# Patient Record
Sex: Female | Born: 1975 | Race: White | Hispanic: No | Marital: Married | Smoking: Former smoker
Health system: Southern US, Community
[De-identification: ages and names within clinical notes are randomized; demographics above are authoritative.]

## PROBLEM LIST (undated history)

## (undated) DIAGNOSIS — E785 Hyperlipidemia, unspecified: Secondary | ICD-10-CM

## (undated) DIAGNOSIS — M159 Polyosteoarthritis, unspecified: Secondary | ICD-10-CM

## (undated) DIAGNOSIS — E039 Hypothyroidism, unspecified: Secondary | ICD-10-CM

## (undated) DIAGNOSIS — E669 Obesity, unspecified: Secondary | ICD-10-CM

## (undated) DIAGNOSIS — F411 Generalized anxiety disorder: Secondary | ICD-10-CM

## (undated) DIAGNOSIS — R112 Nausea with vomiting, unspecified: Secondary | ICD-10-CM

## (undated) DIAGNOSIS — F329 Major depressive disorder, single episode, unspecified: Secondary | ICD-10-CM

## (undated) DIAGNOSIS — J189 Pneumonia, unspecified organism: Secondary | ICD-10-CM

## (undated) DIAGNOSIS — K219 Gastro-esophageal reflux disease without esophagitis: Secondary | ICD-10-CM

## (undated) DIAGNOSIS — T7840XA Allergy, unspecified, initial encounter: Secondary | ICD-10-CM

## (undated) DIAGNOSIS — Z9889 Other specified postprocedural states: Secondary | ICD-10-CM

## (undated) HISTORY — DX: Gastro-esophageal reflux disease without esophagitis: K21.9

## (undated) HISTORY — DX: Hyperlipidemia, unspecified: E78.5

## (undated) HISTORY — DX: Generalized anxiety disorder: F41.1

## (undated) HISTORY — DX: Hypothyroidism, unspecified: E03.9

## (undated) HISTORY — DX: Obesity, unspecified: E66.9

## (undated) HISTORY — DX: Polyosteoarthritis, unspecified: M15.9

## (undated) HISTORY — PX: JOINT REPLACEMENT: SHX530

## (undated) HISTORY — PX: FRACTURE SURGERY: SHX138

## (undated) HISTORY — PX: HERNIA REPAIR: SHX51

## (undated) HISTORY — DX: Allergy, unspecified, initial encounter: T78.40XA

## (undated) HISTORY — DX: Major depressive disorder, single episode, unspecified: F32.9

---

## 1993-01-28 HISTORY — PX: ORIF ULNAR FRACTURE: SHX5417

## 1997-04-26 ENCOUNTER — Other Ambulatory Visit: Admission: RE | Admit: 1997-04-26 | Discharge: 1997-04-26 | Payer: Self-pay | Admitting: Obstetrics and Gynecology

## 1997-05-24 ENCOUNTER — Other Ambulatory Visit: Admission: RE | Admit: 1997-05-24 | Discharge: 1997-05-24 | Payer: Self-pay | Admitting: Obstetrics and Gynecology

## 1997-06-21 ENCOUNTER — Other Ambulatory Visit: Admission: RE | Admit: 1997-06-21 | Discharge: 1997-06-21 | Payer: Self-pay | Admitting: Obstetrics and Gynecology

## 1999-06-20 ENCOUNTER — Other Ambulatory Visit: Admission: RE | Admit: 1999-06-20 | Discharge: 1999-06-20 | Payer: Self-pay | Admitting: Family Medicine

## 2002-09-07 ENCOUNTER — Other Ambulatory Visit: Admission: RE | Admit: 2002-09-07 | Discharge: 2002-09-07 | Payer: Self-pay | Admitting: Family Medicine

## 2003-02-02 ENCOUNTER — Other Ambulatory Visit: Admission: RE | Admit: 2003-02-02 | Discharge: 2003-02-02 | Payer: Self-pay | Admitting: Obstetrics and Gynecology

## 2003-05-05 ENCOUNTER — Other Ambulatory Visit: Admission: RE | Admit: 2003-05-05 | Discharge: 2003-05-05 | Payer: Self-pay | Admitting: Obstetrics and Gynecology

## 2003-07-28 ENCOUNTER — Other Ambulatory Visit: Admission: RE | Admit: 2003-07-28 | Discharge: 2003-07-28 | Payer: Self-pay | Admitting: Obstetrics and Gynecology

## 2004-03-08 ENCOUNTER — Other Ambulatory Visit: Admission: RE | Admit: 2004-03-08 | Discharge: 2004-03-08 | Payer: Self-pay | Admitting: Obstetrics and Gynecology

## 2004-06-21 ENCOUNTER — Other Ambulatory Visit: Admission: RE | Admit: 2004-06-21 | Discharge: 2004-06-21 | Payer: Self-pay | Admitting: Obstetrics and Gynecology

## 2004-11-15 ENCOUNTER — Ambulatory Visit: Payer: Self-pay | Admitting: Internal Medicine

## 2005-02-07 ENCOUNTER — Ambulatory Visit: Payer: Self-pay | Admitting: Internal Medicine

## 2005-03-07 ENCOUNTER — Other Ambulatory Visit: Admission: RE | Admit: 2005-03-07 | Discharge: 2005-03-07 | Payer: Self-pay | Admitting: Obstetrics and Gynecology

## 2005-05-16 ENCOUNTER — Ambulatory Visit: Payer: Self-pay | Admitting: Internal Medicine

## 2005-12-05 ENCOUNTER — Ambulatory Visit: Payer: Self-pay | Admitting: Internal Medicine

## 2005-12-05 LAB — CONVERTED CEMR LAB
Chol/HDL Ratio, serum: 6.5
Cholesterol: 202 mg/dL (ref 0–200)
HDL: 30.9 mg/dL — ABNORMAL LOW (ref >39.0)
LDL DIRECT: 151.7 mg/dL
TSH: 1.96 microintl units/mL (ref 0.35–5.50)
Triglyceride fasting, serum: 101 mg/dL (ref 0–149)
VLDL: 20 mg/dL (ref 0–40)

## 2006-06-05 ENCOUNTER — Ambulatory Visit: Payer: Self-pay | Admitting: Internal Medicine

## 2006-08-14 ENCOUNTER — Ambulatory Visit: Payer: Self-pay | Admitting: Internal Medicine

## 2006-08-18 DIAGNOSIS — E039 Hypothyroidism, unspecified: Secondary | ICD-10-CM

## 2006-08-18 DIAGNOSIS — F411 Generalized anxiety disorder: Secondary | ICD-10-CM | POA: Insufficient documentation

## 2006-08-18 HISTORY — DX: Generalized anxiety disorder: F41.1

## 2006-08-18 HISTORY — DX: Hypothyroidism, unspecified: E03.9

## 2006-12-11 ENCOUNTER — Ambulatory Visit: Payer: Self-pay | Admitting: Internal Medicine

## 2006-12-11 LAB — CONVERTED CEMR LAB
ALT: 24 units/L (ref 0–35)
AST: 19 units/L (ref 0–37)
Albumin: 3.8 g/dL (ref 3.5–5.2)
Alkaline Phosphatase: 77 units/L (ref 39–117)
BUN: 14 mg/dL (ref 6–23)
Basophils Absolute: 0.1 10*3/uL (ref 0.0–0.1)
Basophils Relative: 1 % (ref 0.0–1.0)
Bilirubin Urine: NEGATIVE
Bilirubin, Direct: 0.1 mg/dL (ref 0.0–0.3)
CO2: 28 meq/L (ref 19–32)
Calcium: 9.4 mg/dL (ref 8.4–10.5)
Chloride: 110 meq/L (ref 96–112)
Cholesterol: 201 mg/dL (ref 0–200)
Creatinine, Ser: 0.8 mg/dL (ref 0.4–1.2)
Direct LDL: 151.9 mg/dL
Eosinophils Absolute: 0.4 10*3/uL (ref 0.0–0.6)
Eosinophils Relative: 6.6 % — ABNORMAL HIGH (ref 0.0–5.0)
GFR calc Af Amer: 106 mL/min
GFR calc non Af Amer: 88 mL/min
Glucose, Bld: 100 mg/dL — ABNORMAL HIGH (ref 70–99)
Glucose, Urine, Semiquant: NEGATIVE
HCT: 38.9 % (ref 36.0–46.0)
HDL: 31.5 mg/dL — ABNORMAL LOW (ref >39.0)
Hemoglobin: 13.5 g/dL (ref 12.0–15.0)
Ketones, urine, test strip: NEGATIVE
Lymphocytes Relative: 26.7 % (ref 12.0–46.0)
MCHC: 34.7 g/dL (ref 30.0–36.0)
MCV: 87.6 fL (ref 78.0–100.0)
Monocytes Absolute: 0.5 10*3/uL (ref 0.2–0.7)
Monocytes Relative: 8.4 % (ref 3.0–11.0)
Neutro Abs: 3.8 10*3/uL (ref 1.4–7.7)
Neutrophils Relative %: 57.3 % (ref 43.0–77.0)
Nitrite: NEGATIVE
Platelets: 297 10*3/uL (ref 150–400)
Potassium: 5.1 meq/L (ref 3.5–5.1)
Protein, U semiquant: NEGATIVE
RBC: 4.44 M/uL (ref 3.87–5.11)
RDW: 12.4 % (ref 11.5–14.6)
Sodium: 144 meq/L (ref 135–145)
Specific Gravity, Urine: 1.02
TSH: 4.53 microintl units/mL (ref 0.35–5.50)
Total Bilirubin: 0.8 mg/dL (ref 0.3–1.2)
Total CHOL/HDL Ratio: 6.4
Total Protein: 6.6 g/dL (ref 6.0–8.3)
Triglycerides: 118 mg/dL (ref 0–149)
Urobilinogen, UA: NEGATIVE
VLDL: 24 mg/dL (ref 0–40)
WBC: 6.5 10*3/uL (ref 4.5–10.5)
pH: 7

## 2006-12-18 ENCOUNTER — Ambulatory Visit: Payer: Self-pay | Admitting: Internal Medicine

## 2006-12-23 ENCOUNTER — Ambulatory Visit: Payer: Self-pay | Admitting: Internal Medicine

## 2006-12-31 ENCOUNTER — Telehealth: Payer: Self-pay | Admitting: Internal Medicine

## 2007-01-14 ENCOUNTER — Encounter: Payer: Self-pay | Admitting: Internal Medicine

## 2007-06-11 ENCOUNTER — Ambulatory Visit: Payer: Self-pay | Admitting: Internal Medicine

## 2007-06-11 DIAGNOSIS — E785 Hyperlipidemia, unspecified: Secondary | ICD-10-CM | POA: Insufficient documentation

## 2007-06-11 HISTORY — DX: Hyperlipidemia, unspecified: E78.5

## 2007-06-11 LAB — CONVERTED CEMR LAB
Alkaline Phosphatase: 57 units/L (ref 39–117)
Bilirubin, Direct: 0.1 mg/dL (ref 0.0–0.3)
HDL: 30.2 mg/dL — ABNORMAL LOW (ref >39.0)
Total Bilirubin: 0.9 mg/dL (ref 0.3–1.2)
Total CHOL/HDL Ratio: 6.9
Total Protein: 6.1 g/dL (ref 6.0–8.3)
VLDL: 30 mg/dL (ref 0–40)

## 2007-06-17 ENCOUNTER — Encounter: Admission: RE | Admit: 2007-06-17 | Discharge: 2007-06-17 | Payer: Self-pay | Admitting: Orthopedic Surgery

## 2007-06-18 ENCOUNTER — Ambulatory Visit: Payer: Self-pay | Admitting: Internal Medicine

## 2007-06-25 ENCOUNTER — Encounter: Payer: Self-pay | Admitting: Internal Medicine

## 2007-06-29 LAB — HM PAP SMEAR

## 2007-06-29 LAB — CONVERTED CEMR LAB: Pap Smear: NORMAL

## 2007-12-17 ENCOUNTER — Ambulatory Visit: Payer: Self-pay | Admitting: Internal Medicine

## 2007-12-17 LAB — CONVERTED CEMR LAB
AST: 20 units/L (ref 0–37)
Basophils Absolute: 0.1 10*3/uL (ref 0.0–0.1)
Basophils Relative: 1 % (ref 0.0–3.0)
Bilirubin Urine: NEGATIVE
Bilirubin, Direct: 0.1 mg/dL (ref 0.0–0.3)
Blood in Urine, dipstick: NEGATIVE
CO2: 26 meq/L (ref 19–32)
Calcium: 9.3 mg/dL (ref 8.4–10.5)
Chloride: 108 meq/L (ref 96–112)
Cholesterol: 197 mg/dL (ref 0–200)
Creatinine, Ser: 0.8 mg/dL (ref 0.4–1.2)
Eosinophils Absolute: 0.7 10*3/uL (ref 0.0–0.7)
GFR calc non Af Amer: 87 mL/min
Glucose, Urine, Semiquant: NEGATIVE
HDL: 34.4 mg/dL — ABNORMAL LOW (ref >39.0)
Ketones, urine, test strip: NEGATIVE
LDL Cholesterol: 138 mg/dL — ABNORMAL HIGH (ref 0–99)
Lymphocytes Relative: 26 % (ref 12.0–46.0)
MCHC: 35.1 g/dL (ref 30.0–36.0)
MCV: 87.4 fL (ref 78.0–100.0)
Neutrophils Relative %: 55.3 % (ref 43.0–77.0)
RBC: 4.4 M/uL (ref 3.87–5.11)
RDW: 12.6 % (ref 11.5–14.6)
Sodium: 143 meq/L (ref 135–145)
TSH: 6.52 microintl units/mL — ABNORMAL HIGH (ref 0.35–5.50)
Total Bilirubin: 0.9 mg/dL (ref 0.3–1.2)
Triglycerides: 125 mg/dL (ref 0–149)
Urobilinogen, UA: 0.2
VLDL: 25 mg/dL (ref 0–40)
pH: 7.5

## 2007-12-22 ENCOUNTER — Ambulatory Visit: Payer: Self-pay | Admitting: Internal Medicine

## 2008-06-09 ENCOUNTER — Ambulatory Visit: Payer: Self-pay | Admitting: Internal Medicine

## 2008-06-16 ENCOUNTER — Ambulatory Visit: Payer: Self-pay | Admitting: Internal Medicine

## 2008-06-16 DIAGNOSIS — E669 Obesity, unspecified: Secondary | ICD-10-CM

## 2008-06-16 HISTORY — DX: Obesity, unspecified: E66.9

## 2008-06-23 ENCOUNTER — Encounter: Admission: RE | Admit: 2008-06-23 | Discharge: 2008-06-23 | Payer: Self-pay | Admitting: Internal Medicine

## 2008-08-17 ENCOUNTER — Telehealth: Payer: Self-pay | Admitting: Internal Medicine

## 2008-10-06 ENCOUNTER — Ambulatory Visit: Payer: Self-pay | Admitting: Internal Medicine

## 2008-10-20 ENCOUNTER — Ambulatory Visit: Payer: Self-pay | Admitting: Licensed Clinical Social Worker

## 2008-10-27 ENCOUNTER — Ambulatory Visit: Payer: Self-pay | Admitting: Licensed Clinical Social Worker

## 2008-11-10 ENCOUNTER — Ambulatory Visit: Payer: Self-pay | Admitting: Licensed Clinical Social Worker

## 2008-11-16 ENCOUNTER — Ambulatory Visit: Payer: Self-pay | Admitting: Licensed Clinical Social Worker

## 2008-12-01 ENCOUNTER — Ambulatory Visit: Payer: Self-pay | Admitting: Licensed Clinical Social Worker

## 2008-12-01 ENCOUNTER — Ambulatory Visit: Payer: Self-pay | Admitting: Internal Medicine

## 2008-12-08 ENCOUNTER — Ambulatory Visit: Payer: Self-pay | Admitting: Licensed Clinical Social Worker

## 2008-12-15 ENCOUNTER — Ambulatory Visit: Payer: Self-pay | Admitting: Licensed Clinical Social Worker

## 2008-12-21 ENCOUNTER — Ambulatory Visit: Payer: Self-pay | Admitting: Licensed Clinical Social Worker

## 2009-01-05 ENCOUNTER — Ambulatory Visit: Payer: Self-pay | Admitting: Licensed Clinical Social Worker

## 2009-01-19 ENCOUNTER — Ambulatory Visit: Payer: Self-pay | Admitting: Licensed Clinical Social Worker

## 2009-01-26 ENCOUNTER — Ambulatory Visit: Payer: Self-pay | Admitting: Licensed Clinical Social Worker

## 2009-01-31 ENCOUNTER — Ambulatory Visit: Payer: Self-pay | Admitting: Internal Medicine

## 2009-02-02 ENCOUNTER — Ambulatory Visit: Payer: Self-pay | Admitting: Licensed Clinical Social Worker

## 2009-02-23 ENCOUNTER — Ambulatory Visit: Payer: Self-pay | Admitting: Licensed Clinical Social Worker

## 2009-03-23 ENCOUNTER — Ambulatory Visit: Payer: Self-pay | Admitting: Licensed Clinical Social Worker

## 2009-04-20 ENCOUNTER — Telehealth: Payer: Self-pay | Admitting: Internal Medicine

## 2009-06-01 ENCOUNTER — Ambulatory Visit: Payer: Self-pay | Admitting: Internal Medicine

## 2009-06-06 LAB — CONVERTED CEMR LAB
Basophils Absolute: 0.1 10*3/uL (ref 0.0–0.1)
Cholesterol: 231 mg/dL — ABNORMAL HIGH (ref 0–200)
Eosinophils Absolute: 0.5 10*3/uL (ref 0.0–0.7)
HCT: 41.1 % (ref 36.0–46.0)
HDL: 49.2 mg/dL (ref >39.00)
Hemoglobin: 13.9 g/dL (ref 12.0–15.0)
Lymphs Abs: 1.7 10*3/uL (ref 0.7–4.0)
MCHC: 33.9 g/dL (ref 30.0–36.0)
Monocytes Absolute: 0.7 10*3/uL (ref 0.1–1.0)
Neutro Abs: 5.2 10*3/uL (ref 1.4–7.7)
Platelets: 293 10*3/uL (ref 150.0–400.0)
RDW: 13.7 % (ref 11.5–14.6)
Triglycerides: 166 mg/dL — ABNORMAL HIGH (ref 0.0–149.0)

## 2009-08-10 ENCOUNTER — Encounter: Payer: Self-pay | Admitting: Internal Medicine

## 2009-10-26 ENCOUNTER — Ambulatory Visit: Payer: Self-pay | Admitting: Internal Medicine

## 2009-10-26 DIAGNOSIS — M1990 Unspecified osteoarthritis, unspecified site: Secondary | ICD-10-CM | POA: Insufficient documentation

## 2009-10-26 DIAGNOSIS — M159 Polyosteoarthritis, unspecified: Secondary | ICD-10-CM

## 2009-10-26 HISTORY — DX: Polyosteoarthritis, unspecified: M15.9

## 2009-12-27 ENCOUNTER — Telehealth: Payer: Self-pay | Admitting: Internal Medicine

## 2010-01-04 ENCOUNTER — Ambulatory Visit: Payer: Self-pay | Admitting: Psychology

## 2010-01-11 ENCOUNTER — Ambulatory Visit: Payer: Self-pay | Admitting: Psychology

## 2010-01-16 ENCOUNTER — Ambulatory Visit: Payer: Self-pay | Admitting: Internal Medicine

## 2010-01-25 ENCOUNTER — Ambulatory Visit: Payer: Self-pay | Admitting: Psychology

## 2010-02-08 ENCOUNTER — Ambulatory Visit
Admission: RE | Admit: 2010-02-08 | Discharge: 2010-02-08 | Payer: Self-pay | Source: Home / Self Care | Attending: Psychology | Admitting: Psychology

## 2010-02-15 ENCOUNTER — Ambulatory Visit
Admission: RE | Admit: 2010-02-15 | Discharge: 2010-02-15 | Payer: Self-pay | Source: Home / Self Care | Attending: Psychology | Admitting: Psychology

## 2010-02-22 ENCOUNTER — Ambulatory Visit
Admission: RE | Admit: 2010-02-22 | Discharge: 2010-02-22 | Payer: Self-pay | Source: Home / Self Care | Attending: Internal Medicine | Admitting: Internal Medicine

## 2010-02-22 ENCOUNTER — Ambulatory Visit
Admission: RE | Admit: 2010-02-22 | Discharge: 2010-02-22 | Payer: Self-pay | Source: Home / Self Care | Attending: Psychology | Admitting: Psychology

## 2010-02-22 ENCOUNTER — Other Ambulatory Visit: Payer: Self-pay | Admitting: Internal Medicine

## 2010-02-22 DIAGNOSIS — F3289 Other specified depressive episodes: Secondary | ICD-10-CM

## 2010-02-22 DIAGNOSIS — F32A Depression, unspecified: Secondary | ICD-10-CM | POA: Insufficient documentation

## 2010-02-22 DIAGNOSIS — F329 Major depressive disorder, single episode, unspecified: Secondary | ICD-10-CM | POA: Insufficient documentation

## 2010-02-22 HISTORY — DX: Other specified depressive episodes: F32.89

## 2010-02-22 HISTORY — DX: Major depressive disorder, single episode, unspecified: F32.9

## 2010-02-22 LAB — BASIC METABOLIC PANEL
CO2: 25 mEq/L (ref 19–32)
Calcium: 8.9 mg/dL (ref 8.4–10.5)
Chloride: 108 mEq/L (ref 96–112)
Creatinine, Ser: 0.8 mg/dL (ref 0.4–1.2)
GFR: 84.6 mL/min (ref >60.00)
Glucose, Bld: 84 mg/dL (ref 70–99)
Sodium: 139 mEq/L (ref 135–145)

## 2010-02-22 LAB — HEPATIC FUNCTION PANEL
ALT: 31 U/L (ref 0–35)
AST: 22 U/L (ref 0–37)
Alkaline Phosphatase: 72 U/L (ref 39–117)

## 2010-02-22 LAB — LIPID PANEL
Cholesterol: 203 mg/dL — ABNORMAL HIGH (ref 0–200)
VLDL: 35 mg/dL (ref 0.0–40.0)

## 2010-02-22 LAB — TSH: TSH: 1.66 u[IU]/mL (ref 0.35–5.50)

## 2010-02-27 NOTE — Assessment & Plan Note (Signed)
Summary: 3 month fup//ccm----PT RSC (BMP) // RS   Vital Signs:  Patient profile:   35 year old female Temp:     98.6 degrees F oral Pulse rate:   86 / minute Pulse rhythm:   regular Resp:     20 per minute BP sitting:   152 / 80  Vitals Entered By: Lynann Beaver CMA (October 26, 2009 9:21 AM)  History of Present Illness:  Follow-Up Visit      This is a 35 year old woman who presents for Follow-up visit.  The patient denies chest pain and palpitations.  Since the last visit the patient notes no new problems or concerns.  The patient reports taking meds as prescribed.  When questioned about possible medication side effects, the patient notes none.    All other systems reviewed and were negative   Current Problems (verified): 1)  Obesity  (ICD-278.00) 2)  Hyperlipidemia  (ICD-272.4) 3)  Physical Examination  (ICD-V70.0) 4)  Hypothyroidism  (ICD-244.9) 5)  Anxiety  (ICD-300.00)  Current Medications (verified): 1)  Citalopram Hydrobromide 40 Mg Tabs (Citalopram Hydrobromide) .... One By Mouth Daily 2)  Levothroid 112 Mcg Tabs (Levothyroxine Sodium) .... 2 By Mouth Once Daily 3)  Multivitamins   Tabs (Multiple Vitamin) .... Once Daily 4)  Alavert 10 Mg  Tabs (Loratadine) .... As Needed 5)  Celebrex 200 Mg Caps (Celecoxib) .... One By Mouth Bid  Allergies (verified): 1)  ! * Aspirin (Aspirin)  Past History:  Past Medical History: Last updated: 08/18/2006 Anxiety, mood disorder Hypothyroidism  Past Surgical History: Last updated: 08/18/2006 arm fx  ORIF 1995  Family History: Last updated: 12/18/2006 CAD Mother-personality disorder ? schizophrenia.   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   Head:  normocephalic and atraumatic.   Eyes:   pupils equal and pupils round.   Ears:  R ear normal and L ear normal.   Neck:  No deformities, masses, or tenderness noted. Chest Wall:  No deformities, masses, or tenderness noted. Heart:  normal rate and regular rhythm.   Abdomen:  soft and non-tender.  obese Skin:  turgor normal and color normal.   Psych:  good eye contact and not anxious appearing.     Impression & Recommendations:  Problem # 1:  HYPOTHYROIDISM (ICD-244.9) adequately controlled. Her updated medication list for this problem includes:    Levothroid 112 Mcg Tabs (Levothyroxine sodium) .Marland Kitchen... 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 One by mouth bid Her updated medication list for this problem includes:    Meloxicam 7.5 Mg Tabs (Meloxicam) ..... One by mouth daily with food  Complete Medication List: 1)  Citalopram Hydrobromide  40 Mg Tabs (Citalopram hydrobromide) .... One by mouth daily 2)  Levothroid 112 Mcg Tabs (Levothyroxine sodium) .... 2 by mouth once daily 3)  Multivitamins Tabs (Multiple vitamin) .... Once daily 4)  Alavert 10 Mg Tabs (Loratadine) .... As needed 5)  Meloxicam 7.5 Mg Tabs (Meloxicam) .... One by mouth daily with food  Other Orders: Psychology Referral  (Psychology)  Patient Instructions: 1)  Please schedule a follow-up appointment in 4 months. Prescriptions: MELOXICAM 7.5 MG  TABS (MELOXICAM) one by mouth daily with food  #30 x 6   Entered and Authorized by:   Birdie Sons MD   Signed by:   Birdie Sons MD on 10/26/2009   Method used:   Electronically to        [facility] [phone]* (retail)       [address]       [place], [place]  [address]       Ph: [phone]       Fax: [phone]   RxID:   [id number]

## 2010-02-27 NOTE — Letter (Signed)
Summary: [facility]   Imported By: Maryln Gottron 08/18/2009 12:09:22  _____________________________________________________________________  External Attachment:    Type:   Image     Comment:   External Document

## 2010-02-27 NOTE — Assessment & Plan Note (Signed)
Summary: 2 month rov/njr---PT RSC (BMP) // RS/PT RSC/CJR---PT [facility] // RS   Vital Signs:  Patient profile:   35 year old female Weight:      350 pounds BMI:     55.85 Temp:     98 degrees F oral Pulse rate:   60 / minute Pulse rhythm:   regular Resp:     16 per minute BP sitting:   118 / 86  (left arm) Cuff size:   large  Vitals Entered By: Gladis Riffle, RN (Jun 01, 2009 8:52 AM) CC: 2 month rov, had to quit weight watchers due to cost Is Patient Diabetic? No   CC:  2 month rov and had to quit weight watchers due to cost.  History of Present Illness:  Follow-Up Visit      This is a 35 year old woman who presents for Follow-up visit.  The patient denies chest pain and palpitations.  Since the last visit the patient notes no new problems or concerns.  The patient reports taking meds as prescribed.  When questioned about possible medication side effects, the patient notes none.  Continues to gain weight.  All other systems reviewed and were negative   Preventive Screening-Counseling & Management  Alcohol-Tobacco     Smoking Status: quit > 6 months     Year Started: 1993     Year Quit: 2004  Current Problems (verified): 1)  Obesity  (ICD-278.00) 2)  Hyperlipidemia  (ICD-272.4) 3)  Physical Examination  (ICD-V70.0) 4)  Hypothyroidism  (ICD-244.9) 5)  Anxiety  (ICD-300.00)  Current Medications (verified): 1)  Citalopram Hydrobromide 40 Mg Tabs (Citalopram Hydrobromide) .... One By Mouth Daily 2)  Levothroid 112 Mcg Tabs (Levothyroxine Sodium) .... 2 By Mouth Once Daily 3)  Multivitamins   Tabs (Multiple Vitamin) .... Once Daily 4)  Alavert 10 Mg  Tabs (Loratadine) .... As Needed 5)  Celebrex 200 Mg Caps (Celecoxib) .... One By Mouth Bid 6)  Phentermine Hcl 30 Mg Caps (Phentermine Hcl) .... Take 1 Tablet By Mouth Once A Day--Make Office Visit For May--No More Refills Until Seen  Allergies: 1)  ! * Aspirin (Aspirin)  Past History:  Past Medical History: Last updated:  08/18/2006 Anxiety, mood disorder Hypothyroidism  Past Surgical History: Last updated: 08/18/2006 arm fx  ORIF 1995  Family History: Last updated: 12/18/2006 CAD Mother-personality disorder ? schizophrenia.   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   Head:  normocephalic and atraumatic.   Eyes:  pupils equal and pupils round.   Ears:  R ear normal and L ear normal.   Neck:  No deformities, masses, or tenderness noted. Chest Wall:  No deformities, masses, or tenderness noted. Lungs:  normal respiratory effort and no intercostal retractions.   Heart:  Normal rate and regular rhythm. S1 and S2 normal without gallop, murmur, click, rub or other extra sounds. Abdomen:  soft and non-tender.  obese Msk:  No deformity or scoliosis noted of thoracic or lumbar spine.   Neurologic:  cranial nerves II-XII intact and gait normal.   Skin:  turgor normal and color normal.   Psych:  normally interactive and good eye contact.     Impression & Recommendations:  Problem # 1:  OBESITY (ICD-278.00)  this is her most significant problem diet, exercise are key she struggles  with late night eating  Orders: Venipuncture (62130)  Problem # 2:  HYPERLIPIDEMIA (ICD-272.4)  this is mostly an hdl problem will need f/u Labs Reviewed: SGOT: 20 (12/17/2007)   SGPT: 29 (12/17/2007)   HDL:34.4 (12/17/2007), 30.2 (06/11/2007)  LDL:138 (12/17/2007), DEL (06/11/2007)  Chol:197 (12/17/2007), 207 (06/11/2007)  Trig:125 (12/17/2007), 148 (06/11/2007)  Orders: Venipuncture (86578) TLB-Lipid Panel (80061-LIPID)  Problem # 3:  HYPOTHYROIDISM (ICD-244.9)  needs f/u Her updated medication list for this problem includes:    Levothroid 112 Mcg Tabs (Levothyroxine sodium) .Marland Kitchen... 2 by  mouth once daily  Labs Reviewed: TSH: 2.51 (06/09/2008)    Chol: 197 (12/17/2007)   HDL: 34.4 (12/17/2007)   LDL: 138 (12/17/2007)   TG: 125 (12/17/2007)  Orders: Venipuncture (46962) TLB-TSH (Thyroid Stimulating Hormone) (84443-TSH) TLB-CBC Platelet - w/Differential (85025-CBCD)  Problem # 4:  ANXIETY (ICD-300.00) refilled meds.  Her updated medication list for this problem includes:    Citalopram Hydrobromide 40 Mg Tabs (Citalopram hydrobromide) ..... One by mouth daily  Discussed medication use and relaxation techniques.   Complete Medication List: 1)  Citalopram Hydrobromide 40 Mg Tabs (Citalopram hydrobromide) .... One by mouth daily 2)  Levothroid 112 Mcg Tabs (Levothyroxine sodium) .... 2 by mouth once daily 3)  Multivitamins Tabs (Multiple vitamin) .... Once daily 4)  Alavert 10 Mg Tabs (Loratadine) .... As needed 5)  Celebrex 200 Mg Caps (Celecoxib) .... One by mouth bid 6)  Phentermine Hcl 30 Mg Caps (Phentermine hcl) .... Take 1 tablet by mouth once a day--make office visit for may--no more refills until seen  Patient Instructions: 1)  Please schedule a follow-up appointment in 3 months. Prescriptions: CELEBREX 200 MG CAPS (CELECOXIB) one by mouth bid  #60.0 Each x 4   Entered and Authorized by:   Birdie Sons MD   Signed by:   Birdie Sons MD on 06/01/2009   Method used:   Electronically to        [facility] [phone]* (retail)       [address]       [place], [place]  [address]       Ph: [phone]       Fax: [phone]   RxID:   [id number] LEVOTHROID 112 MCG TABS (LEVOTHYROXINE SODIUM) 2 by mouth once daily  #180 x 3   Entered and Authorized by:   Birdie Sons MD   Signed by:   Birdie Sons MD on 06/01/2009   Method used:   Electronically to        [facility] [phone]* (retail)       [address]       [place], [place]  [address]       Ph: [phone]       Fax: [phone]   RxID:   [id number] CITALOPRAM HYDROBROMIDE 40 MG  TABS (CITALOPRAM HYDROBROMIDE) one by mouth daily  #90 x 3   Entered and Authorized by:   Birdie Sons MD   Signed by:   Birdie Sons MD on 06/01/2009   Method used:   Electronically to        [facility] [phone]* (retail)       [address]       [place], [place]  [address]       Ph: [phone]       Fax: [phone]   RxID:   [id number]

## 2010-02-27 NOTE — Progress Notes (Signed)
Summary: refills  Phone Note From Pharmacy   Summary of Call: patient is requesting a refill of phentermine and citalopram is this okay to fill? Initial call taken by: Kern Reap CMA Duncan Dull),  April 20, 2009 11:48 AM  Follow-up for Phone Call        ok Follow-up by: Birdie Sons MD,  April 20, 2009 4:02 PM  Additional Follow-up for Phone Call Additional follow up Details #1::        Pharmacist called Additional Follow-up by: Kern Reap CMA Duncan Dull),  April 20, 2009 4:28 PM

## 2010-02-27 NOTE — Assessment & Plan Note (Signed)
Summary: 6 wk rov/njr   Vital Signs:  Patient profile:   35 year old female Weight:      328 pounds Temp:     98.2 degrees F oral BP sitting:   110 / 76  (left arm) Cuff size:   large  Vitals Entered By: Kern Reap CMA Duncan Dull) (January 31, 2009 11:53 AM)  Reason for Visit follow up with weight  History of Present Illness:  Follow-Up Visit      This is a 35 year old woman who presents for Follow-up visit.  The patient denies chest pain, palpitations, dizziness, syncope, edema, SOB, DOE, PND, and orthopnea.  Since the last visit the patient notes no new problems or concerns.  The patient reports taking meds as prescribed.  When questioned about possible medication side effects, the patient notes none.    All other systems reviewed and were negative   Current Problems (verified): 1)  Obesity  (ICD-278.00) 2)  Hyperlipidemia  (ICD-272.4) 3)  Physical Examination  (ICD-V70.0) 4)  Hypothyroidism  (ICD-244.9) 5)  Anxiety  (ICD-300.00)  Current Medications (verified): 1)  Citalopram Hydrobromide 40 Mg Tabs (Citalopram Hydrobromide) .... One By Mouth Daily 2)  Levothroid 112 Mcg Tabs (Levothyroxine Sodium) .... 2 By Mouth Once Daily 3)  Multivitamins   Tabs (Multiple Vitamin) .... Once Daily 4)  Alavert 10 Mg  Tabs (Loratadine) .... As Needed 5)  Fish Oil 1000 Mg  Caps (Omega-3 Fatty Acids) .... 2 Once Daily 6)  Osteo Bi-Flex Adv Joint Shield   Tabs (Misc Natural Products) .... 2 Once Daily 7)  Celebrex 200 Mg Caps (Celecoxib) .... One By Mouth Bid 8)  Phentermine Hcl 30 Mg Caps (Phentermine Hcl) .... Take 1 Tablet By Mouth Once A Day  Allergies (verified): 1)  ! * Aspirin (Aspirin)  Past History:  Past Medical History: Last updated: 08/18/2006 Anxiety, mood disorder Hypothyroidism  Past Surgical History: Last updated: 08/18/2006 arm fx  ORIF 1995  Family History: Last updated: 12/18/2006 CAD Mother-personality disorder ? schizophrenia.   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   Eyes:  pupils equal and pupils round.   Ears:  R ear normal and L ear normal.   Neck:  No deformities, masses, or tenderness noted. Chest Wall:  No deformities, masses, or tenderness noted. Lungs:  Normal respiratory effort, chest expands symmetrically. Lungs are clear to auscultation, no crackles or wheezes. Abdomen:  Bowel sounds positive,abdomen soft and non-tender without masses, organomegaly or hernias noted. morbidly obese Msk:  No deformity or scoliosis noted of thoracic or lumbar spine.   Neurologic:  cranial nerves II-XII intact and gait normal.   Skin:  Intact without suspicious lesions or rashes Psych:  normally interactive and good eye contact.     Impression & Recommendations:  Problem # 1:  OBESITY (ICD-278.00) she has lost some weight continue phentermine  Problem # 2:  HYPOTHYROIDISM (ICD-244.9) controlled continue current medications  Her updated medication list for this problem includes:    Levothroid 112 Mcg Tabs (Levothyroxine sodium) .Marland Kitchen... 2 by mouth once daily  Labs Reviewed: TSH: 2.51 (06/09/2008)    Chol: 197 (12/17/2007)  HDL: 34.4 (12/17/2007)   LDL: 138 (12/17/2007)   TG: 125 (12/17/2007)  Problem # 3:  HYPERLIPIDEMIA (ICD-272.4) note HDL Labs Reviewed: SGOT: 20 (12/17/2007)   SGPT: 29 (12/17/2007)   HDL:34.4 (12/17/2007), 30.2 (06/11/2007)  LDL:138 (12/17/2007), DEL (06/11/2007)  Chol:197 (12/17/2007), 207 (06/11/2007)  Trig:125 (12/17/2007), 148 (06/11/2007)  Complete Medication List: 1)  Citalopram  Hydrobromide 40 Mg Tabs (Citalopram hydrobromide) .... One by mouth daily 2)  Levothroid 112 Mcg Tabs (Levothyroxine sodium) .... 2 by mouth once daily 3)  Multivitamins Tabs (Multiple vitamin) .... Once daily 4)  Alavert 10 Mg Tabs (Loratadine) .... As needed 5)  Fish Oil 1000 Mg Caps (Omega-3 fatty acids) .... 2 once daily 6)  Osteo Bi-flex Adv Joint Shield Tabs (Misc natural products) .... 2 once daily 7)  Celebrex 200 Mg Caps (Celecoxib) .... One by mouth bid 8)  Phentermine Hcl 30 Mg Caps (Phentermine hcl) .... Take 1 tablet by mouth once a day  Patient Instructions: 1)  Please schedule a follow-up appointment in 2 months.

## 2010-02-27 NOTE — Progress Notes (Signed)
Summary: need status  Phone Note Call from Patient Call back at [facility] Phone [phone] Call back at Work Phone [phone]   Caller: Patient---triage vm Summary of Call: checking on status of getting help for her eating disorder. was told to call today. Initial call taken by: Warnell Forester,  December 27, 2009 9:01 AM  Follow-up for Phone Call        [facility] Can call Victorino Dike at [phone] Follow-up by: Alfred Levins, CMA,  December 27, 2009 10:56 AM  Additional Follow-up for Phone Call Additional follow up Details #1::        pt will contact Victorino Dike Additional Follow-up by: Alfred Levins, CMA,  December 28, 2009 9:38 AM

## 2010-03-01 ENCOUNTER — Ambulatory Visit (INDEPENDENT_AMBULATORY_CARE_PROVIDER_SITE_OTHER): Payer: 59 | Admitting: Psychology

## 2010-03-01 DIAGNOSIS — F331 Major depressive disorder, recurrent, moderate: Secondary | ICD-10-CM

## 2010-03-01 NOTE — Assessment & Plan Note (Signed)
Summary: consult re: meds per Dr. Dutch Gray   Vital Signs:  Patient profile:   35 year old female Temp:     98.9 degrees F oral Pulse rate:   92 / minute BP sitting:   130 / 90  (left arm) Cuff size:   large  Vitals Entered By: Alfred Levins, CMA (January 16, 2010 10:34 AM) CC: discuss meds   CC:  discuss meds.  Current Medications (verified): 1)  Citalopram Hydrobromide 40 Mg Tabs (Citalopram Hydrobromide) .... One By Mouth Daily 2)  Levothroid 112 Mcg Tabs (Levothyroxine Sodium) .... 2 By Mouth Once Daily 3)  Multivitamins   Tabs (Multiple Vitamin) .... Once Daily 4)  Alavert 10 Mg  Tabs (Loratadine) .... As Needed 5)  Meloxicam 7.5 Mg  Tabs (Meloxicam) .... One By Mouth Daily With Food  Allergies (verified): 1)  ! * Aspirin (Aspirin)  Physical Exam  General:  morbidly obese female in no acute distress. HEENT exam atraumatic, normocephalic, cardiac exam S1-S2 are regular. Abdominal exam overweight, to bowel sounds affect is flat.   Impression & Recommendations:  Problem # 1:  ANXIETY (ICD-300.00) also has depression meds not working  change to effexor---side effects discussed The following medications were removed from the medication list:    Citalopram Hydrobromide 40 Mg Tabs (Citalopram hydrobromide) ..... One by mouth daily Her updated medication list for this problem includes:    Venlafaxine Hcl 75 Mg Xr24h-cap (Venlafaxine hcl) .Marland Kitchen... Take 1 tablet by mouth once a day  Complete Medication List: 1)  Levothroid 112 Mcg Tabs (Levothyroxine sodium) .... 2 by mouth once daily 2)  Multivitamins Tabs (Multiple vitamin) .... Once daily 3)  Alavert 10 Mg Tabs (Loratadine) .... As needed 4)  Meloxicam 7.5 Mg Tabs (Meloxicam) .... One by mouth daily with food 5)  Venlafaxine Hcl 75 Mg Xr24h-cap (Venlafaxine hcl) .... Take 1 tablet by mouth once a day  Patient Instructions: 1)  Please schedule a follow-up appointment in 1 month. Prescriptions: VENLAFAXINE HCL 75 MG  XR24H-CAP (VENLAFAXINE HCL) Take 1 tablet by mouth once a day  #30 x 3   Entered and Authorized by:   Birdie Sons MD   Signed by:   Birdie Sons MD on 01/16/2010   Method used:   Electronically to        [facility] [phone]* (retail)       [address]       [place], [place]  [address]       Ph: [phone]       Fax: [phone]   RxID:   [id number] VENLAFAXINE HCL 75 MG XR24H-CAP (VENLAFAXINE HCL) Take 1 tablet by mouth once a day  #30 x 3   Entered and Authorized by:   Birdie Sons MD   Signed by:   Birdie Sons MD on 01/16/2010   Method used:   Electronically to        [facility] [address] [phone]* (retail)       [address]       [place], [place]  [address]       Ph: [phone] or [phone]       Fax: [phone]   RxID:   [id number]    Orders Added: 1)  Est. Patient Level III [02542]

## 2010-03-01 NOTE — Assessment & Plan Note (Signed)
Summary: 4 MTH ROV // RS   Vital Signs:  Patient profile:   35 year old female Weight:      374 pounds Temp:     98.4 degrees F oral Pulse rate:   84 / minute Pulse rhythm:   regular BP sitting:   122 / 88  (left arm) Cuff size:   large  Vitals Entered By: Alfred Levins, CMA (February 22, 2010 9:02 AM) CC: f/u on meds   CC:  f/u on meds.  History of Present Illness: mood much better--- seeing dr Dellia Cloud  some weight loss 7 pounds in the past week!  hypothyroid---tolerating replacement  Current Medications (verified): 1)  Levothroid 112 Mcg Tabs (Levothyroxine Sodium) .... 2 By Mouth Once Daily 2)  Multivitamins   Tabs (Multiple Vitamin) .... Once Daily 3)  Alavert 10 Mg  Tabs (Loratadine) .... As Needed 4)  Meloxicam 7.5 Mg  Tabs (Meloxicam) .... One By Mouth Daily With Food 5)  Venlafaxine Hcl 75 Mg Xr24h-Cap (Venlafaxine Hcl) .... Take 1 Tablet By Mouth Once A Day  Allergies (verified): 1)  ! * Aspirin (Aspirin)  Past History:  Past Medical History: Anxiety, mood disorder Hypothyroidism Depression  Physical Exam  General:  alert and well-developed.   Head:  normocephalic and atraumatic.   Eyes:  pupils equal and pupils round.   Neck:  No deformities, masses, or tenderness noted. Lungs:  normal respiratory effort and no intercostal retractions.   Heart:  normal rate and regular rhythm.   Abdomen:  soft and non-tender.  obese Skin:  turgor normal and color normal.     Impression & Recommendations:  Problem # 1:  OBESITY (ICD-278.00)  doing much better  Orders: TLB-Hepatic/Liver Function Pnl (80076-HEPATIC)  Problem # 2:  DEPRESSION (ICD-311) much improved Her updated medication list for this problem includes:    Venlafaxine Hcl 75 Mg Xr24h-cap (Venlafaxine hcl) .Marland Kitchen... Take 1 tablet by mouth once a day  Problem # 3:  ANXIETY (ICD-300.00) improved Her updated medication list for this problem includes:    Venlafaxine Hcl 75 Mg Xr24h-cap (Venlafaxine  hcl) .Marland Kitchen... Take 1 tablet by mouth once a day  Complete Medication List: 1)  Levothroid 112 Mcg Tabs (Levothyroxine sodium) .... 2 by mouth once daily 2)  Multivitamins Tabs (Multiple vitamin) .... Once daily 3)  Alavert 10 Mg Tabs (Loratadine) .... As needed 4)  Meloxicam 7.5 Mg Tabs (Meloxicam) .... One by mouth daily with food 5)  Venlafaxine Hcl 75 Mg Xr24h-cap (Venlafaxine hcl) .... Take 1 tablet by mouth once a day  Other Orders: Venipuncture ([address]) TLB-Lipid Panel (80061-LIPID) TLB-TSH (Thyroid Stimulating Hormone) (84443-TSH) TLB-BMP (Basic Metabolic Panel-BMET) (80048-METABOL)  Patient Instructions: 1)  Please schedule a follow-up appointment in 1 month.   Orders Added: 1)  Est. Patient Level IV [60454] 2)  Venipuncture [36415] 3)  TLB-Lipid Panel [80061-LIPID] 4)  TLB-Hepatic/Liver Function Pnl [80076-HEPATIC] 5)  TLB-TSH (Thyroid Stimulating Hormone) [84443-TSH] 6)  TLB-BMP (Basic Metabolic Panel-BMET) [80048-METABOL]  Appended Document: Orders Update    Clinical Lists Changes  Orders: Added new Service order of Specimen Handling ([address]) - Signed

## 2010-03-08 ENCOUNTER — Ambulatory Visit (INDEPENDENT_AMBULATORY_CARE_PROVIDER_SITE_OTHER): Payer: 59 | Admitting: Psychology

## 2010-03-08 DIAGNOSIS — F331 Major depressive disorder, recurrent, moderate: Secondary | ICD-10-CM

## 2010-03-22 ENCOUNTER — Ambulatory Visit: Payer: 59 | Admitting: Psychology

## 2010-03-23 ENCOUNTER — Encounter: Payer: Self-pay | Admitting: Internal Medicine

## 2010-03-29 ENCOUNTER — Encounter: Payer: Self-pay | Admitting: Internal Medicine

## 2010-03-29 ENCOUNTER — Ambulatory Visit (INDEPENDENT_AMBULATORY_CARE_PROVIDER_SITE_OTHER): Payer: 59 | Admitting: Internal Medicine

## 2010-03-29 DIAGNOSIS — F3289 Other specified depressive episodes: Secondary | ICD-10-CM

## 2010-03-29 DIAGNOSIS — E669 Obesity, unspecified: Secondary | ICD-10-CM

## 2010-03-29 DIAGNOSIS — F329 Major depressive disorder, single episode, unspecified: Secondary | ICD-10-CM

## 2010-03-29 NOTE — Progress Notes (Signed)
  Subjective:    Patient ID: Kara [name], female    DOB: November 07, 1975, 35 y.o.   MRN: [id number]  HPI  Weight: she has started weight watchers  Past Medical History  Diagnosis Date  . ANXIETY 08/18/2006  . 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 DEPRESSION 02/22/2010  . HYPERLIPIDEMIA 06/11/2007  . HYPOTHYROIDISM 08/18/2006  . OBESITY 06/16/2008   Past Surgical History  Procedure Date  . Orif ulnar fracture     reports that she has quit smoking. She does not have any smokeless tobacco history on file. Her alcohol and drug histories not on file. family history includes Schizophrenia in her mother.    No Known Allergies   Review of Systems  patient denies chest pain, shortness of breath, orthopnea. Denies lower extremity edema, abdominal pain, change in appetite, change in bowel movements. Patient denies rashes, musculoskeletal complaints. No other specific complaints in a complete review of systems.      Objective:   Physical Exam  Well-developed well-nourished female in no acute distress. HEENT exam atraumatic, normocephalic, extraocular muscles are intact. Neck is supple. No jugular venous distention no thyromegaly. Chest clear to auscultation without increased work of breathing. Cardiac exam S1 and S2 are regular. Abdominal exam active bowel sounds, soft, nontender, obese. Extremities no edema. Neurologic exam she is alert without any motor sensory deficits. Gait is normal.        Assessment & Plan:

## 2010-03-29 NOTE — Assessment & Plan Note (Signed)
She has started weight watchers Encouraged continued participation

## 2010-03-29 NOTE — Assessment & Plan Note (Signed)
Much better on curent meds See me 6 weeks

## 2010-04-05 ENCOUNTER — Ambulatory Visit: Payer: 59 | Admitting: Psychology

## 2010-04-12 ENCOUNTER — Ambulatory Visit (INDEPENDENT_AMBULATORY_CARE_PROVIDER_SITE_OTHER): Payer: 59 | Admitting: Psychology

## 2010-04-12 DIAGNOSIS — F331 Major depressive disorder, recurrent, moderate: Secondary | ICD-10-CM

## 2010-04-19 ENCOUNTER — Ambulatory Visit (INDEPENDENT_AMBULATORY_CARE_PROVIDER_SITE_OTHER): Payer: 59 | Admitting: Psychology

## 2010-04-19 DIAGNOSIS — F331 Major depressive disorder, recurrent, moderate: Secondary | ICD-10-CM

## 2010-04-26 ENCOUNTER — Ambulatory Visit (INDEPENDENT_AMBULATORY_CARE_PROVIDER_SITE_OTHER): Payer: 59 | Admitting: Psychology

## 2010-04-26 DIAGNOSIS — F331 Major depressive disorder, recurrent, moderate: Secondary | ICD-10-CM

## 2010-05-10 ENCOUNTER — Encounter: Payer: Self-pay | Admitting: Internal Medicine

## 2010-05-10 ENCOUNTER — Ambulatory Visit (INDEPENDENT_AMBULATORY_CARE_PROVIDER_SITE_OTHER): Payer: 59 | Admitting: Psychology

## 2010-05-10 ENCOUNTER — Ambulatory Visit (INDEPENDENT_AMBULATORY_CARE_PROVIDER_SITE_OTHER): Payer: 59 | Admitting: Internal Medicine

## 2010-05-10 DIAGNOSIS — E669 Obesity, unspecified: Secondary | ICD-10-CM

## 2010-05-10 DIAGNOSIS — F331 Major depressive disorder, recurrent, moderate: Secondary | ICD-10-CM

## 2010-05-10 MED ORDER — TRAMADOL HCL 50 MG PO TABS: 50.0000 mg | ORAL_TABLET | Freq: Two times a day (BID) | ORAL | Status: DC | PRN

## 2010-05-10 MED ORDER — MELOXICAM 7.5 MG PO TABS: 7.5000 mg | ORAL_TABLET | Freq: Two times a day (BID) | ORAL | Status: DC

## 2010-05-10 NOTE — Assessment & Plan Note (Signed)
This remains her primary problem. She has lost some weight. I've encouraged her to continue Weight Watchers. I'll see her back in 6 weeks. I've encouraged her to lose 10 pounds within that time. I've advised her to start exercising daily.

## 2010-05-10 NOTE — Progress Notes (Signed)
  Subjective:    Patient ID: Kara [name], female    DOB: 1975/09/06, 35 y.o.   MRN: [id number]  HPI  Weight watchers: has had some success--reviewed flow sheet 14 pound weight loss  Right knee pain---has seen ortho  Mood--tolerating meds  Past Medical History  Diagnosis Date  . ANXIETY 08/18/2006  . 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 DEPRESSION 02/22/2010  . HYPERLIPIDEMIA 06/11/2007  . HYPOTHYROIDISM 08/18/2006  . OBESITY 06/16/2008   Past Surgical History  Procedure Date  . Orif ulnar fracture     reports that she quit smoking about 5 years ago. Her smoking use included Cigarettes. She does not have any smokeless tobacco history on file. Her alcohol and drug histories not on file. family history includes Schizophrenia in her mother. No Known Allergies   Review of Systems  patient denies chest pain, shortness of breath, orthopnea. Denies lower extremity edema, abdominal pain, change in appetite, change in bowel movements. Patient denies rashes, musculoskeletal complaints. No other specific complaints in a complete review of systems.      Objective:   Physical Exam Obese female in no acute distress. Neck is supple without lymphadenopathy chest her auscultation cardiac exam S1-S2 irregular. Extremities no edema.       Assessment & Plan:

## 2010-05-24 ENCOUNTER — Ambulatory Visit (INDEPENDENT_AMBULATORY_CARE_PROVIDER_SITE_OTHER): Payer: 59 | Admitting: Psychology

## 2010-05-24 DIAGNOSIS — F331 Major depressive disorder, recurrent, moderate: Secondary | ICD-10-CM

## 2010-06-07 ENCOUNTER — Ambulatory Visit (INDEPENDENT_AMBULATORY_CARE_PROVIDER_SITE_OTHER): Payer: 59 | Admitting: Psychology

## 2010-06-07 DIAGNOSIS — F331 Major depressive disorder, recurrent, moderate: Secondary | ICD-10-CM

## 2010-06-21 ENCOUNTER — Ambulatory Visit (INDEPENDENT_AMBULATORY_CARE_PROVIDER_SITE_OTHER): Payer: 59 | Admitting: Psychology

## 2010-06-21 DIAGNOSIS — F331 Major depressive disorder, recurrent, moderate: Secondary | ICD-10-CM

## 2010-06-28 ENCOUNTER — Ambulatory Visit (INDEPENDENT_AMBULATORY_CARE_PROVIDER_SITE_OTHER): Payer: 59 | Admitting: Internal Medicine

## 2010-06-28 ENCOUNTER — Encounter: Payer: Self-pay | Admitting: Internal Medicine

## 2010-06-28 VITALS — BP 128/94 | HR 96 | Temp 98.4°F | Wt 345.0 lb

## 2010-06-28 DIAGNOSIS — E669 Obesity, unspecified: Secondary | ICD-10-CM

## 2010-06-28 NOTE — Progress Notes (Signed)
  Subjective:    Patient ID: Kara [name], female    DOB: 23-Jan-1976, 35 y.o.   MRN: [id number]  HPI    Review of Systems     Objective:   Physical Exam        Assessment & Plan:  Obesity: she is making significant progress with weigh tloss Encouraged exercise at least every other day 10 minutes---all counselling

## 2010-07-24 ENCOUNTER — Other Ambulatory Visit: Payer: Self-pay | Admitting: Internal Medicine

## 2010-08-12 ENCOUNTER — Other Ambulatory Visit: Payer: Self-pay | Admitting: Internal Medicine

## 2010-09-26 ENCOUNTER — Other Ambulatory Visit: Payer: Self-pay | Admitting: Internal Medicine

## 2010-09-26 MED ORDER — MELOXICAM 7.5 MG PO TABS: 7.5000 mg | ORAL_TABLET | Freq: Two times a day (BID) | ORAL | Status: DC

## 2010-09-26 NOTE — Telephone Encounter (Signed)
rx sent in electronically 

## 2010-09-26 NOTE — Telephone Encounter (Signed)
Pt called and is needing refills of Meloxicam 7.5 mg and Tramadol 50 mg to [facility] on [address]

## 2010-09-28 ENCOUNTER — Ambulatory Visit: Payer: 59 | Admitting: Internal Medicine

## 2010-11-08 ENCOUNTER — Ambulatory Visit (INDEPENDENT_AMBULATORY_CARE_PROVIDER_SITE_OTHER): Payer: 59 | Admitting: Internal Medicine

## 2010-11-08 ENCOUNTER — Encounter: Payer: Self-pay | Admitting: Internal Medicine

## 2010-11-08 VITALS — BP 124/92 | HR 72 | Temp 98.2°F | Ht 67.0 in | Wt 337.0 lb

## 2010-11-08 DIAGNOSIS — E669 Obesity, unspecified: Secondary | ICD-10-CM

## 2010-11-08 DIAGNOSIS — Z23 Encounter for immunization: Secondary | ICD-10-CM

## 2010-11-08 MED ORDER — TRAMADOL HCL 50 MG PO TABS: 25.0000 mg | ORAL_TABLET | Freq: Two times a day (BID) | ORAL | Status: DC | PRN

## 2010-11-08 MED ORDER — MELOXICAM 7.5 MG PO TABS: 7.5000 mg | ORAL_TABLET | Freq: Two times a day (BID) | ORAL | Status: DC | PRN

## 2010-11-08 NOTE — Progress Notes (Signed)
  Subjective:    Patient ID: Kara [name], female    DOB: 10-17-1975, 35 y.o.   MRN: [id number]  HPI Weight: has been doing well , weigh twatchers.  Has some arthritic pain.---followed by ortho.  Uses tramadol prn    Past Medical History  Diagnosis Date  . ANXIETY 08/18/2006  . 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 DEPRESSION 02/22/2010  . HYPERLIPIDEMIA 06/11/2007  . HYPOTHYROIDISM 08/18/2006  . OBESITY 06/16/2008   Past Surgical History  Procedure Date  . Orif ulnar fracture     reports that she quit smoking about 5 years ago. Her smoking use included Cigarettes. She does not have any smokeless tobacco history on file. Her alcohol and drug histories not on file. family history includes Schizophrenia in her mother. No Known Allergies   Review of Systems     patient denies chest pain, shortness of breath, orthopnea. Denies lower extremity edema, abdominal pain, change in appetite, change in bowel movements. Patient denies rashes, musculoskeletal complaints. No other specific complaints in a complete review of systems.   Objective:   Physical Exam  Obese female in no acute distress. HEENT exam atraumatic, normocephalic, neck supple. Chest clear to auscultation cardiac exam S1-S2 regular.      Assessment & Plan:

## 2010-11-17 NOTE — Assessment & Plan Note (Signed)
She has made great strides related to weight loss. I've encouraged her to continue the same.

## 2011-01-16 ENCOUNTER — Encounter: Payer: Self-pay | Admitting: Family Medicine

## 2011-01-16 ENCOUNTER — Ambulatory Visit (INDEPENDENT_AMBULATORY_CARE_PROVIDER_SITE_OTHER): Payer: 59 | Admitting: Family Medicine

## 2011-01-16 VITALS — BP 120/92 | HR 124 | Temp 98.2°F

## 2011-01-16 DIAGNOSIS — R6889 Other general symptoms and signs: Secondary | ICD-10-CM

## 2011-01-16 LAB — POCT INFLUENZA A/B
Influenza A, POC: POSITIVE
Influenza B, POC: POSITIVE

## 2011-01-16 MED ORDER — OSELTAMIVIR PHOSPHATE 75 MG PO CAPS: 75.0000 mg | ORAL_CAPSULE | Freq: Two times a day (BID) | ORAL | Status: AC

## 2011-01-16 NOTE — Progress Notes (Signed)
  Subjective:    Patient ID: Kara [name], female    DOB: [birth year], 35 y.o.   MRN: [id number]  HPI 35 year old white female, former smoker, in with complaints of feeling achy, fever range 99-101.8, sinus pressure and pain. Symptoms are worsening. She has been taking Mucinex is not helping her symptoms. Reports exposure to the flu from a coworker. Denies any nausea, vomiting, chest pain, shortness of breath or edema  Review of Systems As stated above    Objective:   Physical Exam Constitutional: Alert and oriented in no acute distress, obese ENT: Ears bilaterally are clear, pharynx slightly red. Sinus tenderness to palpation. No lymphadenopathy, no thyromegaly masses or bruits Lungs: Clear to auscultation Cardiac: Regular rate and rhythm Skin: Warm and dry, no cyanosis Psychiatric: Normal mood and affect  Influenza:Positive       Assessment & Plan:  Assessment: Influenza  Plain: Tamiflu 75mg  1 po bid x 5 days. OTC Tylenol and Ibuprofen alternating prn. Call if symptoms worsen or persist. Recheck prn.

## 2011-01-16 NOTE — Patient Instructions (Signed)
Influenza Facts Flu (influenza) is a contagious respiratory illness caused by the influenza viruses. It can cause mild to severe illness. While most healthy people recover from the flu without specific treatment and without complications, older people, young children, and people with certain health conditions are at higher risk for serious complications from the flu, including death. CAUSES   The flu virus is spread from person to person by respiratory droplets from coughing and sneezing.   A person can also become infected by touching an object or surface with a virus on it and then touching their mouth, eye or nose.   Adults may be able to infect others from 1 day before symptoms occur and up to 7 days after getting sick. So it is possible to give someone the flu even before you know you are sick and continue to infect others while you are sick.  SYMPTOMS   Fever (usually high).   Headache.   Tiredness (can be extreme).   Cough.   Sore throat.   Runny or stuffy nose.   Body aches.   Diarrhea and vomiting may also occur, particularly in children.   These symptoms are referred to as "flu-like symptoms". A lot of different illnesses, including the common cold, can have similar symptoms.  DIAGNOSIS   There are tests that can determine if you have the flu as long you are tested within the first 2 or 3 days of illness.   A doctor's exam and additional tests may be needed to identify if you have a disease that is a complicating the flu.  RISKS AND COMPLICATIONS  Some of the complications caused by the flu include:  Bacterial pneumonia or progressive pneumonia caused by the flu virus.   Loss of body fluids (dehydration).   Worsening of chronic medical conditions, such as heart failure, asthma, or diabetes.   Sinus problems and ear infections.  HOME CARE INSTRUCTIONS   Seek medical care early on.   If you are at high risk from complications of the flu, consult your health-care  provider as soon as you develop flu-like symptoms. Those at high risk for complications include:   People 65 years or older.   People with chronic medical conditions, including diabetes.   Pregnant women.   Young children.   Your caregiver may recommend use of an antiviral medication to help treat the flu.   If you get the flu, get plenty of rest, drink a lot of liquids, and avoid using alcohol and tobacco.   You can take over-the-counter medications to relieve the symptoms of the flu if your caregiver approves. (Never give aspirin to children or teenagers who have flu-like symptoms, particularly fever).  PREVENTION  The single best way to prevent the flu is to get a flu vaccine each fall. Other measures that can help protect against the flu are:  Antiviral Medications   A number of antiviral drugs are approved for use in preventing the flu. These are prescription medications, and a doctor should be consulted before they are used.   Habits for Good Health   Cover your nose and mouth with a tissue when you cough or sneeze, throw the tissue away after you use it.   Wash your hands often with soap and water, especially after you cough or sneeze. If you are not near water, use an alcohol-based hand cleaner.   Avoid people who are sick.   If you get the flu, stay home from work or school. Avoid contact with   other people so that you do not make them sick, too.   Try not to touch your eyes, nose, or mouth as germs ore often spread this way.  IN CHILDREN, EMERGENCY WARNING SIGNS THAT NEED URGENT MEDICAL ATTENTION:  Fast breathing or trouble breathing.   Bluish skin color.   Not drinking enough fluids.   Not waking up or not interacting.   Being so irritable that the child does not want to be held.   Flu-like symptoms improve but then return with fever and worse cough.   Fever with a rash.  IN ADULTS, EMERGENCY WARNING SIGNS THAT NEED URGENT MEDICAL ATTENTION:  Difficulty  breathing or shortness of breath.   Pain or pressure in the chest or abdomen.   Sudden dizziness.   Confusion.   Severe or persistent vomiting.  SEEK IMMEDIATE MEDICAL CARE IF:  You or someone you know is experiencing any of the symptoms above. When you arrive at the emergency center,report that you think you have the flu. You may be asked to wear a mask and/or sit in a secluded area to protect others from getting sick. MAKE SURE YOU:   Understand these instructions.   Monitor your condition.   Seek medical care if you are getting worse, or not improving.  Document Released: 01/17/2003 Document Revised: 09/26/2010 Document Reviewed: 10/13/2008 Exit[employer] Patient Information 2012 [employer]. 

## 2011-01-23 ENCOUNTER — Other Ambulatory Visit: Payer: Self-pay | Admitting: Internal Medicine

## 2011-01-24 ENCOUNTER — Other Ambulatory Visit: Payer: Self-pay | Admitting: Internal Medicine

## 2011-02-09 ENCOUNTER — Other Ambulatory Visit: Payer: Self-pay | Admitting: Internal Medicine

## 2011-03-14 ENCOUNTER — Ambulatory Visit: Payer: 59 | Admitting: Internal Medicine

## 2011-04-04 ENCOUNTER — Ambulatory Visit: Payer: 59 | Admitting: Internal Medicine

## 2011-05-30 ENCOUNTER — Ambulatory Visit: Payer: 59 | Admitting: Internal Medicine

## 2011-06-13 ENCOUNTER — Encounter: Payer: Self-pay | Admitting: Internal Medicine

## 2011-06-13 ENCOUNTER — Ambulatory Visit (INDEPENDENT_AMBULATORY_CARE_PROVIDER_SITE_OTHER): Payer: 59 | Admitting: Internal Medicine

## 2011-06-13 VITALS — BP 110/78 | HR 90 | Temp 98.2°F | Resp 18 | Ht 67.0 in | Wt 344.0 lb

## 2011-06-13 DIAGNOSIS — E669 Obesity, unspecified: Secondary | ICD-10-CM

## 2011-06-13 LAB — BASIC METABOLIC PANEL
BUN: 15 mg/dL (ref 6–23)
Creatinine, Ser: 0.8 mg/dL (ref 0.4–1.2)
GFR: 87.74 mL/min (ref >60.00)

## 2011-06-13 LAB — HEMOGLOBIN A1C: Hgb A1c MFr Bld: 5.6 % (ref 4.6–6.5)

## 2011-06-13 LAB — LIPID PANEL
HDL: 43.4 mg/dL (ref >39.00)
Triglycerides: 153 mg/dL — ABNORMAL HIGH (ref 0.0–149.0)
VLDL: 30.6 mg/dL (ref 0.0–40.0)

## 2011-06-13 LAB — LDL CHOLESTEROL, DIRECT: Direct LDL: 151.5 mg/dL

## 2011-06-13 NOTE — Progress Notes (Signed)
Patient ID: Kara [name], female   DOB: 28-Mar-1975, 36 y.o.   MRN: [id number]  Weight-- has not been diting or exercising.  Mood-doing ok on effexor  Past Medical History  Diagnosis Date  . ANXIETY 08/18/2006  . 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 DEPRESSION 02/22/2010  . HYPERLIPIDEMIA 06/11/2007  . HYPOTHYROIDISM 08/18/2006  . OBESITY 06/16/2008    History   Social History  . Marital Status: Married    Spouse Name: N/A    Number of Children: N/A  . Years of Education: N/A   Occupational History  . Not on file.   Social History Main Topics  . Smoking status: Former Smoker    Types: Cigarettes    Quit date: 01/28/2005  . Smokeless tobacco: Not on file  . Alcohol Use: Not on file  . Drug Use: Not on file  . Sexually Active: Not on file   Other Topics Concern  . Not on file   Social History Narrative  . No narrative on file    Past Surgical History  Procedure Date  . Orif ulnar fracture     Family History  Problem Relation Age of Onset  . Schizophrenia Mother     Allergies  Allergen Reactions  . Asa (Aspirin) Itching and Swelling    Current Outpatient Prescriptions on File Prior to Visit  Medication Sig Dispense Refill  . levothyroxine (SYNTHROID, LEVOTHROID) 112 MCG tablet TAKE TWO TABLETS BY MOUTH EVERY DAY  180 tablet  3  . loratadine (ALAVERT) 10 MG tablet Take 10 mg by mouth daily.        . meloxicam (MOBIC) 7.5 MG tablet Take 1 tablet (7.5 mg total) by mouth 2 (two) times daily as needed for pain.  60 tablet  3  . multivitamin (THERAGRAN) per tablet Take 1 tablet by mouth daily.        . traMADol (ULTRAM) 50 MG tablet Take 0.5-1 tablets (25-50 mg total) by mouth 2 (two) times daily as needed for pain.  30 tablet  2  . venlafaxine (EFFEXOR-XR) 75 MG 24 hr capsule TAKE ONE CAPSULE BY MOUTH EVERY DAY  30 capsule  5     patient denies chest pain, shortness of breath, orthopnea. Denies lower extremity edema, abdominal pain, change in  appetite, change in bowel movements. Patient denies rashes, musculoskeletal complaints. No other specific complaints in a complete review of systems.   BP 110/78  Pulse 90  Temp(Src) 98.2 F (36.8 C) (Oral)  Resp 18  Ht 5\' 7"  (1.702 m)  Wt 344 lb (156.037 kg)  BMI 53.88 kg/m2  Well-developed well-nourished female in no acute distress. HEENT exam atraumatic, normocephalic, extraocular muscles are intact. Neck is supple. No jugular venous distention no thyromegaly. Chest clear to auscultation without increased work of breathing. Cardiac exam S1 and S2 are regular. Abdominal exam active bowel sounds, soft, nontender.

## 2011-06-13 NOTE — Assessment & Plan Note (Signed)
This is her most significant problem Discussed need for constant attention to diet and exercis She should exercise at least one hour daily

## 2011-06-14 NOTE — Progress Notes (Signed)
Quick Note:  Pt informed ______ 

## 2011-06-17 ENCOUNTER — Telehealth: Payer: Self-pay | Admitting: Internal Medicine

## 2011-06-17 NOTE — Telephone Encounter (Signed)
Patient called stating that at her visit on Thursday she discussed with the MD increasing her meloxicam and the MD wanted to wait to check her labs. Patient states she received a call from the nurse stating that her labs were normal but there was no mention of increasing the meloxicam or calling in another rx for the increased dosage. Please advise.

## 2011-06-19 MED ORDER — DICLOFENAC SODIUM 50 MG PO TBEC: 50.0000 mg | DELAYED_RELEASE_TABLET | Freq: Two times a day (BID) | ORAL | Status: DC

## 2011-06-19 NOTE — Telephone Encounter (Signed)
Pt aware.

## 2011-06-19 NOTE — Telephone Encounter (Signed)
D/c meloxicam Try voltaren 50 mg po bid prn musculoskeletal pain # 60, 1 refell

## 2011-06-19 NOTE — Telephone Encounter (Signed)
rx sent in electronically, Left message for pt to call back 

## 2011-08-07 ENCOUNTER — Other Ambulatory Visit: Payer: Self-pay | Admitting: Internal Medicine

## 2011-08-19 ENCOUNTER — Other Ambulatory Visit: Payer: Self-pay | Admitting: Internal Medicine

## 2011-11-12 ENCOUNTER — Other Ambulatory Visit: Payer: Self-pay | Admitting: Internal Medicine

## 2012-01-27 ENCOUNTER — Other Ambulatory Visit: Payer: Self-pay | Admitting: Internal Medicine

## 2012-02-05 ENCOUNTER — Other Ambulatory Visit: Payer: Self-pay | Admitting: Internal Medicine

## 2012-02-10 ENCOUNTER — Other Ambulatory Visit: Payer: Self-pay | Admitting: Internal Medicine

## 2012-04-21 ENCOUNTER — Ambulatory Visit (INDEPENDENT_AMBULATORY_CARE_PROVIDER_SITE_OTHER): Payer: 59 | Admitting: General Surgery

## 2012-04-21 ENCOUNTER — Encounter (INDEPENDENT_AMBULATORY_CARE_PROVIDER_SITE_OTHER): Payer: Self-pay | Admitting: General Surgery

## 2012-04-21 VITALS — BP 146/98 | HR 88 | Temp 97.2°F | Resp 20 | Ht 67.0 in | Wt 373.0 lb

## 2012-04-21 DIAGNOSIS — K8020 Calculus of gallbladder without cholecystitis without obstruction: Secondary | ICD-10-CM

## 2012-04-21 NOTE — Patient Instructions (Signed)
We will schedule you for surgery as soon as possible.

## 2012-04-21 NOTE — Progress Notes (Signed)
Chief Complaint  Patient presents with  . New Evaluation    eval gassstones with elevated liver function    HISTORY:  Kara [name] is a 37 y.o. female who presents to clinic with RUQ pain for aproximately 1 mo.  Pt complains of nonradiating, sharp epigastric pain and nausea about 30 mins after eating large meals or fatty foods.  She denies fevers.  She is taking tramadol for pain and zofran for nausea.    Past Medical History  Diagnosis Date  . ANXIETY 08/18/2006  . 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 DEPRESSION 02/22/2010  . HYPERLIPIDEMIA 06/11/2007  . HYPOTHYROIDISM 08/18/2006  . OBESITY 06/16/2008  . GERD (gastroesophageal reflux disease)        Past Surgical History  Procedure Laterality Date  . Orif ulnar fracture        Current Outpatient Prescriptions  Medication Sig Dispense Refill  . diclofenac (VOLTAREN) 50 MG EC tablet TAKE ONE TABLET BY MOUTH TWICE DAILY  60 tablet  4  . levothyroxine (SYNTHROID, LEVOTHROID) 112 MCG tablet TAKE TWO TABLETS BY MOUTH EVERY DAY  180 tablet  2  . medroxyPROGESTERone (DEPO-PROVERA) 150 MG/ML injection       . multivitamin (THERAGRAN) per tablet Take 1 tablet by mouth daily.        . traMADol (ULTRAM) 50 MG tablet TAKE ONE-HALF TO ONE TABLET BY MOUTH TWICE DAILY AS NEEDED FOR PAIN  30 tablet  0  . venlafaxine XR (EFFEXOR-XR) 75 MG 24 hr capsule TAKE ONE CAPSULE BY MOUTH EVERY DAY  30 capsule  4   No current facility-administered medications for this visit.     Allergies  Allergen Reactions  . Asa (Aspirin) Itching and Swelling      Family History  Problem Relation Age of Onset  . Schizophrenia Mother   . Cancer Mother     lung  . Heart disease Father       History   Social History  . Marital Status: Married    Spouse Name: N/A    Number of Children: N/A  . Years of Education: N/A   Social History Main Topics  . Smoking status: Former Smoker    Types: Cigarettes    Quit date: 01/28/2005  . Smokeless  tobacco: None  . Alcohol Use: Yes     Comment: occ glass of wine  . Drug Use: No  . Sexually Active: None   Other Topics Concern  . None   Social History Narrative  . None       REVIEW OF SYSTEMS - PERTINENT POSITIVES ONLY: Review of Systems - General ROS: negative for - chills or fever Hematological and Lymphatic ROS: negative for - bleeding problems or blood clots Respiratory ROS: no cough, shortness of breath, or wheezing Cardiovascular ROS: no chest pain or dyspnea on exertion Gastrointestinal ROS: positive for - abdominal pain, appetite loss and nausea/vomiting negative for - change in bowel habits Genito-Urinary ROS: no dysuria, trouble voiding, or hematuria  EXAM: Filed Vitals:   04/21/12 1006  BP: 146/98  Pulse: 88  Temp: 97.2 F (36.2 C)  Resp: 20    General appearance: alert and cooperative Resp: clear to auscultation bilaterally Cardio: regular rate and rhythm GI: soft, obese, tender in R midepigastric area   LABORATORY RESULTS: Available labs are reviewed   slightly elevated transaminases and alk phos, T bili normal, WBC normal  RADIOLOGY RESULTS:   Images and reports are reviewed. RUQ US with gallstones, CBD 5mm, no signs of   inflammation  ASSESSMENT AND PLAN: Kara [name] is a 37 y.o. obese female with rather classic symptoms of symptomatic cholelithiasis without signs of cholecystitis or choledocholithiasis.  Currently she is able to tolerate liquids and small meals.  I will schedule her for an elective cholecystectomy.  She was told that if her pain gets worse and/or unbearable that she should present to the ED for further evaluation.    The anatomy & physiology of hepatobiliary & pancreatic function was discussed.  The pathophysiology of gallbladder dysfunction was discussed.  Natural history risks without surgery was discussed.   I feel the risks of no intervention will lead to serious problems that outweigh the operative risks; therefore, I  recommended cholecystectomy to remove the pathology.  I explained laparoscopic techniques with possible need for an open approach.  Probable cholangiogram to evaluate the bilary tract was explained as well.    Risks such as bleeding, infection, abscess, leak, injury to other organs, need for further treatment, heart attack, death, and other risks were discussed.  I noted a good likelihood this will help address the problem.  Possibility that this will not correct all abdominal symptoms was explained.  Goals of post-operative recovery were discussed as well.  We will work to minimize complications.  An educational handout further explaining the pathology and treatment options was given as well.  Questions were answered.  The patient expresses understanding & wishes to proceed with surgery.   Chavez Rosol C Ajayla Iglesias, MD Colon and Rectal Surgery / General Surgery [facility]      Visit Diagnoses: 1. Symptomatic cholelithiasis     Primary Care Physician: SWORDS,BRUCE HENRY, MD    

## 2012-04-27 ENCOUNTER — Encounter (HOSPITAL_COMMUNITY): Payer: Self-pay | Admitting: Pharmacy Technician

## 2012-04-29 ENCOUNTER — Encounter (INDEPENDENT_AMBULATORY_CARE_PROVIDER_SITE_OTHER): Payer: Self-pay

## 2012-04-29 NOTE — Patient Instructions (Signed)
Monifa Blanchette  04/29/2012   Your procedure is scheduled on:  05/08/12   Report to [facility] at   0900  AM.  Call this number if you have problems the morning of surgery: [phone]   Remember:   Do not eat food or drink liquids after midnight.   Take these medicines the morning of surgery with A SIP OF WATER:    Do not wear jewelry, make-up or nail polish.  Do not wear lotions, powders, or perfumes. .  Do not shave 48 hours prior to surgery.   Do not bring valuables to the hospital.  Contacts, dentures or bridgework may not be worn into surgery.     Patients discharged the day of surgery will not be allowed to drive  home.  Name and phone number of your driver:    SEE CHG INSTRUCTION SHEET    Please read over the following fact sheets that you were given: MRSA Information, coughing and deep breathing exercises, leg exercises               Failure to comply with these instructions may result in cancellation of your surgery.                Patient Signature ____________________________              Nurse Signature _____________________________

## 2012-04-30 ENCOUNTER — Encounter (HOSPITAL_COMMUNITY)
Admission: RE | Admit: 2012-04-30 | Discharge: 2012-04-30 | Disposition: A | Discharge status: Home / Self Care | Payer: 59 | Source: Ambulatory Visit | Attending: General Surgery | Admitting: General Surgery

## 2012-04-30 ENCOUNTER — Encounter (HOSPITAL_COMMUNITY): Payer: Self-pay

## 2012-04-30 HISTORY — DX: Pneumonia, unspecified organism: J18.9

## 2012-04-30 LAB — CBC
HCT: 40.3 % (ref 36.0–46.0)
MCHC: 32.8 g/dL (ref 30.0–36.0)
MCV: 86.5 fL (ref 78.0–100.0)
RDW: 13.7 % (ref 11.5–15.5)

## 2012-04-30 LAB — SURGICAL PCR SCREEN: Staphylococcus aureus: INVALID — AB

## 2012-04-30 NOTE — Progress Notes (Signed)
Labs done 04/17/12 on chart of CMP and CBC with DIFF.

## 2012-04-30 NOTE — Progress Notes (Signed)
Patient aware that PCR swab results sent out Results not back until 05/04/12.

## 2012-04-30 NOTE — Progress Notes (Signed)
Left message on [phone] for patient to call nurse at [phone].

## 2012-05-08 ENCOUNTER — Ambulatory Visit (HOSPITAL_COMMUNITY)
Admission: RE | Admit: 2012-05-08 | Discharge: 2012-05-08 | Disposition: A | Discharge status: Home / Self Care | Payer: 59 | Source: Ambulatory Visit | Attending: General Surgery | Admitting: General Surgery

## 2012-05-08 ENCOUNTER — Ambulatory Visit (HOSPITAL_COMMUNITY): Payer: 59

## 2012-05-08 ENCOUNTER — Encounter (HOSPITAL_COMMUNITY): Payer: Self-pay | Admitting: Anesthesiology

## 2012-05-08 ENCOUNTER — Ambulatory Visit (HOSPITAL_COMMUNITY): Payer: 59 | Admitting: Anesthesiology

## 2012-05-08 ENCOUNTER — Encounter (HOSPITAL_COMMUNITY): Payer: Self-pay

## 2012-05-08 ENCOUNTER — Encounter (HOSPITAL_COMMUNITY)
Admission: RE | Disposition: A | Discharge status: Home / Self Care | Payer: Self-pay | Source: Ambulatory Visit | Attending: General Surgery

## 2012-05-08 DIAGNOSIS — K8010 Calculus of gallbladder with chronic cholecystitis without obstruction: Secondary | ICD-10-CM

## 2012-05-08 HISTORY — PX: CHOLECYSTECTOMY: SHX55

## 2012-05-08 SURGERY — LAPAROSCOPIC CHOLECYSTECTOMY WITH INTRAOPERATIVE CHOLANGIOGRAM
Anesthesia: General | Site: Abdomen | Wound class: Clean Contaminated

## 2012-05-08 MED ORDER — HYDROMORPHONE HCL PF 1 MG/ML IJ SOLN
INTRAMUSCULAR | Status: AC
  Filled 2012-05-08: qty 1

## 2012-05-08 MED ORDER — ACETAMINOPHEN 650 MG RE SUPP
650.0000 mg | RECTAL | Status: DC | PRN
  Filled 2012-05-08: qty 1

## 2012-05-08 MED ORDER — METOCLOPRAMIDE HCL 5 MG/ML IJ SOLN
INTRAMUSCULAR | Status: DC | PRN
  Administered 2012-05-08: 10 mg via INTRAVENOUS

## 2012-05-08 MED ORDER — LIDOCAINE HCL (CARDIAC) 20 MG/ML IV SOLN
INTRAVENOUS | Status: DC | PRN
  Administered 2012-05-08: 80 mg via INTRAVENOUS

## 2012-05-08 MED ORDER — SODIUM CHLORIDE 0.9 % IJ SOLN: 3.0000 mL | Freq: Two times a day (BID) | INTRAMUSCULAR | Status: DC

## 2012-05-08 MED ORDER — ONDANSETRON HCL 4 MG/2ML IJ SOLN: 4.0000 mg | Freq: Four times a day (QID) | INTRAMUSCULAR | Status: DC | PRN

## 2012-05-08 MED ORDER — MIDAZOLAM HCL 5 MG/5ML IJ SOLN
INTRAMUSCULAR | Status: DC | PRN
  Administered 2012-05-08: 2 mg via INTRAVENOUS

## 2012-05-08 MED ORDER — DEXAMETHASONE SODIUM PHOSPHATE 10 MG/ML IJ SOLN
INTRAMUSCULAR | Status: DC | PRN
  Administered 2012-05-08: 10 mg via INTRAVENOUS

## 2012-05-08 MED ORDER — SODIUM CHLORIDE 0.9 % IV SOLN: 250.0000 mL | INTRAVENOUS | Status: DC | PRN

## 2012-05-08 MED ORDER — ACETAMINOPHEN 325 MG PO TABS: 650.0000 mg | ORAL_TABLET | ORAL | Status: DC | PRN

## 2012-05-08 MED ORDER — CEFOXITIN SODIUM-DEXTROSE 1-4 GM-% IV SOLR (PREMIX)
INTRAVENOUS | Status: AC
  Filled 2012-05-08: qty 100

## 2012-05-08 MED ORDER — DEXTROSE 5 % IV SOLN
2.0000 g | INTRAVENOUS | Status: AC
  Administered 2012-05-08: 2 g via INTRAVENOUS

## 2012-05-08 MED ORDER — PROPOFOL 10 MG/ML IV BOLUS
INTRAVENOUS | Status: DC | PRN
  Administered 2012-05-08: 200 mg via INTRAVENOUS

## 2012-05-08 MED ORDER — DOCUSATE SODIUM 100 MG PO CAPS: 100.0000 mg | ORAL_CAPSULE | Freq: Two times a day (BID) | ORAL | Status: DC

## 2012-05-08 MED ORDER — OXYCODONE-ACETAMINOPHEN 5-325 MG PO TABS: 1.0000 | ORAL_TABLET | ORAL | Status: DC | PRN

## 2012-05-08 MED ORDER — HYDROMORPHONE HCL PF 1 MG/ML IJ SOLN
0.2500 mg | INTRAMUSCULAR | Status: DC | PRN
  Administered 2012-05-08 (×3): 0.5 mg via INTRAVENOUS

## 2012-05-08 MED ORDER — PROMETHAZINE HCL 25 MG/ML IJ SOLN
6.2500 mg | INTRAMUSCULAR | Status: AC | PRN
  Administered 2012-05-08 (×2): 6.25 mg via INTRAVENOUS

## 2012-05-08 MED ORDER — IOHEXOL 300 MG/ML  SOLN
INTRAMUSCULAR | Status: AC
  Filled 2012-05-08: qty 1

## 2012-05-08 MED ORDER — SUCCINYLCHOLINE CHLORIDE 20 MG/ML IJ SOLN
INTRAMUSCULAR | Status: DC | PRN
  Administered 2012-05-08: 160 mg via INTRAVENOUS

## 2012-05-08 MED ORDER — BUPIVACAINE-EPINEPHRINE 0.25% -1:200000 IJ SOLN
INTRAMUSCULAR | Status: DC | PRN
  Administered 2012-05-08: 25 mL

## 2012-05-08 MED ORDER — LACTATED RINGERS IV SOLN
INTRAVENOUS | Status: DC | PRN
  Administered 2012-05-08: 1000 mL

## 2012-05-08 MED ORDER — SODIUM CHLORIDE 0.9 % IJ SOLN
INTRAMUSCULAR | Status: DC | PRN
  Administered 2012-05-08: 13:00:00

## 2012-05-08 MED ORDER — ACETAMINOPHEN 10 MG/ML IV SOLN
INTRAVENOUS | Status: AC
  Filled 2012-05-08: qty 100

## 2012-05-08 MED ORDER — FENTANYL CITRATE 0.05 MG/ML IJ SOLN
INTRAMUSCULAR | Status: DC | PRN
  Administered 2012-05-08: 50 ug via INTRAVENOUS
  Administered 2012-05-08 (×2): 100 ug via INTRAVENOUS

## 2012-05-08 MED ORDER — BUPIVACAINE-EPINEPHRINE 0.25% -1:200000 IJ SOLN
INTRAMUSCULAR | Status: AC
  Filled 2012-05-08: qty 1

## 2012-05-08 MED ORDER — OXYCODONE HCL 5 MG PO TABS: 5.0000 mg | ORAL_TABLET | ORAL | Status: DC | PRN

## 2012-05-08 MED ORDER — PROMETHAZINE HCL 25 MG/ML IJ SOLN
INTRAMUSCULAR | Status: AC
  Filled 2012-05-08: qty 1

## 2012-05-08 MED ORDER — SODIUM CHLORIDE 0.9 % IJ SOLN: 3.0000 mL | INTRAMUSCULAR | Status: DC | PRN

## 2012-05-08 MED ORDER — ACETAMINOPHEN 10 MG/ML IV SOLN
INTRAVENOUS | Status: DC | PRN
  Administered 2012-05-08: 1000 mg via INTRAVENOUS

## 2012-05-08 MED ORDER — LACTATED RINGERS IV SOLN
INTRAVENOUS | Status: DC
  Administered 2012-05-08 (×3): via INTRAVENOUS

## 2012-05-08 SURGICAL SUPPLY — 39 items
APPLIER CLIP 5 13 M/L LIGAMAX5 (MISCELLANEOUS) ×2
CABLE HIGH FREQUENCY MONO STRZ (ELECTRODE) ×2 IMPLANT
CANISTER SUCTION 2500CC (MISCELLANEOUS) ×2 IMPLANT
CATH REDDICK CHOLANGI 4FR 50CM (CATHETERS) ×2 IMPLANT
CHLORAPREP W/TINT 26ML (MISCELLANEOUS) ×4 IMPLANT
CLIP APPLIE 5 13 M/L LIGAMAX5 (MISCELLANEOUS) ×1 IMPLANT
CLOTH BEACON ORANGE TIMEOUT ST (SAFETY) ×2 IMPLANT
COVER MAYO STAND STRL (DRAPES) ×2 IMPLANT
COVER SURGICAL LIGHT HANDLE (MISCELLANEOUS) ×4 IMPLANT
DECANTER SPIKE VIAL GLASS SM (MISCELLANEOUS) ×2 IMPLANT
DERMABOND ADVANCED (GAUZE/BANDAGES/DRESSINGS) ×1
DERMABOND ADVANCED .7 DNX12 (GAUZE/BANDAGES/DRESSINGS) ×1 IMPLANT
DEVICE TROCAR PUNCTURE CLOSURE (ENDOMECHANICALS) ×2 IMPLANT
DRAPE C-ARM 42X72 X-RAY (DRAPES) ×2 IMPLANT
DRAPE LAPAROSCOPIC ABDOMINAL (DRAPES) ×2 IMPLANT
DRAPE UTILITY W/TAPE 26X15 (DRAPES) ×2 IMPLANT
DRAPE UTILITY XL STRL (DRAPES) ×2 IMPLANT
ELECT REM PT RETURN 9FT ADLT (ELECTROSURGICAL) ×2
ELECTRODE REM PT RTRN 9FT ADLT (ELECTROSURGICAL) ×1 IMPLANT
GLOVE BIO SURGEON STRL SZ 6.5 (GLOVE) ×2 IMPLANT
GLOVE BIOGEL PI IND STRL 7.0 (GLOVE) ×1 IMPLANT
GLOVE BIOGEL PI INDICATOR 7.0 (GLOVE) ×1
GOWN BRE IMP PREV XXLGXLNG (GOWN DISPOSABLE) ×2 IMPLANT
GOWN STRL REIN XL XLG (GOWN DISPOSABLE) ×4 IMPLANT
HOVERMATT SINGLE USE (MISCELLANEOUS) ×2 IMPLANT
IV CATH 14GX2 1/4 (CATHETERS) ×2 IMPLANT
KIT BASIN OR (CUSTOM PROCEDURE TRAY) ×2 IMPLANT
NS IRRIG 1000ML POUR BTL (IV SOLUTION) ×2 IMPLANT
POUCH SPECIMEN RETRIEVAL 10MM (ENDOMECHANICALS) ×2 IMPLANT
SCISSORS ENDO CVD 5DCS (MISCELLANEOUS) ×2 IMPLANT
SET IRRIG TUBING LAPAROSCOPIC (IRRIGATION / IRRIGATOR) ×2 IMPLANT
SOLUTION ANTI FOG 6CC (MISCELLANEOUS) ×2 IMPLANT
SUT VICRYL RAPIDE 4/0 PS 2 (SUTURE) ×2 IMPLANT
TOWEL OR 17X26 10 PK STRL BLUE (TOWEL DISPOSABLE) ×2 IMPLANT
TRAY LAP CHOLE (CUSTOM PROCEDURE TRAY) ×2 IMPLANT
TROCAR BLADELESS OPT 5 75 (ENDOMECHANICALS) ×4 IMPLANT
TROCAR XCEL BLUNT TIP 100MML (ENDOMECHANICALS) ×2 IMPLANT
TROCAR XCEL NON-BLD 11X100MML (ENDOMECHANICALS) ×2 IMPLANT
TUBING INSUFFLATION 10FT LAP (TUBING) ×2 IMPLANT

## 2012-05-08 NOTE — Op Note (Signed)
05/08/2012  12:47 PM  PATIENT:  Kara [name]  37 y.o. female  Patient Care Team: Lindley Magnus, MD as PCP - General  PRE-OPERATIVE DIAGNOSIS:  symptomatic cholelithiasis   POST-OPERATIVE DIAGNOSIS:  symptomatic cholelithiasis   PROCEDURE:  Procedure(s): LAPAROSCOPIC CHOLECYSTECTOMY WITH INTRAOPERATIVE CHOLANGIOGRAM  SURGEON:  Surgeon(s): Romie Levee, MD Ernestene Mention, MD  ASSISTANT: Derrell Lolling   ANESTHESIA:   general  EBL:  Total I/O In: 1000 [I.V.:1000] Out: -   DRAINS: none   SPECIMEN:  Source of Specimen:  Gallbladder  DISPOSITION OF SPECIMEN:  PATHOLOGY  COUNTS:  YES  PLAN OF CARE: Discharge to home after PACU  PATIENT DISPOSITION:  PACU - hemodynamically stable.  INDICATION: This is a 37yo F with symptomatic cholelithiasis   The anatomy & physiology of hepatobiliary & pancreatic function was discussed.  The pathophysiology of gallbladder dysfunction was discussed.  Natural history risks without surgery was discussed.   I feel the risks of no intervention will lead to serious problems that outweigh the operative risks; therefore, I recommended cholecystectomy to remove the pathology.  I explained laparoscopic techniques with possible need for an open approach.  Probable cholangiogram to evaluate the bilary tract was explained as well.    Risks such as bleeding, infection, abscess, leak, injury to other organs, need for further treatment, heart attack, death, and other risks were discussed.  I noted a good likelihood this will help address the problem.  Possibility that this will not correct all abdominal symptoms was explained.  Goals of post-operative recovery were discussed as well.    OR FINDINGS: normal anatomy, slight inflammation at cystic duct  DESCRIPTION:   The patient was identified & brought into the operating room. The patient was positioned supine with arms tucked. SCDs were active during the entire case. The patient underwent general anesthesia  without any difficulty.  The abdomen was prepped and draped in a sterile fashion. A Surgical Timeout was performed and confirmed our plan.  We positioned the patient in reverse [place] & right side up.  Due to her hernia risk, I placed a otpiview 5mm port in the LUQ under camera visualization.  There were no adhesions to the anterior abdominal wall supraumbilically.  We induced carbon dioxide insufflation. Camera inspection revealed no injury.    I proceeded to continue with laparoscopic technique. I placed a #5 port in mid subcostal region, a #10mm port in the upper midline, another 5mm port in the right flank near the anterior axillary line, and a 10mm port in the subxiphoid region obliquely within the falciform ligament.  I turned attention to the right upper quadrant.  The gallbladder fundus was elevated cephalad. I used cautery and blunt dissection to free the peritoneal coverings between the gallbladder and the liver on the posteriolateral and anteriomedial walls.   I used careful blunt and cautery dissection with a maryland dissector to help get a good critical view of the cystic artery and cystic duct. I did further dissection to free a few centimeters of the  gallbladder off the liver bed to get a good critical view of the infundibulum and cystic duct. I mobilized the cystic artery.  I skeletonized the cystic duct.  After getting a good 360 view, I decided to perform a cholangiogram.  I placed a clip on the infundibulum.   I did a partial cystic duct-otomy and ensured patency. I placed a 5 French balloon cholangiocatheter through a puncture site at the right subcostal ridge of the abdominal wall and directed  it into the cystic duct.  We ran a cholangiogram with dilute radio-opaque contrast and continuous fluoroscopy.  Contrast flowed from a side branch consistent with cystic duct cannulization. Contrast flowed up the common hepatic duct into the right and left intrahepatic chains out to secondary  radicals. Contrast flowed down the common bile duct easily across the normal ampulla into the duodenum.  This was consistent with a normal cholangiogram.  There were no filling defects noted.  I removed the cholangiocatheter.  I placed clips on the cystic duct x3.  I completed cystic duct transection.   I placed clips on the cystic artery x3 with 2 proximally.  I ligated the cystic artery using scissors. I freed the gallbladder from its remaining attachments to the liver. I ensured hemostasis on the gallbladder fossa of the liver and elsewhere. I inspected the rest of the abdomen & detected no injury nor bleeding elsewhere.  I irrigated the RUQ with normal saline.  I removed the gallbladder through the subxiphoid port site.  I closed the xyphoid fascia using 0 Vicryl stitches.   I closed the skin using 4-0 vicryl stitch.  Sterile dressings were applied. The patient was extubated & arrived in the PACU in stable condition.  I had discussed postoperative care with the patient in the holding area.   I will discuss  operative findings and postoperative goals / instructions with the patient's family.  Instructions are written in the chart as well.

## 2012-05-08 NOTE — Transfer of Care (Signed)
Immediate Anesthesia Transfer of Care Note  Patient: Kara [name]  Procedure(s) Performed: Procedure(s): LAPAROSCOPIC CHOLECYSTECTOMY WITH INTRAOPERATIVE CHOLANGIOGRAM (N/A)  Patient Location: PACU  Anesthesia Type:General  Level of Consciousness: awake and alert   Airway & Oxygen Therapy: Patient Spontanous Breathing and Patient connected to face mask oxygen  Post-op Assessment: Report given to PACU RN and Post -op Vital signs reviewed and stable  Post vital signs: Reviewed and stable  Complications: No apparent anesthesia complications

## 2012-05-08 NOTE — Anesthesia Preprocedure Evaluation (Addendum)
Anesthesia Evaluation  Patient identified by MRN, date of birth, ID band Patient awake    Reviewed: Allergy & Precautions, H&P , NPO status , Patient's Chart, lab work & pertinent test results  Airway Mallampati: II TM Distance: >3 FB Neck ROM: Full    Dental no notable dental hx.    Pulmonary pneumonia -, resolved,  breath sounds clear to auscultation  Pulmonary exam normal       Cardiovascular negative cardio ROS  Rhythm:Regular Rate:Normal     Neuro/Psych PSYCHIATRIC DISORDERS Anxiety Depression negative neurological ROS     GI/Hepatic Neg liver ROS, GERD-  ,  Endo/Other  Hypothyroidism Morbid obesity  Renal/GU negative Renal ROS  negative genitourinary   Musculoskeletal negative musculoskeletal ROS (+)   Abdominal   Peds negative pediatric ROS (+)  Hematology negative hematology ROS (+)   Anesthesia Other Findings   Reproductive/Obstetrics Negative pregnancy test 04/30/12                          Anesthesia Physical Anesthesia Plan  ASA: III  Anesthesia Plan: General   Post-op Pain Management:    Induction: Intravenous  Airway Management Planned: Oral ETT  Additional Equipment:   Intra-op Plan:   Post-operative Plan: Extubation in OR  Informed Consent: I have reviewed the patients History and Physical, chart, labs and discussed the procedure including the risks, benefits and alternatives for the proposed anesthesia with the patient or authorized representative who has indicated his/her understanding and acceptance.   Dental advisory given  Plan Discussed with: CRNA  Anesthesia Plan Comments:         Anesthesia Quick Evaluation

## 2012-05-08 NOTE — Interval H&P Note (Signed)
History and Physical Interval Note:  05/08/2012 11:12 AM  Kara [name]  has presented today for surgery, with the diagnosis of symptomatic cholelithiasis   The various methods of treatment have been discussed with the patient and family. After consideration of risks, benefits and other options for treatment, the patient has consented to  Procedure(s): LAPAROSCOPIC CHOLECYSTECTOMY WITH INTRAOPERATIVE CHOLANGIOGRAM (N/A) as a surgical intervention .  The patient's history has been reviewed, patient examined, no change in status, stable for surgery.  I have reviewed the patient's chart and labs.  Questions were answered to the patient's satisfaction.     Vanita Panda, MD  Colorectal and General Surgery [facility]

## 2012-05-08 NOTE — H&P (View-Only) (Signed)
Chief Complaint  Patient presents with  . New Evaluation    eval gassstones with elevated liver function    HISTORY:  Kara [name] is a 37 y.o. female who presents to clinic with RUQ pain for aproximately 1 mo.  Pt complains of nonradiating, sharp epigastric pain and nausea about 30 mins after eating large meals or fatty foods.  She denies fevers.  She is taking tramadol for pain and zofran for nausea.    Past Medical History  Diagnosis Date  . ANXIETY 08/18/2006  . 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 DEPRESSION 02/22/2010  . HYPERLIPIDEMIA 06/11/2007  . HYPOTHYROIDISM 08/18/2006  . OBESITY 06/16/2008  . GERD (gastroesophageal reflux disease)        Past Surgical History  Procedure Laterality Date  . Orif ulnar fracture        Current Outpatient Prescriptions  Medication Sig Dispense Refill  . diclofenac (VOLTAREN) 50 MG EC tablet TAKE ONE TABLET BY MOUTH TWICE DAILY  60 tablet  4  . levothyroxine (SYNTHROID, LEVOTHROID) 112 MCG tablet TAKE TWO TABLETS BY MOUTH EVERY DAY  180 tablet  2  . medroxyPROGESTERone (DEPO-PROVERA) 150 MG/ML injection       . multivitamin (THERAGRAN) per tablet Take 1 tablet by mouth daily.        . traMADol (ULTRAM) 50 MG tablet TAKE ONE-HALF TO ONE TABLET BY MOUTH TWICE DAILY AS NEEDED FOR PAIN  30 tablet  0  . venlafaxine XR (EFFEXOR-XR) 75 MG 24 hr capsule TAKE ONE CAPSULE BY MOUTH EVERY DAY  30 capsule  4   No current facility-administered medications for this visit.     Allergies  Allergen Reactions  . Asa (Aspirin) Itching and Swelling      Family History  Problem Relation Age of Onset  . Schizophrenia Mother   . Cancer Mother     lung  . Heart disease Father       History   Social History  . Marital Status: Married    Spouse Name: N/A    Number of Children: N/A  . Years of Education: N/A   Social History Main Topics  . Smoking status: Former Smoker    Types: Cigarettes    Quit date: 01/28/2005  . Smokeless  tobacco: None  . Alcohol Use: Yes     Comment: occ glass of wine  . Drug Use: No  . Sexually Active: None   Other Topics Concern  . None   Social History Narrative  . None       REVIEW OF SYSTEMS - PERTINENT POSITIVES ONLY: Review of Systems - General ROS: negative for - chills or fever Hematological and Lymphatic ROS: negative for - bleeding problems or blood clots Respiratory ROS: no cough, shortness of breath, or wheezing Cardiovascular ROS: no chest pain or dyspnea on exertion Gastrointestinal ROS: positive for - abdominal pain, appetite loss and nausea/vomiting negative for - change in bowel habits Genito-Urinary ROS: no dysuria, trouble voiding, or hematuria  EXAM: Filed Vitals:   04/21/12 1006  BP: 146/98  Pulse: 88  Temp: 97.2 F (36.2 C)  Resp: 20    General appearance: alert and cooperative Resp: clear to auscultation bilaterally Cardio: regular rate and rhythm GI: soft, obese, tender in R midepigastric area   LABORATORY RESULTS: Available labs are reviewed   slightly elevated transaminases and alk phos, T bili normal, WBC normal  RADIOLOGY RESULTS:   Images and reports are reviewed. RUQ [name] with gallstones, CBD 5mm, no signs of  inflammation  ASSESSMENT AND PLAN: Edwardine Deschepper is a 37 y.o. obese female with rather classic symptoms of symptomatic cholelithiasis without signs of cholecystitis or choledocholithiasis.  Currently she is able to tolerate liquids and small meals.  I will schedule her for an elective cholecystectomy.  She was told that if her pain gets worse and/or unbearable that she should present to the ED for further evaluation.    The anatomy & physiology of hepatobiliary & pancreatic function was discussed.  The pathophysiology of gallbladder dysfunction was discussed.  Natural history risks without surgery was discussed.   I feel the risks of no intervention will lead to serious problems that outweigh the operative risks; therefore, I  recommended cholecystectomy to remove the pathology.  I explained laparoscopic techniques with possible need for an open approach.  Probable cholangiogram to evaluate the bilary tract was explained as well.    Risks such as bleeding, infection, abscess, leak, injury to other organs, need for further treatment, heart attack, death, and other risks were discussed.  I noted a good likelihood this will help address the problem.  Possibility that this will not correct all abdominal symptoms was explained.  Goals of post-operative recovery were discussed as well.  We will work to minimize complications.  An educational handout further explaining the pathology and treatment options was given as well.  Questions were answered.  The patient expresses understanding & wishes to proceed with surgery.   Vanita Panda, MD Colon and Rectal Surgery / General Surgery [facility]      Visit Diagnoses: 1. Symptomatic cholelithiasis     Primary Care Physician: Judie Petit, MD

## 2012-05-08 NOTE — Anesthesia Postprocedure Evaluation (Signed)
  Anesthesia Post-op Note  Patient: Kara [name]  Procedure(s) Performed: Procedure(s) (LRB): LAPAROSCOPIC CHOLECYSTECTOMY WITH INTRAOPERATIVE CHOLANGIOGRAM (N/A)  Patient Location: PACU  Anesthesia Type: General  Level of Consciousness: awake and alert   Airway and Oxygen Therapy: Patient Spontanous Breathing  Post-op Pain: mild  Post-op Assessment: Post-op Vital signs reviewed, Patient's Cardiovascular Status Stable, Respiratory Function Stable, Patent Airway and No signs of Nausea or vomiting  Last Vitals:  Filed Vitals:   05/08/12 1415  BP: 125/79  Pulse: 65  Temp: 36.7 C  Resp: 14    Post-op Vital Signs: stable   Complications: No apparent anesthesia complications

## 2012-05-11 ENCOUNTER — Encounter (HOSPITAL_COMMUNITY): Payer: Self-pay | Admitting: General Surgery

## 2012-05-20 ENCOUNTER — Encounter (INDEPENDENT_AMBULATORY_CARE_PROVIDER_SITE_OTHER): Payer: Self-pay

## 2012-05-20 ENCOUNTER — Ambulatory Visit (INDEPENDENT_AMBULATORY_CARE_PROVIDER_SITE_OTHER): Payer: 59 | Admitting: General Surgery

## 2012-05-20 ENCOUNTER — Encounter (INDEPENDENT_AMBULATORY_CARE_PROVIDER_SITE_OTHER): Payer: Self-pay | Admitting: General Surgery

## 2012-05-20 VITALS — BP 126/80 | HR 80 | Temp 97.2°F | Resp 20 | Ht 67.0 in | Wt 381.4 lb

## 2012-05-20 DIAGNOSIS — Z9889 Other specified postprocedural states: Secondary | ICD-10-CM

## 2012-05-20 NOTE — Progress Notes (Signed)
Marlaysia Lenig is a 37 y.o. female who is status post a lap chole on 4/11.  She is doing well.  Her pain is better.  She is tolerating a diet and off her narcotics.  She denies constipation or diarrhea.  Objective: Filed Vitals:   05/20/12 1532  BP: 126/80  Pulse: 80  Temp: 97.2 F (36.2 C)  Resp: 20    General appearance: alert and cooperative GI: soft, non-tender; bowel sounds normal; no masses,  no organomegaly  Incision: healing well   Assessment: s/p  Patient Active Problem List  Diagnosis  . HYPOTHYROIDISM  . HYPERLIPIDEMIA  . OBESITY  . ANXIETY  . DEGENERATIVE JOINT DISEASE, GENERALIZED  . DEPRESSION    Plan: No heavy lifting for a total of 8 weeks after surgery. RTO PRN. Ok to return to work.    Vanita Panda, MD [facility], [place] [phone]   05/20/2012 4:23 PM

## 2012-05-20 NOTE — Patient Instructions (Signed)
No heavy lifting for a total of 8 weeks after surgery.  No diet restrictions.  Ok to return to work.

## 2012-06-11 ENCOUNTER — Other Ambulatory Visit: Payer: Self-pay | Admitting: Internal Medicine

## 2012-06-30 ENCOUNTER — Other Ambulatory Visit: Payer: Self-pay | Admitting: Family Medicine

## 2012-06-30 DIAGNOSIS — M25561 Pain in right knee: Secondary | ICD-10-CM

## 2012-07-05 ENCOUNTER — Other Ambulatory Visit: Payer: Self-pay | Admitting: Internal Medicine

## 2012-07-06 ENCOUNTER — Ambulatory Visit
Admission: RE | Admit: 2012-07-06 | Discharge: 2012-07-06 | Disposition: A | Discharge status: Home / Self Care | Payer: 59 | Source: Ambulatory Visit | Attending: Family Medicine | Admitting: Family Medicine

## 2012-07-06 DIAGNOSIS — M25561 Pain in right knee: Secondary | ICD-10-CM

## 2012-07-13 ENCOUNTER — Other Ambulatory Visit: Payer: Self-pay | Admitting: Internal Medicine

## 2012-07-17 ENCOUNTER — Telehealth: Payer: Self-pay

## 2012-07-17 NOTE — Telephone Encounter (Signed)
Kara [name] was in checking on Kim's prescription that was sent to [facility] - he said the sent it back for instructions. He is checking to see if we received it and has been sent back to them corrected. Will you check on this and call him back [phone]

## 2012-07-20 MED ORDER — TRAMADOL HCL 50 MG PO TABS: ORAL_TABLET | ORAL | Status: DC

## 2012-07-20 NOTE — Telephone Encounter (Signed)
Pt scheduled appt for 7/29.  Rx for Tramadol sent in electronically till appt

## 2012-07-20 NOTE — Addendum Note (Signed)
Addended by: Alfred Levins D on: 07/20/2012 12:17 PM   Modules accepted: Orders

## 2012-07-28 HISTORY — PX: KNEE ARTHROSCOPY: SUR90

## 2012-08-03 ENCOUNTER — Other Ambulatory Visit: Payer: Self-pay | Admitting: Internal Medicine

## 2012-08-10 ENCOUNTER — Telehealth: Payer: Self-pay | Admitting: Internal Medicine

## 2012-08-10 MED ORDER — LEVOTHYROXINE SODIUM 112 MCG PO TABS: 224.0000 ug | ORAL_TABLET | Freq: Every morning | ORAL | Status: DC

## 2012-08-10 NOTE — Telephone Encounter (Signed)
rx sent in electronically 

## 2012-08-10 NOTE — Telephone Encounter (Signed)
Pt needs refill of diclofenac (VOLTAREN) 50 MG EC tablet Pt had to cancel her 8/26 appt due to personal conflict. Can you refill this RX until next available 10/21? Pharm Art therapist

## 2012-08-20 ENCOUNTER — Other Ambulatory Visit: Payer: Self-pay | Admitting: *Deleted

## 2012-08-20 MED ORDER — DICLOFENAC SODIUM 50 MG PO TBEC: 50.0000 mg | DELAYED_RELEASE_TABLET | Freq: Two times a day (BID) | ORAL | Status: DC

## 2012-08-25 ENCOUNTER — Ambulatory Visit: Payer: 59 | Admitting: Internal Medicine

## 2012-08-26 ENCOUNTER — Encounter: Payer: Self-pay | Admitting: Internal Medicine

## 2012-08-26 ENCOUNTER — Ambulatory Visit (INDEPENDENT_AMBULATORY_CARE_PROVIDER_SITE_OTHER): Payer: 59 | Admitting: Internal Medicine

## 2012-08-26 VITALS — BP 118/84 | HR 100 | Temp 98.0°F | Wt 359.0 lb

## 2012-08-26 DIAGNOSIS — E669 Obesity, unspecified: Secondary | ICD-10-CM

## 2012-08-26 DIAGNOSIS — E039 Hypothyroidism, unspecified: Secondary | ICD-10-CM

## 2012-08-26 DIAGNOSIS — E785 Hyperlipidemia, unspecified: Secondary | ICD-10-CM

## 2012-08-26 LAB — LIPID PANEL
Cholesterol: 236 mg/dL — ABNORMAL HIGH (ref 0–200)
HDL: 35 mg/dL — ABNORMAL LOW (ref >39.00)
Total CHOL/HDL Ratio: 7
Triglycerides: 266 mg/dL — ABNORMAL HIGH (ref 0.0–149.0)
VLDL: 53.2 mg/dL — ABNORMAL HIGH (ref 0.0–40.0)

## 2012-08-26 LAB — BASIC METABOLIC PANEL
BUN: 16 mg/dL (ref 6–23)
Chloride: 108 mEq/L (ref 96–112)
GFR: 83.48 mL/min (ref >60.00)
Potassium: 4.9 mEq/L (ref 3.5–5.1)
Sodium: 141 mEq/L (ref 135–145)

## 2012-08-26 NOTE — Assessment & Plan Note (Signed)
This is her main issue- needs to lose weight-- daily exercise I'm supportive of weight loss intervention including surgery. This will likely resolve her orthopedic issues, hyperlipidemia  Her bmi puts her in the morbidly obese category.

## 2012-08-26 NOTE — Assessment & Plan Note (Signed)
Check tsh today 

## 2012-08-26 NOTE — Progress Notes (Signed)
Here for f/u  obesith-- has not lost weight but is interested in surgical weigh tloss and is signed up for one of their seminars  Lipids- not treated Lipid Panel     Component Value Date/Time   CHOL 210* 06/13/2011 0932   TRIG 153.0* 06/13/2011 0932   HDL 43.40 06/13/2011 0932   CHOLHDL 5 06/13/2011 0932   VLDL 30.6 06/13/2011 0932   LDLCALC 138* 12/17/2007 0812    Hypothyroid- needs f/u  Mood- on effexor  Past Medical History  Diagnosis Date  . ANXIETY 08/18/2006  . 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 DEPRESSION 02/22/2010  . HYPERLIPIDEMIA 06/11/2007  . HYPOTHYROIDISM 08/18/2006  . OBESITY 06/16/2008  . GERD (gastroesophageal reflux disease)   . Pneumonia     hx of     History   Social History  . Marital Status: Married    Spouse Name: N/A    Number of Children: N/A  . Years of Education: N/A   Occupational History  . Not on file.   Social History Main Topics  . Smoking status: Former Smoker    Types: Cigarettes    Quit date: 01/28/2005  . Smokeless tobacco: Never Used  . Alcohol Use: Yes     Comment: occ glass of wine or mixed drink   . Drug Use: No  . Sexually Active: Not on file   Other Topics Concern  . Not on file   Social History Narrative  . No narrative on file    Past Surgical History  Procedure Laterality Date  . Orif ulnar fracture    . Cholecystectomy N/A 05/08/2012    Procedure: LAPAROSCOPIC CHOLECYSTECTOMY WITH INTRAOPERATIVE CHOLANGIOGRAM;  Surgeon: Romie Levee, MD;  Location: [facility];  Service: General;  Laterality: N/A;  . Knee arthroscopy Left 07/28/12    dr. [name]    Family History  Problem Relation Age of Onset  . Schizophrenia Mother   . Cancer Mother     lung  . Heart disease Father     Allergies  Allergen Reactions  . Asa (Aspirin) Itching and Swelling    Current Outpatient Prescriptions on File Prior to Visit  Medication Sig Dispense Refill  . cetirizine (ZYRTEC) 10 MG tablet Take 10 mg by mouth daily.       . diclofenac (VOLTAREN) 50 MG EC tablet Take 1 tablet (50 mg total) by mouth 2 (two) times daily.  60 tablet  0  . levothyroxine (SYNTHROID, LEVOTHROID) 112 MCG tablet Take 2 tablets (224 mcg total) by mouth every morning.  90 tablet  1  . medroxyPROGESTERone (DEPO-PROVERA) 150 MG/ML injection Inject 150 mg into the muscle every 3 (three) months. Last dose was 04/23/12      . Multiple Vitamins-Minerals (ADULT ONE DAILY GUMMIES) CHEW Chew 2 tablets by mouth daily.       . traMADol (ULTRAM) 50 MG tablet TAKE ONE-HALF TO ONE TABLET BY MOUTH TWICE DAILY AS NEEDED FOR PAIN  30 tablet  1  . venlafaxine XR (EFFEXOR-XR) 75 MG 24 hr capsule TAKE ONE CAPSULE BY MOUTH ONCE DAILY  30 capsule  0   No current facility-administered medications on file prior to visit.     patient denies chest pain, shortness of breath, orthopnea. Denies lower extremity edema, abdominal pain, change in appetite, change in bowel movements. Patient denies rashes, musculoskeletal complaints. No other specific complaints in a complete review of systems.   BP 118/84  Pulse 100  Temp(Src) 98 F (36.7 C) (Oral)  Wt 359  lb (162.841 kg)  BMI 56.21 kg/m2  Well-developed well-nourished female in no acute distress. HEENT exam atraumatic, normocephalic, extraocular muscles are intact. Neck is supple. No jugular venous distention no thyromegaly. Chest clear to auscultation without increased work of breathing. Cardiac exam S1 and S2 are regular. Abdominal exam: morbidly obese, active bowel sounds, soft, nontender. Extremities no edema. Neurologic exam she is alert without any motor sensory deficits. Gait is normal.

## 2012-08-26 NOTE — Assessment & Plan Note (Signed)
Weight loss and exercise will help Check lipid panel today

## 2012-08-31 ENCOUNTER — Other Ambulatory Visit: Payer: Self-pay | Admitting: Internal Medicine

## 2012-09-14 ENCOUNTER — Other Ambulatory Visit: Payer: Self-pay | Admitting: Internal Medicine

## 2012-09-17 ENCOUNTER — Other Ambulatory Visit: Payer: Self-pay | Admitting: *Deleted

## 2012-09-17 MED ORDER — TRAMADOL HCL 50 MG PO TABS: ORAL_TABLET | ORAL | Status: DC

## 2012-09-17 MED ORDER — ATORVASTATIN CALCIUM 10 MG PO TABS: 10.0000 mg | ORAL_TABLET | Freq: Every day | ORAL | Status: DC

## 2012-09-22 ENCOUNTER — Ambulatory Visit: Payer: 59 | Admitting: Internal Medicine

## 2012-09-30 ENCOUNTER — Other Ambulatory Visit: Payer: Self-pay | Admitting: Internal Medicine

## 2012-10-01 ENCOUNTER — Encounter (INDEPENDENT_AMBULATORY_CARE_PROVIDER_SITE_OTHER): Payer: Self-pay | Admitting: General Surgery

## 2012-10-01 ENCOUNTER — Ambulatory Visit (INDEPENDENT_AMBULATORY_CARE_PROVIDER_SITE_OTHER): Payer: 59 | Admitting: General Surgery

## 2012-10-01 DIAGNOSIS — E039 Hypothyroidism, unspecified: Secondary | ICD-10-CM

## 2012-10-01 DIAGNOSIS — E785 Hyperlipidemia, unspecified: Secondary | ICD-10-CM

## 2012-10-01 LAB — CBC WITH DIFFERENTIAL/PLATELET
Basophils Absolute: 0.1 10*3/uL (ref 0.0–0.1)
Eosinophils Absolute: 0.5 10*3/uL (ref 0.0–0.7)
Eosinophils Relative: 4 % (ref 0–5)
HCT: 41.3 % (ref 36.0–46.0)
Lymphocytes Relative: 23 % (ref 12–46)
MCH: 28.9 pg (ref 26.0–34.0)
MCHC: 33.9 g/dL (ref 30.0–36.0)
MCV: 85.2 fL (ref 78.0–100.0)
Monocytes Absolute: 0.8 10*3/uL (ref 0.1–1.0)
Platelets: 379 10*3/uL (ref 150–400)
RDW: 14.3 % (ref 11.5–15.5)
WBC: 10.9 10*3/uL — ABNORMAL HIGH (ref 4.0–10.5)

## 2012-10-01 LAB — COMPREHENSIVE METABOLIC PANEL
ALT: 22 U/L (ref 0–35)
CO2: 25 mEq/L (ref 19–32)
Calcium: 9.2 mg/dL (ref 8.4–10.5)
Chloride: 105 mEq/L (ref 96–112)
Glucose, Bld: 94 mg/dL (ref 70–99)
Sodium: 138 mEq/L (ref 135–145)
Total Bilirubin: 0.7 mg/dL (ref 0.3–1.2)
Total Protein: 6.9 g/dL (ref 6.0–8.3)

## 2012-10-01 LAB — LIPID PANEL
HDL: 42 mg/dL (ref >39)
LDL Cholesterol: 83 mg/dL (ref 0–99)

## 2012-10-01 LAB — T4: T4, Total: 12.1 ug/dL (ref 5.0–12.5)

## 2012-10-01 LAB — TSH: TSH: 7.296 u[IU]/mL — ABNORMAL HIGH (ref 0.350–4.500)

## 2012-10-01 NOTE — Progress Notes (Signed)
Patient ID: Kara [name], female   DOB: [birth year], 37 y.o.   MRN: [id number]  Chief Complaint  Patient presents with  . Bariatric Pre-op    HPI Kara [name] is a 37 y.o. female.  This patient comes in today for her initial weight loss surgery evaluation. She has a tender information session and is interested in the gastric bypass. She has a BMI of 57 with obesity related comorbidities of hyperlipidemia and arthritis. She has struggled with her weight since she was 10 and was even put on the physician recommended that at age 6. Since then she has tried several diets including physician supervised diet and appetite suppressants. She has done Weight Watchers several times off and on for the last 14 years and has lost weight but always seems to regain the weight. She denies any heartburn symptoms ever said she had her gallbladder surgery and she says that she is current on all her wellness exams such as mammogram and well woman exam. She does have a hard time with exercise given the severe knee and hip and foot pain. HPI  Past Medical History  Diagnosis Date  . ANXIETY 08/18/2006  . 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 DEPRESSION 02/22/2010  . HYPERLIPIDEMIA 06/11/2007  . HYPOTHYROIDISM 08/18/2006  . OBESITY 06/16/2008  . GERD (gastroesophageal reflux disease)   . Pneumonia     hx of     Past Surgical History  Procedure Laterality Date  . Orif ulnar fracture    . Cholecystectomy N/A 05/08/2012    Procedure: LAPAROSCOPIC CHOLECYSTECTOMY WITH INTRAOPERATIVE CHOLANGIOGRAM;  Surgeon: Romie Levee, MD;  Location: [facility];  Service: General;  Laterality: N/A;  . Knee arthroscopy Left 07/28/12    dr. [name]    Family History  Problem Relation Age of Onset  . Schizophrenia Mother   . Cancer Mother     lung  . Heart disease Father     Social History History  Substance Use Topics  . Smoking status: Former Smoker    Types: Cigarettes    Quit date: 01/28/2005  . Smokeless  tobacco: Never Used  . Alcohol Use: Yes     Comment: occ glass of wine or mixed drink     Allergies  Allergen Reactions  . Asa [Aspirin] Itching and Swelling    Current Outpatient Prescriptions  Medication Sig Dispense Refill  . atorvastatin (LIPITOR) 10 MG tablet Take 1 tablet (10 mg total) by mouth daily.  90 tablet  3  . cetirizine (ZYRTEC) 10 MG tablet Take 10 mg by mouth daily.      . diclofenac (VOLTAREN) 50 MG EC tablet TAKE ONE TABLET BY MOUTH TWICE DAILY.  60 tablet  0  . levothyroxine (SYNTHROID, LEVOTHROID) 112 MCG tablet Take 2 tablets (224 mcg total) by mouth every morning.  90 tablet  1  . medroxyPROGESTERone (DEPO-PROVERA) 150 MG/ML injection Inject 150 mg into the muscle every 3 (three) months. Last dose was 04/23/12      . Multiple Vitamins-Minerals (ADULT ONE DAILY GUMMIES) CHEW Chew 2 tablets by mouth daily.       . traMADol (ULTRAM) 50 MG tablet TAKE ONE-HALF TO ONE TABLET BY MOUTH TWICE DAILY AS NEEDED FOR PAIN  60 tablet  3  . venlafaxine XR (EFFEXOR-XR) 75 MG 24 hr capsule TAKE ONE CAPSULE BY MOUTH ONCE DAILY  30 capsule  0   No current facility-administered medications for this visit.    Review of Systems Review of Systems All other review of systems  negative or noncontributory except as stated in the HPI  Blood pressure 123/81, pulse 80, temperature 97.7 F (36.5 C), temperature source Temporal, resp. rate 18, height 5\' 7"  (1.702 m), weight 364 lb 9.6 oz (165.381 kg).  Physical Exam Physical Exam Physical Exam  Nursing note and vitals reviewed. Constitutional: She is oriented to person, place, and time. She appears well-developed and well-nourished. No distress.  HENT:  Head: Normocephalic and atraumatic.  Mouth/Throat: No oropharyngeal exudate.  Eyes: Conjunctivae and EOM are normal. Pupils are equal, round, and reactive to light. Right eye exhibits no discharge. Left eye exhibits no discharge. No scleral icterus.  Neck: Normal range of motion. Neck  supple. No tracheal deviation present.  Cardiovascular: Normal rate, regular rhythm, normal heart sounds and intact distal pulses.   Pulmonary/Chest: Effort normal and breath sounds normal. No stridor. No respiratory distress. She has no wheezes.  Abdominal: Soft. Bowel sounds are normal. She exhibits no distension and no mass. There is no tenderness. There is no rebound and no guarding.  Musculoskeletal: Normal range of motion. She exhibits no edema and no tenderness.  Neurological: She is alert and oriented to person, place, and time.  Skin: Skin is warm and dry. No rash noted. She is not diaphoretic. No erythema. No pallor.  Psychiatric: She has a normal mood and affect. Her behavior is normal. Judgment and thought content normal.    Data Reviewed   Assessment    Morbid obesity with a BMI of 57 and obesity related comorbidities of hyperlipidemia and arthritis and hypothyroidism She has attended our information session and we had a long discussion today as well discussing all of the options for medical or surgical weight loss including a lap band, sleeve gastrectomy, and Roux-en-Y gastric bypass. She remains interested and weight loss surgery and would like to continue with plans for a gastric bypass. I think that this would be a good option for her given her weight and her significant arthritis which potentially may limit her ability to exercise postoperatively. I explained that her weight loss will be limited if she cannot augment this with physical activity. We discussed the risks of the procedure as well. The risks of infection, bleeding, pain, scarring, weight regain, too little or too much weight loss, vitamin deficiencies and need for lifelong vitamin supplementation, hair loss, need for protein supplementation, leaks, stricture, reflux, food intolerance, need for reoperation , need for open surgery, injury to spleen or surrounding structures, DVT's, PE, and death again discussed with the  patient and the patient expressed understanding and desires to proceed with RYGB.       Plan    We will set her up with preop labs, nutrition, and psychology evaluations and UGI and we will see her back when these are complete.       Lodema Pilot DAVID 10/01/2012, 11:55 AM

## 2012-10-06 ENCOUNTER — Telehealth (INDEPENDENT_AMBULATORY_CARE_PROVIDER_SITE_OTHER): Payer: Self-pay

## 2012-10-06 ENCOUNTER — Telehealth: Payer: Self-pay | Admitting: Internal Medicine

## 2012-10-06 NOTE — Telephone Encounter (Signed)
Called and left message for patient to call our office back RE:  Thyroid results from recent labs are abnormal and per Dr. Biagio Quint patient need's to see PCP for management of Synthroid/Thyroid.

## 2012-10-06 NOTE — Telephone Encounter (Signed)
Pt states that her TSH came back abnormal from 10/01/12. She is inquiring to see if Dr. Cato Mulligan would like to adjust her medication. She states that she does not have a copy of this records, as the nurse just called her with these results today. Please assist.

## 2012-10-06 NOTE — Telephone Encounter (Signed)
Message copied by Maryan Puls on Tue Oct 06, 2012  9:13 AM ------      Message from: Lodema Pilot      Created: Fri Oct 02, 2012 11:25 AM       She needs evaluation by PCP for thyroid.       ----- Message -----         From: [facility]         Sent: 10/01/2012   9:09 PM           To: Lodema Pilot, DO                   ------

## 2012-10-06 NOTE — Telephone Encounter (Signed)
Patient aware of abnormal TSH labs ,she will call PCP of appointment

## 2012-10-07 ENCOUNTER — Encounter: Payer: Self-pay | Admitting: *Deleted

## 2012-10-08 NOTE — Telephone Encounter (Signed)
Same medication- check in 3 months Make sure she is taking levothyroxine on an empty stomach

## 2012-10-09 NOTE — Telephone Encounter (Signed)
Fleet Contras pt return your call disregard cindy has already taken care of issue.

## 2012-10-09 NOTE — Telephone Encounter (Signed)
Left message on machine for patient to return our call 

## 2012-10-20 ENCOUNTER — Ambulatory Visit (INDEPENDENT_AMBULATORY_CARE_PROVIDER_SITE_OTHER): Payer: 59 | Admitting: Internal Medicine

## 2012-10-20 ENCOUNTER — Encounter: Payer: Self-pay | Admitting: Internal Medicine

## 2012-10-20 VITALS — BP 124/84 | Temp 98.5°F | Ht 67.0 in | Wt 364.0 lb

## 2012-10-20 DIAGNOSIS — Z23 Encounter for immunization: Secondary | ICD-10-CM

## 2012-10-20 DIAGNOSIS — E039 Hypothyroidism, unspecified: Secondary | ICD-10-CM

## 2012-10-20 MED ORDER — LEVOTHYROXINE SODIUM 125 MCG PO TABS: 250.0000 ug | ORAL_TABLET | Freq: Every day | ORAL | Status: DC

## 2012-10-20 MED ORDER — DICLOFENAC SODIUM 50 MG PO TBEC: 50.0000 mg | DELAYED_RELEASE_TABLET | Freq: Two times a day (BID) | ORAL | Status: DC

## 2012-10-21 ENCOUNTER — Encounter: Payer: 59 | Attending: General Surgery | Admitting: *Deleted

## 2012-10-21 ENCOUNTER — Encounter: Payer: Self-pay | Admitting: *Deleted

## 2012-10-21 VITALS — Ht 67.0 in | Wt 363.4 lb

## 2012-10-21 DIAGNOSIS — E669 Obesity, unspecified: Secondary | ICD-10-CM | POA: Insufficient documentation

## 2012-10-21 DIAGNOSIS — Z713 Dietary counseling and surveillance: Secondary | ICD-10-CM | POA: Insufficient documentation

## 2012-10-21 NOTE — Patient Instructions (Addendum)
   Follow Pre-Op Nutrition Goals to prepare for Gastric Bypass Surgery.   Call the [facility] at [phone] once you have been given your surgery date to enrolled in the Pre-Op Nutrition Class. You will need to attend this nutrition class 3-4 weeks prior to your surgery. 

## 2012-10-21 NOTE — Progress Notes (Signed)
  Pre-Op Assessment Visit:  Pre-Operative RYGB Surgery  Medical Nutrition Therapy:  Appt start time: 1145   End time:  1250.  Patient was seen on 10/21/2012 for Pre-Operative RYGB Nutrition Assessment. Assessment and letter of approval faxed to [facility] Bariatric Surgery Program coordinator on 10/21/2012.   Handouts given during visit include:  Pre-Op Goals Bariatric Surgery Protein Shakes  Patient to call the [facility] to enroll in Pre-Op and Post-Op Nutrition Education when surgery date is scheduled.

## 2012-10-22 ENCOUNTER — Ambulatory Visit: Payer: 59 | Admitting: *Deleted

## 2012-10-23 NOTE — Assessment & Plan Note (Signed)
Increase levothyroxine to 250 mcg po qd Recheck labs in 6 weeks

## 2012-10-23 NOTE — Progress Notes (Signed)
Brings in blood work regarding thyroid Patient is morbidly obese, takes levothyroxine po qd on an empty stomach.  Reviewed pmh, psh, sochx Reviewed bariatric notes and outside lab work  reiewed itals Unable to palpate thyroid Skin is supple Chest-CTA cv- reg rate

## 2012-10-29 ENCOUNTER — Other Ambulatory Visit: Payer: Self-pay

## 2012-10-29 ENCOUNTER — Ambulatory Visit (HOSPITAL_COMMUNITY)
Admission: RE | Admit: 2012-10-29 | Discharge: 2012-10-29 | Disposition: A | Discharge status: Home / Self Care | Payer: 59 | Source: Ambulatory Visit | Attending: General Surgery | Admitting: General Surgery

## 2012-10-29 DIAGNOSIS — E785 Hyperlipidemia, unspecified: Secondary | ICD-10-CM | POA: Insufficient documentation

## 2012-10-29 DIAGNOSIS — E039 Hypothyroidism, unspecified: Secondary | ICD-10-CM

## 2012-10-29 DIAGNOSIS — M1990 Unspecified osteoarthritis, unspecified site: Secondary | ICD-10-CM | POA: Insufficient documentation

## 2012-10-29 DIAGNOSIS — Z9089 Acquired absence of other organs: Secondary | ICD-10-CM | POA: Insufficient documentation

## 2012-10-29 DIAGNOSIS — K219 Gastro-esophageal reflux disease without esophagitis: Secondary | ICD-10-CM | POA: Insufficient documentation

## 2012-10-29 DIAGNOSIS — Z6841 Body Mass Index (BMI) 40.0 and over, adult: Secondary | ICD-10-CM | POA: Insufficient documentation

## 2012-10-29 DIAGNOSIS — K449 Diaphragmatic hernia without obstruction or gangrene: Secondary | ICD-10-CM | POA: Insufficient documentation

## 2012-11-02 ENCOUNTER — Other Ambulatory Visit: Payer: Self-pay | Admitting: Internal Medicine

## 2012-11-12 ENCOUNTER — Ambulatory Visit: Payer: 59 | Admitting: *Deleted

## 2012-11-17 ENCOUNTER — Other Ambulatory Visit (INDEPENDENT_AMBULATORY_CARE_PROVIDER_SITE_OTHER): Payer: 59

## 2012-11-17 ENCOUNTER — Ambulatory Visit: Payer: 59 | Admitting: Internal Medicine

## 2012-11-17 DIAGNOSIS — E039 Hypothyroidism, unspecified: Secondary | ICD-10-CM

## 2012-11-17 LAB — TSH: TSH: 1.63 u[IU]/mL (ref 0.35–5.50)

## 2012-11-26 ENCOUNTER — Ambulatory Visit: Payer: 59 | Admitting: *Deleted

## 2012-12-09 ENCOUNTER — Ambulatory Visit: Payer: 59

## 2012-12-09 ENCOUNTER — Other Ambulatory Visit (INDEPENDENT_AMBULATORY_CARE_PROVIDER_SITE_OTHER): Payer: Self-pay

## 2012-12-09 DIAGNOSIS — Z01818 Encounter for other preprocedural examination: Secondary | ICD-10-CM

## 2012-12-09 NOTE — Progress Notes (Signed)
Dr Biagio Quint-  NEED PRE OP ORDERS PLEASE      HAS PST APPT 12/17/12  [facility]

## 2012-12-10 ENCOUNTER — Telehealth (INDEPENDENT_AMBULATORY_CARE_PROVIDER_SITE_OTHER): Payer: Self-pay

## 2012-12-10 ENCOUNTER — Encounter: Payer: 59 | Attending: General Surgery

## 2012-12-10 DIAGNOSIS — Z713 Dietary counseling and surveillance: Secondary | ICD-10-CM | POA: Insufficient documentation

## 2012-12-10 DIAGNOSIS — E669 Obesity, unspecified: Secondary | ICD-10-CM | POA: Insufficient documentation

## 2012-12-10 NOTE — Telephone Encounter (Signed)
Called and left message for patient to call our office.  Patient need's H-Pylori Test before she can proceed with surgery scheduled on 12/29/2012.  Order's are in EPIC, patient can go to [employer].

## 2012-12-10 NOTE — Telephone Encounter (Signed)
Spoke to patient regarding bloodwork (H-Pylori test) patient will try to go this evening if not then she will go tomorrow.  Patient aware that labs must be drawn.

## 2012-12-11 ENCOUNTER — Other Ambulatory Visit (INDEPENDENT_AMBULATORY_CARE_PROVIDER_SITE_OTHER): Payer: Self-pay | Admitting: General Surgery

## 2012-12-11 ENCOUNTER — Encounter (INDEPENDENT_AMBULATORY_CARE_PROVIDER_SITE_OTHER): Payer: Self-pay | Admitting: General Surgery

## 2012-12-11 ENCOUNTER — Ambulatory Visit (INDEPENDENT_AMBULATORY_CARE_PROVIDER_SITE_OTHER): Payer: 59 | Admitting: General Surgery

## 2012-12-11 NOTE — Progress Notes (Signed)
Patient ID: Kara [name], female   DOB: [birth year], 37 y.o.   MRN: [id number]  Chief Complaint  Patient presents with  . Bariatric Pre-op    HPI Kara [name] is a 37 y.o. female.   HPIthis patient presents for her preoperative surgery evaluation and preparation for Roux-en-Y gastric bypass. She has a BMI of 57 with obesity with a commodities of hyperlipidemia, arthritis, and hypothyroidism. She has completed her psychological nutrition evaluation. Her thyroid labs were abnormal initially these have since been corrected. She has not started her preoperative diet but is scheduled to start this next week.  Past Medical History  Diagnosis Date  . ANXIETY 08/18/2006  . 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 DEPRESSION 02/22/2010  . HYPERLIPIDEMIA 06/11/2007  . HYPOTHYROIDISM 08/18/2006  . OBESITY 06/16/2008  . GERD (gastroesophageal reflux disease)   . Pneumonia     hx of     Past Surgical History  Procedure Laterality Date  . Orif ulnar fracture    . Cholecystectomy N/A 05/08/2012    Procedure: LAPAROSCOPIC CHOLECYSTECTOMY WITH INTRAOPERATIVE CHOLANGIOGRAM;  Surgeon: Romie Levee, MD;  Location: [facility];  Service: General;  Laterality: N/A;  . Knee arthroscopy Left 07/28/12    dr. [name]    Family History  Problem Relation Age of Onset  . Schizophrenia Mother   . Cancer Mother     lung  . Heart disease Father     Social History History  Substance Use Topics  . Smoking status: Former Smoker    Types: Cigarettes    Quit date: 01/28/2005  . Smokeless tobacco: Never Used  . Alcohol Use: Yes     Comment: occ glass of wine or mixed drink     Allergies  Allergen Reactions  . Asa [Aspirin] Itching and Swelling    Current Outpatient Prescriptions  Medication Sig Dispense Refill  . atorvastatin (LIPITOR) 10 MG tablet Take 1 tablet (10 mg total) by mouth daily.  90 tablet  3  . cetirizine (ZYRTEC) 10 MG tablet Take 10 mg by mouth daily.      . diclofenac (VOLTAREN)  50 MG EC tablet Take 1 tablet (50 mg total) by mouth 2 (two) times daily.  60 tablet  3  . levothyroxine (SYNTHROID, LEVOTHROID) 125 MCG tablet Take 2 tablets (250 mcg total) by mouth daily.  180 tablet  3  . medroxyPROGESTERone (DEPO-PROVERA) 150 MG/ML injection Inject 150 mg into the muscle every 3 (three) months. Last dose was 04/23/12      . Multiple Vitamins-Minerals (ADULT ONE DAILY GUMMIES) CHEW Chew 2 tablets by mouth daily.       . traMADol (ULTRAM) 50 MG tablet TAKE ONE-HALF TO ONE TABLET BY MOUTH TWICE DAILY AS NEEDED FOR PAIN  60 tablet  3  . venlafaxine XR (EFFEXOR-XR) 75 MG 24 hr capsule TAKE ONE CAPSULE BY MOUTH ONCE DAILY.  30 capsule  5   No current facility-administered medications for this visit.    Review of Systems Review of Systems All other review of systems negative or noncontributory except as stated in the HPI  Blood pressure 140/100, pulse 92, temperature 97.9 F (36.6 C), temperature source Temporal, resp. rate 16, height 5\' 7"  (1.702 m), weight 365 lb (165.563 kg).  Physical Exam Physical Exam Physical Exam  Nursing note and vitals reviewed. Constitutional: She is oriented to person, place, and time. She appears well-developed and well-nourished. No distress.  HENT:  Head: Normocephalic and atraumatic.  Mouth/Throat: No oropharyngeal exudate.  Eyes: Conjunctivae and EOM  are normal. Pupils are equal, round, and reactive to light. Right eye exhibits no discharge. Left eye exhibits no discharge. No scleral icterus.  Neck: Normal range of motion. Neck supple. No tracheal deviation present.  Cardiovascular: Normal rate, regular rhythm, normal heart sounds and intact distal pulses.   Pulmonary/Chest: Effort normal and breath sounds normal. No stridor. No respiratory distress. She has no wheezes.  Abdominal: Soft. Bowel sounds are normal. She exhibits no distension and no mass. There is no tenderness. There is no rebound and no guarding.  Musculoskeletal: Normal  range of motion. She exhibits no edema and no tenderness.  Neurological: She is alert and oriented to person, place, and time.  Skin: Skin is warm and dry. No rash noted. She is not diaphoretic. No erythema. No pallor.  Psychiatric: She has a normal mood and affect. Her behavior is normal. Judgment and thought content normal.    Data Reviewed   Assessment    Obesity with BMI of 57 and obesity related comorbidities of hyperlipidemia and arthritis and hypothyroidism We can discuss the options for medical and surgical weight loss including the LAP-BAND, sleeve gastrectomy, and Roux-en-Y gastric bypass she remained intubated and Roux-en-Y gastric bypass. She has completed her preoperative workup and remains motivated interested in Roux-en-Y gastric bypass. We discussed the pros and cons of the procedure as well as the risks and benefits. The risks of infection, bleeding, pain, scarring, weight regain, too little or too much weight loss, vitamin deficiencies and need for lifelong vitamin supplementation, hair loss, need for protein supplementation, leaks, stricture, reflux, food intolerance, need for reoperation , need for open surgery, injury to spleen or surrounding structures, DVT's, PE, and death again discussed with the patient and the patient expressed understanding and desires to proceed with laparoscopic RYGB, possible open, intraoperative endoscopy.  We also discussed the perioperative course and expectations.      Plan    We will plan for Roux-en-Y gastric bypass. I did explain that I will be leaving the week after surgery and she will need to followup from my partners if there is any complication and for her routine followup        [name] 12/11/2012, 11:19 AM

## 2012-12-11 NOTE — Progress Notes (Signed)
Pre-Operative Nursing and Nutrition Class:  Appt start time: 1730   End time:  1930. Patient was seen on 12/10/12 for Pre-Operative Bariatric Surgery Education at [facility].  Surgery date: 12/29/12 Surgery type: Gastric Bypass The following the learning objectives were met by the patient during this course: Identify Pre-Op Dietary Goals and will begin 2 weeks pre-operatively Identify appropriate sources of fluids and proteins   State protein recommendations and appropriate sources pre and post-operatively Identify Post-Operative Dietary Goals and will follow for 2 weeks post-operatively Identify appropriate multivitamin and calcium sources Describe the need for physical activity post-operatively and will follow MD recommendations State when to call healthcare provider regarding medication questions or post-operative complications Handouts given during class include: Pre-Op Bariatric Surgery Diet Handout Protein Shake Handout Post-Op Bariatric Surgery Nutrition Handout BELT Program Information Flyer Support Group Information Flyer [facility] Bariatric Supplements Price List Follow-Up Plan: Patient will follow-up at [facility] 2 weeks post operatively for diet advancement per MD. Patient Instructions Follow:  Pre-Op Diet per MD 2 weeks prior to surgery   Phase 2- Liquids (clear/full) 2 weeks after surgery   Vitamin/Mineral/Calcium guidelines for purchasing bariatric supplements   Exercise guidelines pre and post-op per MD Follow-up at [facility] in 2 weeks post-op for diet advancement. Contact [facility] at [phone] or Skip Estimable [phone] [email or web address] with any questions or concerns Follow-up and Disposition    Return in about 5 weeks (01/12/13) for 2 Week Post-Op Class.

## 2012-12-14 ENCOUNTER — Telehealth (INDEPENDENT_AMBULATORY_CARE_PROVIDER_SITE_OTHER): Payer: Self-pay

## 2012-12-14 NOTE — Telephone Encounter (Signed)
Called and left patient a message (H-Pylori results are negative)

## 2012-12-16 ENCOUNTER — Encounter (HOSPITAL_COMMUNITY): Payer: Self-pay | Admitting: Pharmacy Technician

## 2012-12-17 ENCOUNTER — Encounter (HOSPITAL_COMMUNITY): Payer: Self-pay

## 2012-12-17 ENCOUNTER — Encounter (HOSPITAL_COMMUNITY)
Admission: RE | Admit: 2012-12-17 | Discharge: 2012-12-17 | Disposition: A | Discharge status: Home / Self Care | Payer: 59 | Source: Ambulatory Visit | Attending: General Surgery | Admitting: General Surgery

## 2012-12-17 DIAGNOSIS — Z01812 Encounter for preprocedural laboratory examination: Secondary | ICD-10-CM | POA: Insufficient documentation

## 2012-12-17 HISTORY — DX: Other specified postprocedural states: R11.2

## 2012-12-17 HISTORY — DX: Other specified postprocedural states: Z98.890

## 2012-12-17 LAB — CBC WITH DIFFERENTIAL/PLATELET
Basophils Absolute: 0.1 10*3/uL (ref 0.0–0.1)
Basophils Relative: 1 % (ref 0–1)
Eosinophils Absolute: 0.4 10*3/uL (ref 0.0–0.7)
Eosinophils Relative: 6 % — ABNORMAL HIGH (ref 0–5)
HCT: 42.7 % (ref 36.0–46.0)
MCH: 29.3 pg (ref 26.0–34.0)
MCHC: 33.7 g/dL (ref 30.0–36.0)
MCV: 86.8 fL (ref 78.0–100.0)
Monocytes Absolute: 0.7 10*3/uL (ref 0.1–1.0)
Monocytes Relative: 11 % (ref 3–12)
Platelets: 333 10*3/uL (ref 150–400)
RDW: 13.4 % (ref 11.5–15.5)

## 2012-12-17 LAB — COMPREHENSIVE METABOLIC PANEL
ALT: 24 U/L (ref 0–35)
AST: 20 U/L (ref 0–37)
Albumin: 4 g/dL (ref 3.5–5.2)
BUN: 19 mg/dL (ref 6–23)
Calcium: 9.6 mg/dL (ref 8.4–10.5)
Creatinine, Ser: 0.73 mg/dL (ref 0.50–1.10)
GFR calc Af Amer: >90 mL/min (ref >90)
Potassium: 4.2 mEq/L (ref 3.5–5.1)
Total Bilirubin: 0.7 mg/dL (ref 0.3–1.2)

## 2012-12-17 LAB — HCG, SERUM, QUALITATIVE: Preg, Serum: NEGATIVE

## 2012-12-17 NOTE — Progress Notes (Signed)
Consulted Dr.Fortune in Anesthesia and will see patient am of surgery!Marland Kitchen

## 2012-12-17 NOTE — Patient Instructions (Addendum)
20      Your procedure is scheduled on:  Tuesday 12/29/2012  Report to [facility] at  0525 AM.  Call this number if you have problems the night before or morning of surgery:   [phone]   Remember:             IF YOU USE CPAP,BRING MASK AND TUBING AM OF SURGERY!   Do not eat food or drink liquids AFTER MIDNIGHT!  Take these medicines the morning of surgery with A SIP OF WATER: Levothyroxine   Do not bring valuables to the hospital. [place] IS NOT RESPONSIBLE  FOR ANY BELONGINGS OR VALUABLES BROUGHT TO HOSPITAL.  Marland Kitchen  Leave suitcase in the car. After surgery it may be brought to your room.  For patients admitted to the hospital, checkout time is 11:00 AM the day of              Discharge.    DO NOT WEAR JEWELRY,MAKE-UP,LOTIONS,POWDERS,PERFUMES,CONTACTS , DENTURES OR BRIDGEWORK ,AND DO NOT WEAR FALSE EYELASHES                                    Patients discharged the day of surgery will not be allowed to drive home. If going home the same day of surgery, must have someone stay with you first 24 hrs.at home and arrange for someone to drive you home from the Hospital.                         YOUR DRIVER IS: N/A   Special Instructions: IF YOU DO NOT HAVE YOUR TYPE AND SCREEN DRAWN THE DAY OF YOU PRE-OP VISIT, YOU WILL HAVE IT DRAWN THE AM OF SURGERY!             Please read over the following fact sheets that you were given:             1. [facility] PREPARING FOR SURGERY SHEET              2.[facility] [employer]                                        [place].Malayzia Laforte,RN,BSN     [phone]                FAILURE TO FOLLOW THESE INSTRUCTIONS MAY RESULT IN  CANCELLATION OF YOUR SURGERY!               Patient Signature:___________________________

## 2012-12-29 ENCOUNTER — Inpatient Hospital Stay (HOSPITAL_COMMUNITY): Payer: 59 | Admitting: Anesthesiology

## 2012-12-29 ENCOUNTER — Inpatient Hospital Stay (HOSPITAL_COMMUNITY)
Admission: RE | Admit: 2012-12-29 | Discharge: 2012-12-31 | DRG: 621 | Disposition: A | Discharge status: Home / Self Care | Payer: 59 | Source: Ambulatory Visit | Attending: General Surgery | Admitting: General Surgery

## 2012-12-29 ENCOUNTER — Encounter (HOSPITAL_COMMUNITY): Payer: 59 | Admitting: Anesthesiology

## 2012-12-29 ENCOUNTER — Encounter (HOSPITAL_COMMUNITY): Payer: Self-pay | Admitting: *Deleted

## 2012-12-29 ENCOUNTER — Encounter (HOSPITAL_COMMUNITY)
Admission: RE | Disposition: A | Discharge status: Home / Self Care | Payer: Self-pay | Source: Ambulatory Visit | Attending: General Surgery

## 2012-12-29 DIAGNOSIS — Z818 Family history of other mental and behavioral disorders: Secondary | ICD-10-CM

## 2012-12-29 DIAGNOSIS — E039 Hypothyroidism, unspecified: Secondary | ICD-10-CM | POA: Diagnosis present

## 2012-12-29 DIAGNOSIS — E785 Hyperlipidemia, unspecified: Secondary | ICD-10-CM | POA: Diagnosis present

## 2012-12-29 DIAGNOSIS — F329 Major depressive disorder, single episode, unspecified: Secondary | ICD-10-CM | POA: Diagnosis present

## 2012-12-29 DIAGNOSIS — F411 Generalized anxiety disorder: Secondary | ICD-10-CM | POA: Diagnosis present

## 2012-12-29 DIAGNOSIS — Z79899 Other long term (current) drug therapy: Secondary | ICD-10-CM

## 2012-12-29 DIAGNOSIS — Z6841 Body Mass Index (BMI) 40.0 and over, adult: Secondary | ICD-10-CM

## 2012-12-29 DIAGNOSIS — F3289 Other specified depressive episodes: Secondary | ICD-10-CM | POA: Diagnosis present

## 2012-12-29 DIAGNOSIS — K219 Gastro-esophageal reflux disease without esophagitis: Secondary | ICD-10-CM | POA: Diagnosis present

## 2012-12-29 DIAGNOSIS — Z801 Family history of malignant neoplasm of trachea, bronchus and lung: Secondary | ICD-10-CM

## 2012-12-29 DIAGNOSIS — Z87891 Personal history of nicotine dependence: Secondary | ICD-10-CM

## 2012-12-29 DIAGNOSIS — Z8249 Family history of ischemic heart disease and other diseases of the circulatory system: Secondary | ICD-10-CM

## 2012-12-29 HISTORY — PX: UPPER GI ENDOSCOPY: SHX6162

## 2012-12-29 HISTORY — PX: GASTRIC ROUX-EN-Y: SHX5262

## 2012-12-29 SURGERY — LAPAROSCOPIC ROUX-EN-Y GASTRIC BYPASS WITH UPPER ENDOSCOPY
Anesthesia: General | Site: Esophagus

## 2012-12-29 MED ORDER — PROPOFOL 10 MG/ML IV BOLUS
INTRAVENOUS | Status: DC | PRN
  Administered 2012-12-29: 200 mg via INTRAVENOUS

## 2012-12-29 MED ORDER — BUPIVACAINE HCL (PF) 0.25 % IJ SOLN
INTRAMUSCULAR | Status: AC
  Filled 2012-12-29: qty 30

## 2012-12-29 MED ORDER — SCOPOLAMINE 1 MG/3DAYS TD PT72
MEDICATED_PATCH | TRANSDERMAL | Status: AC
  Filled 2012-12-29: qty 1

## 2012-12-29 MED ORDER — MIDAZOLAM HCL 5 MG/5ML IJ SOLN
INTRAMUSCULAR | Status: DC | PRN
  Administered 2012-12-29: 2 mg via INTRAVENOUS

## 2012-12-29 MED ORDER — PANTOPRAZOLE SODIUM 40 MG IV SOLR
40.0000 mg | INTRAVENOUS | Status: DC
  Administered 2012-12-29 – 2012-12-30 (×2): 40 mg via INTRAVENOUS
  Filled 2012-12-29 (×3): qty 40

## 2012-12-29 MED ORDER — BUPIVACAINE HCL 0.25 % IJ SOLN
INTRAMUSCULAR | Status: DC | PRN
  Administered 2012-12-29: 27.5 mL

## 2012-12-29 MED ORDER — GLYCOPYRROLATE 0.2 MG/ML IJ SOLN
INTRAMUSCULAR | Status: DC | PRN
  Administered 2012-12-29: 0.6 mg via INTRAVENOUS

## 2012-12-29 MED ORDER — FENTANYL CITRATE 0.05 MG/ML IJ SOLN
INTRAMUSCULAR | Status: AC
  Filled 2012-12-29: qty 5

## 2012-12-29 MED ORDER — ROCURONIUM BROMIDE 100 MG/10ML IV SOLN
INTRAVENOUS | Status: AC
  Filled 2012-12-29: qty 1

## 2012-12-29 MED ORDER — KCL IN DEXTROSE-NACL 20-5-0.45 MEQ/L-%-% IV SOLN
INTRAVENOUS | Status: DC
  Administered 2012-12-29 – 2012-12-31 (×4): via INTRAVENOUS
  Filled 2012-12-29 (×8): qty 1000

## 2012-12-29 MED ORDER — ONDANSETRON HCL 4 MG/2ML IJ SOLN
4.0000 mg | INTRAMUSCULAR | Status: DC | PRN
  Administered 2012-12-29 – 2012-12-30 (×4): 4 mg via INTRAVENOUS
  Filled 2012-12-29 (×5): qty 2

## 2012-12-29 MED ORDER — MIDAZOLAM HCL 2 MG/2ML IJ SOLN
INTRAMUSCULAR | Status: AC
  Filled 2012-12-29: qty 2

## 2012-12-29 MED ORDER — LIDOCAINE HCL (CARDIAC) 20 MG/ML IV SOLN
INTRAVENOUS | Status: DC | PRN
  Administered 2012-12-29: 100 mg via INTRAVENOUS

## 2012-12-29 MED ORDER — HEPARIN SODIUM (PORCINE) 5000 UNIT/ML IJ SOLN
5000.0000 [IU] | Freq: Once | INTRAMUSCULAR | Status: AC
  Administered 2012-12-29: 5000 [IU] via SUBCUTANEOUS
  Filled 2012-12-29: qty 1

## 2012-12-29 MED ORDER — LACTATED RINGERS IV SOLN
INTRAVENOUS | Status: DC | PRN
  Administered 2012-12-29 (×3): via INTRAVENOUS

## 2012-12-29 MED ORDER — UNJURY CHICKEN SOUP POWDER: 2.0000 [oz_av] | Freq: Four times a day (QID) | ORAL | Status: DC

## 2012-12-29 MED ORDER — ACETAMINOPHEN 160 MG/5ML PO SOLN: 650.0000 mg | ORAL | Status: DC | PRN

## 2012-12-29 MED ORDER — LEVOTHYROXINE SODIUM 125 MCG PO TABS
250.0000 ug | ORAL_TABLET | Freq: Every day | ORAL | Status: DC
  Administered 2012-12-30 – 2012-12-31 (×2): 250 ug via ORAL
  Filled 2012-12-29 (×3): qty 2

## 2012-12-29 MED ORDER — LACTATED RINGERS IV SOLN: INTRAVENOUS | Status: DC

## 2012-12-29 MED ORDER — HYDROMORPHONE HCL PF 2 MG/ML IJ SOLN
INTRAMUSCULAR | Status: AC
  Filled 2012-12-29: qty 1

## 2012-12-29 MED ORDER — UNJURY CHOCOLATE CLASSIC POWDER
2.0000 [oz_av] | Freq: Four times a day (QID) | ORAL | Status: DC
  Administered 2012-12-31: 2 [oz_av] via ORAL

## 2012-12-29 MED ORDER — OXYCODONE HCL 5 MG PO TABS: 5.0000 mg | ORAL_TABLET | Freq: Once | ORAL | Status: DC | PRN

## 2012-12-29 MED ORDER — PROPOFOL 10 MG/ML IV BOLUS
INTRAVENOUS | Status: AC
  Filled 2012-12-29: qty 20

## 2012-12-29 MED ORDER — SODIUM CHLORIDE 0.9 % IV SOLN
1.0000 g | INTRAVENOUS | Status: AC
  Administered 2012-12-29: 1 g via INTRAVENOUS

## 2012-12-29 MED ORDER — NEOSTIGMINE METHYLSULFATE 1 MG/ML IJ SOLN
INTRAMUSCULAR | Status: AC
  Filled 2012-12-29: qty 10

## 2012-12-29 MED ORDER — SUCCINYLCHOLINE CHLORIDE 20 MG/ML IJ SOLN
INTRAMUSCULAR | Status: DC | PRN
  Administered 2012-12-29: 160 mg via INTRAVENOUS

## 2012-12-29 MED ORDER — FENTANYL CITRATE 0.05 MG/ML IJ SOLN
INTRAMUSCULAR | Status: AC
  Filled 2012-12-29: qty 2

## 2012-12-29 MED ORDER — MORPHINE SULFATE 2 MG/ML IJ SOLN
2.0000 mg | INTRAMUSCULAR | Status: DC | PRN
  Administered 2012-12-29: 2 mg via INTRAVENOUS
  Administered 2012-12-29 – 2012-12-30 (×3): 4 mg via INTRAVENOUS
  Filled 2012-12-29 (×4): qty 2

## 2012-12-29 MED ORDER — OXYCODONE HCL 5 MG/5ML PO SOLN
5.0000 mg | ORAL | Status: DC | PRN
  Administered 2012-12-30 – 2012-12-31 (×3): 10 mg via ORAL
  Filled 2012-12-29: qty 10
  Filled 2012-12-29: qty 5
  Filled 2012-12-29: qty 10
  Filled 2012-12-29: qty 5

## 2012-12-29 MED ORDER — ROCURONIUM BROMIDE 100 MG/10ML IV SOLN
INTRAVENOUS | Status: DC | PRN
  Administered 2012-12-29 (×2): 10 mg via INTRAVENOUS
  Administered 2012-12-29: 40 mg via INTRAVENOUS

## 2012-12-29 MED ORDER — SODIUM CHLORIDE 0.9 % IV SOLN
INTRAVENOUS | Status: AC
  Filled 2012-12-29: qty 1

## 2012-12-29 MED ORDER — SCOPOLAMINE 1 MG/3DAYS TD PT72
MEDICATED_PATCH | TRANSDERMAL | Status: DC | PRN
  Administered 2012-12-29: 1.5 mg via TRANSDERMAL

## 2012-12-29 MED ORDER — LIDOCAINE-EPINEPHRINE (PF) 1 %-1:200000 IJ SOLN
INTRAMUSCULAR | Status: AC
  Filled 2012-12-29: qty 10

## 2012-12-29 MED ORDER — LIDOCAINE-EPINEPHRINE 1 %-1:100000 IJ SOLN
INTRAMUSCULAR | Status: DC | PRN
  Administered 2012-12-29: 27.5 mL

## 2012-12-29 MED ORDER — MEPERIDINE HCL 50 MG/ML IJ SOLN: 6.2500 mg | INTRAMUSCULAR | Status: DC | PRN

## 2012-12-29 MED ORDER — LACTATED RINGERS IR SOLN
Status: DC | PRN
  Administered 2012-12-29: 3000 mL

## 2012-12-29 MED ORDER — NEOSTIGMINE METHYLSULFATE 1 MG/ML IJ SOLN
INTRAMUSCULAR | Status: DC | PRN
  Administered 2012-12-29: 4 mg via INTRAVENOUS

## 2012-12-29 MED ORDER — TISSEEL VH 10 ML EX KIT
PACK | CUTANEOUS | Status: AC
  Filled 2012-12-29: qty 2

## 2012-12-29 MED ORDER — LIDOCAINE HCL (CARDIAC) 20 MG/ML IV SOLN
INTRAVENOUS | Status: AC
  Filled 2012-12-29: qty 5

## 2012-12-29 MED ORDER — FENTANYL CITRATE 0.05 MG/ML IJ SOLN
INTRAMUSCULAR | Status: DC | PRN
  Administered 2012-12-29: 100 ug via INTRAVENOUS
  Administered 2012-12-29 (×5): 50 ug via INTRAVENOUS

## 2012-12-29 MED ORDER — DEXAMETHASONE SODIUM PHOSPHATE 10 MG/ML IJ SOLN
INTRAMUSCULAR | Status: AC
  Filled 2012-12-29: qty 1

## 2012-12-29 MED ORDER — GLYCOPYRROLATE 0.2 MG/ML IJ SOLN
INTRAMUSCULAR | Status: AC
  Filled 2012-12-29: qty 3

## 2012-12-29 MED ORDER — ONDANSETRON HCL 4 MG/2ML IJ SOLN
INTRAMUSCULAR | Status: DC | PRN
  Administered 2012-12-29: 4 mg via INTRAVENOUS

## 2012-12-29 MED ORDER — PROMETHAZINE HCL 25 MG/ML IJ SOLN: 6.2500 mg | INTRAMUSCULAR | Status: DC | PRN

## 2012-12-29 MED ORDER — HYDROMORPHONE HCL PF 1 MG/ML IJ SOLN
INTRAMUSCULAR | Status: DC | PRN
  Administered 2012-12-29 (×2): 1 mg via INTRAVENOUS

## 2012-12-29 MED ORDER — HYDROMORPHONE HCL PF 1 MG/ML IJ SOLN: 0.2500 mg | INTRAMUSCULAR | Status: DC | PRN

## 2012-12-29 MED ORDER — ENOXAPARIN SODIUM 40 MG/0.4ML ~~LOC~~ SOLN
40.0000 mg | Freq: Two times a day (BID) | SUBCUTANEOUS | Status: DC
  Administered 2012-12-30 – 2012-12-31 (×3): 40 mg via SUBCUTANEOUS
  Filled 2012-12-29 (×5): qty 0.4

## 2012-12-29 MED ORDER — DEXAMETHASONE SODIUM PHOSPHATE 10 MG/ML IJ SOLN
INTRAMUSCULAR | Status: DC | PRN
  Administered 2012-12-29: 10 mg via INTRAVENOUS

## 2012-12-29 MED ORDER — UNJURY VANILLA POWDER: 2.0000 [oz_av] | Freq: Four times a day (QID) | ORAL | Status: DC

## 2012-12-29 MED ORDER — SUCCINYLCHOLINE CHLORIDE 20 MG/ML IJ SOLN
INTRAMUSCULAR | Status: AC
  Filled 2012-12-29: qty 2

## 2012-12-29 MED ORDER — OXYCODONE HCL 5 MG/5ML PO SOLN
5.0000 mg | Freq: Once | ORAL | Status: DC | PRN
  Filled 2012-12-29: qty 5

## 2012-12-29 MED ORDER — ONDANSETRON HCL 4 MG/2ML IJ SOLN
INTRAMUSCULAR | Status: AC
  Filled 2012-12-29: qty 2

## 2012-12-29 SURGICAL SUPPLY — 64 items
APPLIER CLIP ROT 13.4 12 LRG (CLIP)
APPLIER CLIP UNV 5X34 EPIX (ENDOMECHANICALS) IMPLANT
BIOPATCH WHT 1IN DISK W/4.0 H (GAUZE/BANDAGES/DRESSINGS) ×3 IMPLANT
BLADE SURG 15 STRL LF DISP TIS (BLADE) ×2 IMPLANT
BLADE SURG 15 STRL SS (BLADE) ×1
CABLE HIGH FREQUENCY MONO STRZ (ELECTRODE) ×3 IMPLANT
CANISTER SUCTION 2500CC (MISCELLANEOUS) ×6 IMPLANT
CHLORAPREP W/TINT 26ML (MISCELLANEOUS) ×9 IMPLANT
CLIP APPLIE ROT 13.4 12 LRG (CLIP) IMPLANT
COVER PROBE U/S 5X48 (MISCELLANEOUS) ×3 IMPLANT
DEVICE SUTURE ENDOST 10MM (ENDOMECHANICALS) ×3 IMPLANT
DISSECTOR BLUNT TIP ENDO 5MM (MISCELLANEOUS) IMPLANT
DRAIN CHANNEL 19F RND (DRAIN) ×3 IMPLANT
DRAPE CAMERA CLOSED 9X96 (DRAPES) ×3 IMPLANT
DRSG TEGADERM 4X4.75 (GAUZE/BANDAGES/DRESSINGS) ×3 IMPLANT
EVACUATOR SILICONE 100CC (DRAIN) ×3 IMPLANT
GAUZE SPONGE 4X4 16PLY XRAY LF (GAUZE/BANDAGES/DRESSINGS) IMPLANT
GLOVE SURG SS PI 7.5 STRL IVOR (GLOVE) ×6 IMPLANT
GOWN STRL REIN XL XLG (GOWN DISPOSABLE) ×12 IMPLANT
HOVERMATT SINGLE USE (MISCELLANEOUS) ×3 IMPLANT
KIT BASIN OR (CUSTOM PROCEDURE TRAY) ×3 IMPLANT
KIT GASTRIC LAVAGE 34FR ADT (SET/KITS/TRAYS/PACK) IMPLANT
MARKER SKIN DUAL TIP RULER LAB (MISCELLANEOUS) ×3 IMPLANT
NEEDLE SPNL 22GX3.5 QUINCKE BK (NEEDLE) ×3 IMPLANT
NS IRRIG 1000ML POUR BTL (IV SOLUTION) ×3 IMPLANT
PACK CARDIOVASCULAR III (CUSTOM PROCEDURE TRAY) ×3 IMPLANT
PENCIL BUTTON HOLSTER BLD 10FT (ELECTRODE) ×3 IMPLANT
POUCH SPECIMEN RETRIEVAL 10MM (ENDOMECHANICALS) ×3 IMPLANT
RELOAD BLUE (STAPLE) ×3 IMPLANT
RELOAD ENDO STITCH (ENDOMECHANICALS) ×18 IMPLANT
RELOAD ENDO STITCH 2.0 (ENDOMECHANICALS)
RELOAD GOLD (STAPLE) ×12 IMPLANT
RELOAD WHITE ECR60W (STAPLE) ×12 IMPLANT
SCISSORS LAP 5X45 EPIX DISP (ENDOMECHANICALS) ×3 IMPLANT
SEALANT SURGICAL APPL DUAL CAN (MISCELLANEOUS) IMPLANT
SET IRRIG TUBING LAPAROSCOPIC (IRRIGATION / IRRIGATOR) ×3 IMPLANT
SHEARS CURVED HARMONIC AC 45CM (MISCELLANEOUS) ×3 IMPLANT
SOLUTION ANTI FOG 6CC (MISCELLANEOUS) ×3 IMPLANT
SPONGE GAUZE 4X4 12PLY (GAUZE/BANDAGES/DRESSINGS) IMPLANT
SPONGE LAP 18X18 X RAY DECT (DISPOSABLE) ×3 IMPLANT
STAPLE ECHEON FLEX 60 POW ENDO (STAPLE) ×3 IMPLANT
STAPLER CIRC 21 3.5 SULU (STAPLE) ×3 IMPLANT
STAPLER TRANS-ORAL 21MM DST (STAPLE) ×3 IMPLANT
STAPLER VISISTAT 35W (STAPLE) ×3 IMPLANT
STRIP PERI DRY ECHOLON STPL (ENDOMECHANICALS) ×12 IMPLANT
STRIP PERI DRY VERITAS 60 (STAPLE) ×3 IMPLANT
SUT ETHILON 3 0 PS 1 (SUTURE) ×6 IMPLANT
SUT MNCRL AB 4-0 PS2 18 (SUTURE) ×6 IMPLANT
SUT RELOAD ENDO STITCH 2 48X1 (ENDOMECHANICALS)
SUT RELOAD ENDO STITCH 2.0 (ENDOMECHANICALS)
SUT VICRYL 0 UR6 27IN ABS (SUTURE) ×6 IMPLANT
SUTURE RELOAD END STTCH 2 48X1 (ENDOMECHANICALS) IMPLANT
SUTURE RELOAD ENDO STITCH 2.0 (ENDOMECHANICALS) IMPLANT
SYR 20CC LL (SYRINGE) ×6 IMPLANT
SYR 50ML LL SCALE MARK (SYRINGE) ×3 IMPLANT
TOWEL OR 17X26 10 PK STRL BLUE (TOWEL DISPOSABLE) ×3 IMPLANT
TRAY FOLEY CATH 14FRSI W/METER (CATHETERS) ×3 IMPLANT
TROCAR BLADELESS 15MM (ENDOMECHANICALS) ×3 IMPLANT
TROCAR BLADELESS OPT 5 100 (ENDOMECHANICALS) ×6 IMPLANT
TROCAR XCEL NON-BLD 5MMX100MML (ENDOMECHANICALS) ×3 IMPLANT
TUBING CONNECTING 10 (TUBING) ×3 IMPLANT
TUBING ENDO SMARTCAP (MISCELLANEOUS) IMPLANT
TUBING ENDO SMARTCAP PENTAX (MISCELLANEOUS) ×3 IMPLANT
TUBING FILTER THERMOFLATOR (ELECTROSURGICAL) ×3 IMPLANT

## 2012-12-29 NOTE — Brief Op Note (Signed)
12/29/2012  10:56 AM  PATIENT:  Kara [name]  37 y.o. female  PRE-OPERATIVE DIAGNOSIS:  morbid obesity   POST-OPERATIVE DIAGNOSIS:  morbid obesity   PROCEDURE:  Procedure(s): LAPAROSCOPIC ROUX-EN-Y GASTRIC BYPASS WITH UPPER ENDOSCOPY UPPER GI ENDOSCOPY  SURGEON:  Surgeon(s) and Role:    * Lodema Pilot, DO - Primary    * Kandis Cocking, MD - Assisting  PHYSICIAN ASSISTANT:   ASSISTANTS: Newman   ANESTHESIA:   general  EBL:  Total I/O In: 2200 [I.V.:2200] Out: 150 [Urine:100; Blood:50]  BLOOD ADMINISTERED:none  DRAINS: (42F) Jackson-Pratt drain(s) with closed bulb suction in the GJ anastamosis   LOCAL MEDICATIONS USED:  MARCAINE    and LIDOCAINE   SPECIMEN:  No Specimen  DISPOSITION OF SPECIMEN:  N/A  COUNTS:  YES  TOURNIQUET:  * No tourniquets in log *  DICTATION: .Other Dictation: Dictation Number [id number]  PLAN OF CARE: Admit to inpatient   PATIENT DISPOSITION:  PACU - hemodynamically stable.   Delay start of Pharmacological VTE agent (>24hrs) due to surgical blood loss or risk of bleeding: no

## 2012-12-29 NOTE — Progress Notes (Signed)
Name:  Hayzel Ruberg MRN: [id number] Date of Surgery: 12/29/2012  Preop Diagnosis:  Morbid Obesity, S/P RYGB  Postop Diagnosis:  Morbid Obesity, S/P RYGB  Procedure:  Upper endoscopy  (Intraoperative)  Surgeon:  Ovidio Kin, M.D.  Anesthesia:  GET  Indications for procedure: Kara [name] is a 37 y.o. female whose primary care physician is Judie Petit, MD and has completed a Roux-en-Y gastric bypass today by Dr. Biagio Quint.  I am doing an intraoperative upper endoscopy to evaluate the gastric pouch and the gastro-jejunal anastomosis.  Operative Note: The patient is under general anesthesia.  Dr. Biagio Quint is laparoscoping the patient while I do an upper endoscopy to evaluate the stomach pouch and gastrojejunal anastomosis.  With the patient intubated, I passed the Pentax endoscope without difficulty down the esophagus.  The esophago-gastric junction was at 38 cm.  The gastro-jejunal anastomosis was at 44 cm.  The mucosa of the stomach looked viable and the staple line was intact without bleeding.  The gastro-jejunal anastomosis looked okay.  While I insufflated the stomach pouch with air, Dr. Biagio Quint clamped off the efferent limb of the jejunum.  He then flooded the upper abdomen with saline to put the gastric pouch and gastro-jejunal anastomosis under saline.  There was no bubbling or evidence of a leak.  Photos were taken of the anastomosis.  The scope was then withdrawn.  The esophagus was unremarkable and the patient tolerated the endoscopy without difficulty.  Ovidio Kin, MD, [facility] Pager: [phone] Office phone:  [phone]

## 2012-12-29 NOTE — Anesthesia Preprocedure Evaluation (Signed)
Anesthesia Evaluation  Patient identified by MRN, date of birth, ID band Patient awake    Reviewed: Allergy & Precautions, H&P , NPO status , Patient's Chart, lab work & pertinent test results  History of Anesthesia Complications (+) PONV and history of anesthetic complications  Airway Mallampati: II TM Distance: >3 FB Neck ROM: Full    Dental no notable dental hx. (+) Dental Advisory Given   Pulmonary pneumonia -, resolved, former smoker,  breath sounds clear to auscultation  Pulmonary exam normal       Cardiovascular negative cardio ROS  Rhythm:Regular Rate:Normal     Neuro/Psych PSYCHIATRIC DISORDERS Anxiety Depression negative neurological ROS     GI/Hepatic Neg liver ROS, GERD-  ,  Endo/Other  Hypothyroidism Morbid obesity  Renal/GU negative Renal ROS     Musculoskeletal negative musculoskeletal ROS (+)   Abdominal   Peds  Hematology negative hematology ROS (+)   Anesthesia Other Findings   Reproductive/Obstetrics Negative pregnancy test 04/30/12                           Anesthesia Physical  Anesthesia Plan  ASA: III  Anesthesia Plan: General   Post-op Pain Management:    Induction: Intravenous  Airway Management Planned: Oral ETT  Additional Equipment:   Intra-op Plan:   Post-operative Plan: Extubation in OR  Informed Consent: I have reviewed the patients History and Physical, chart, labs and discussed the procedure including the risks, benefits and alternatives for the proposed anesthesia with the patient or authorized representative who has indicated his/her understanding and acceptance.   Dental advisory given  Plan Discussed with: CRNA  Anesthesia Plan Comments:         Anesthesia Quick Evaluation

## 2012-12-29 NOTE — H&P (View-Only) (Signed)
Patient ID: Kara [name], female   DOB: 03/11/1975, 37 y.o.   MRN: [id number]  Chief Complaint  Patient presents with  . Bariatric Pre-op    HPI Kara [name] is a 37 y.o. female.   HPIthis patient presents for her preoperative surgery evaluation and preparation for Roux-en-Y gastric bypass. She has a BMI of 57 with obesity with a commodities of hyperlipidemia, arthritis, and hypothyroidism. She has completed her psychological nutrition evaluation. Her thyroid labs were abnormal initially these have since been corrected. She has not started her preoperative diet but is scheduled to start this next week.  Past Medical History  Diagnosis Date  . ANXIETY 08/18/2006  . 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 DEPRESSION 02/22/2010  . HYPERLIPIDEMIA 06/11/2007  . HYPOTHYROIDISM 08/18/2006  . OBESITY 06/16/2008  . GERD (gastroesophageal reflux disease)   . Pneumonia     hx of     Past Surgical History  Procedure Laterality Date  . Orif ulnar fracture    . Cholecystectomy N/A 05/08/2012    Procedure: LAPAROSCOPIC CHOLECYSTECTOMY WITH INTRAOPERATIVE CHOLANGIOGRAM;  Surgeon: Alicia Thomas, MD;  Location: [facility];  Service: General;  Laterality: N/A;  . Knee arthroscopy Left 07/28/12    dr. duda    Family History  Problem Relation Age of Onset  . Schizophrenia Mother   . Cancer Mother     lung  . Heart disease Father     Social History History  Substance Use Topics  . Smoking status: Former Smoker    Types: Cigarettes    Quit date: 01/28/2005  . Smokeless tobacco: Never Used  . Alcohol Use: Yes     Comment: occ glass of wine or mixed drink     Allergies  Allergen Reactions  . Asa [Aspirin] Itching and Swelling    Current Outpatient Prescriptions  Medication Sig Dispense Refill  . atorvastatin (LIPITOR) 10 MG tablet Take 1 tablet (10 mg total) by mouth daily.  90 tablet  3  . cetirizine (ZYRTEC) 10 MG tablet Take 10 mg by mouth daily.      . diclofenac (VOLTAREN)  50 MG EC tablet Take 1 tablet (50 mg total) by mouth 2 (two) times daily.  60 tablet  3  . levothyroxine (SYNTHROID, LEVOTHROID) 125 MCG tablet Take 2 tablets (250 mcg total) by mouth daily.  180 tablet  3  . medroxyPROGESTERone (DEPO-PROVERA) 150 MG/ML injection Inject 150 mg into the muscle every 3 (three) months. Last dose was 04/23/12      . Multiple Vitamins-Minerals (ADULT ONE DAILY GUMMIES) CHEW Chew 2 tablets by mouth daily.       . traMADol (ULTRAM) 50 MG tablet TAKE ONE-HALF TO ONE TABLET BY MOUTH TWICE DAILY AS NEEDED FOR PAIN  60 tablet  3  . venlafaxine XR (EFFEXOR-XR) 75 MG 24 hr capsule TAKE ONE CAPSULE BY MOUTH ONCE DAILY.  30 capsule  5   No current facility-administered medications for this visit.    Review of Systems Review of Systems All other review of systems negative or noncontributory except as stated in the HPI  Blood pressure 140/100, pulse 92, temperature 97.9 F (36.6 C), temperature source Temporal, resp. rate 16, height 5' 7" (1.702 m), weight 365 lb (165.563 kg).  Physical Exam Physical Exam Physical Exam  Nursing note and vitals reviewed. Constitutional: She is oriented to person, place, and time. She appears well-developed and well-nourished. No distress.  HENT:  Head: Normocephalic and atraumatic.  Mouth/Throat: No oropharyngeal exudate.  Eyes: Conjunctivae and EOM   are normal. Pupils are equal, round, and reactive to light. Right eye exhibits no discharge. Left eye exhibits no discharge. No scleral icterus.  Neck: Normal range of motion. Neck supple. No tracheal deviation present.  Cardiovascular: Normal rate, regular rhythm, normal heart sounds and intact distal pulses.   Pulmonary/Chest: Effort normal and breath sounds normal. No stridor. No respiratory distress. She has no wheezes.  Abdominal: Soft. Bowel sounds are normal. She exhibits no distension and no mass. There is no tenderness. There is no rebound and no guarding.  Musculoskeletal: Normal  range of motion. She exhibits no edema and no tenderness.  Neurological: She is alert and oriented to person, place, and time.  Skin: Skin is warm and dry. No rash noted. She is not diaphoretic. No erythema. No pallor.  Psychiatric: She has a normal mood and affect. Her behavior is normal. Judgment and thought content normal.    Data Reviewed   Assessment    Obesity with BMI of 57 and obesity related comorbidities of hyperlipidemia and arthritis and hypothyroidism We can discuss the options for medical and surgical weight loss including the LAP-BAND, sleeve gastrectomy, and Roux-en-Y gastric bypass she remained intubated and Roux-en-Y gastric bypass. She has completed her preoperative workup and remains motivated interested in Roux-en-Y gastric bypass. We discussed the pros and cons of the procedure as well as the risks and benefits. The risks of infection, bleeding, pain, scarring, weight regain, too little or too much weight loss, vitamin deficiencies and need for lifelong vitamin supplementation, hair loss, need for protein supplementation, leaks, stricture, reflux, food intolerance, need for reoperation , need for open surgery, injury to spleen or surrounding structures, DVT's, PE, and death again discussed with the patient and the patient expressed understanding and desires to proceed with laparoscopic RYGB, possible open, intraoperative endoscopy.  We also discussed the perioperative course and expectations.      Plan    We will plan for Roux-en-Y gastric bypass. I did explain that I will be leaving the week after surgery and she will need to followup from my partners if there is any complication and for her routine followup        Terence Googe DAVID 12/11/2012, 11:19 AM    

## 2012-12-29 NOTE — Interval H&P Note (Signed)
History and Physical Interval Note:  12/29/2012 7:11 AM  Kara [name]  has presented today for surgery, with the diagnosis of morbid obesity   The various methods of treatment have been discussed with the patient and family. After consideration of risks, benefits and other options for treatment, the patient has consented to  Procedure(s): LAPAROSCOPIC ROUX-EN-Y GASTRIC BYPASS WITH UPPER ENDOSCOPY (N/A) as a surgical intervention .  The patient's history has been reviewed, patient examined, no change in status, stable for surgery.  I have reviewed the patient's chart and labs.  Questions were answered to the patient's satisfaction.  I have seen and evaluated the patient in the preop are.  Risks of the procedure again discussed with the patient.  The risks of infection, bleeding, pain, scarring, weight regain, too little or too much weight loss, vitamin deficiencies and need for lifelong vitamin supplementation, hair loss, need for protein supplementation, leaks, stricture, dumping syndrome, internal hernias, food intolerance, need for reoperation, need for open surgery, injury to spleen or surrounding structures, DVT's, PE, and death again discussed with the patient and the patient expressed understanding and desires to proceed with laparoscopic roux en Y gastric bypass, possible open, intraoperative endoscopy.      Lodema Pilot DAVID

## 2012-12-29 NOTE — Anesthesia Postprocedure Evaluation (Signed)
Anesthesia Post Note  Patient: Kara [name]  Procedure(s) Performed: Procedure(s): LAPAROSCOPIC ROUX-EN-Y GASTRIC BYPASS WITH UPPER ENDOSCOPY UPPER GI ENDOSCOPY  Anesthesia type: General  Patient location: PACU  Post pain: Pain level controlled  Post assessment: Post-op Vital signs reviewed  Last Vitals: BP 149/92  Pulse 86  Temp(Src) 36.7 C (Oral)  Resp 24  Ht 5\' 7"  (1.702 m)  Wt 346 lb 4 oz (157.058 kg)  BMI 54.22 kg/m2  SpO2 96%  Post vital signs: Reviewed  Level of consciousness: sedated  Complications: No apparent anesthesia complications

## 2012-12-29 NOTE — Transfer of Care (Signed)
Immediate Anesthesia Transfer of Care Note  Patient: Kara [name]  Procedure(s) Performed: Procedure(s): LAPAROSCOPIC ROUX-EN-Y GASTRIC BYPASS WITH UPPER ENDOSCOPY UPPER GI ENDOSCOPY  Patient Location: PACU  Anesthesia Type:General  Level of Consciousness: awake, alert  and oriented  Airway & Oxygen Therapy: Patient Spontanous Breathing and Patient connected to face mask oxygen  Post-op Assessment: Report given to PACU RN and Post -op Vital signs reviewed and stable  Post vital signs: Reviewed and stable  Complications: No apparent anesthesia complications

## 2012-12-30 ENCOUNTER — Encounter (HOSPITAL_COMMUNITY): Payer: Self-pay | Admitting: General Surgery

## 2012-12-30 LAB — COMPREHENSIVE METABOLIC PANEL
ALT: 81 U/L — ABNORMAL HIGH (ref 0–35)
Alkaline Phosphatase: 85 U/L (ref 39–117)
BUN: 8 mg/dL (ref 6–23)
CO2: 20 mEq/L (ref 19–32)
Chloride: 100 mEq/L (ref 96–112)
Creatinine, Ser: 0.57 mg/dL (ref 0.50–1.10)
GFR calc non Af Amer: >90 mL/min (ref >90)
Glucose, Bld: 136 mg/dL — ABNORMAL HIGH (ref 70–99)
Potassium: 3.8 mEq/L (ref 3.5–5.1)
Total Bilirubin: 0.6 mg/dL (ref 0.3–1.2)
Total Protein: 7.5 g/dL (ref 6.0–8.3)

## 2012-12-30 LAB — CBC WITH DIFFERENTIAL/PLATELET
HCT: 41.6 % (ref 36.0–46.0)
Hemoglobin: 14.5 g/dL (ref 12.0–15.0)
Lymphocytes Relative: 6 % — ABNORMAL LOW (ref 12–46)
Lymphs Abs: 1.2 10*3/uL (ref 0.7–4.0)
MCHC: 34.9 g/dL (ref 30.0–36.0)
MCV: 84.6 fL (ref 78.0–100.0)
Monocytes Absolute: 1.7 10*3/uL — ABNORMAL HIGH (ref 0.1–1.0)
Monocytes Relative: 9 % (ref 3–12)
Neutro Abs: 16.2 10*3/uL — ABNORMAL HIGH (ref 1.7–7.7)
RBC: 4.92 MIL/uL (ref 3.87–5.11)
WBC: 19.1 10*3/uL — ABNORMAL HIGH (ref 4.0–10.5)

## 2012-12-30 NOTE — Care Management Note (Signed)
    Page 1 of 1   12/30/2012     11:17:53 AM   CARE MANAGEMENT NOTE 12/30/2012  Patient:  Kara [name], Kara [name]   Account Number:  [id number]  Date Initiated:  12/30/2012  Documentation initiated by:  Lorenda Ishihara  Subjective/Objective Assessment:   37 yo female admitted s/p gastric bypass. PTA lived at home with spouse.     Action/Plan:   Home when stable   Anticipated DC Date:  01/02/2013   Anticipated DC Plan:  HOME/SELF CARE      DC Planning Services  CM consult      Choice offered to / List presented to:             Status of service:  Completed, signed off Medicare Important Message given?   (If response is "NO", the following Medicare IM given date fields will be blank) Date Medicare IM given:   Date Additional Medicare IM given:    Discharge Disposition:  HOME/SELF CARE  Per UR Regulation:  Reviewed for med. necessity/level of care/duration of stay  If discussed at Long Length of Stay Meetings, dates discussed:    Comments:

## 2012-12-30 NOTE — Progress Notes (Signed)
1 Day Post-Op  Subjective: Has required zofran last night.  Feels okay this am.  Pain controlled  Objective: Vital signs in last 24 hours: Temp:  [97.4 F (36.3 C)-98.9 F (37.2 C)] 98 F (36.7 C) (12/03 0555) Pulse Rate:  [86-103] 103 (12/03 0555) Resp:  [17-24] 20 (12/03 0555) BP: (130-155)/(84-92) 131/84 mmHg (12/03 0555) SpO2:  [92 %-100 %] 99 % (12/03 0555) Last BM Date: 12/28/12  Intake/Output from previous day: 12/02 0701 - 12/03 0700 In: 4320.8 [I.V.:4320.8] Out: 3045 [Urine:2550; Drains:445; Blood:50] Intake/Output this shift:    General appearance: alert, cooperative and no distress Resp: clear to auscultation bilaterally Cardio: regular, HR 77 when sleeping, 105 after awakening the patient GI: soft, appropriate incisional tenderness, ND, wounds okay, JP ss, no peritoneal signs Extremities: SCD's bilat  Lab Results:   Recent Labs  12/30/12 0550  WBC 19.1*  HGB 14.5  HCT 41.6  PLT 311   BMET  Recent Labs  12/30/12 0550  NA 134*  K 3.8  CL 100  CO2 20  GLUCOSE 136*  BUN 8  CREATININE 0.57  CALCIUM 9.4   PT/INR No results found for this basename: LABPROT, INR,  in the last 72 hours ABG No results found for this basename: PHART, PCO2, PO2, HCO3,  in the last 72 hours  Studies/Results: No results found.  Anti-infectives: Anti-infectives   Start     Dose/Rate Route Frequency Ordered Stop   12/29/12 0607  ertapenem [facility]) 1 g in sodium chloride 0.9 % 50 mL IVPB     1 g 100 mL/hr over 30 Minutes Intravenous On call to O.R. 12/29/12 4782 12/29/12 0754      Assessment/Plan: s/p Procedure(s): LAPAROSCOPIC ROUX-EN-Y GASTRIC BYPASS WITH UPPER ENDOSCOPY UPPER GI ENDOSCOPY doing well. Bariatric clears as tolerated. Mobilize    LOS: 1 day    Kara [name] 12/30/2012

## 2012-12-30 NOTE — Plan of Care (Signed)
Problem: Phase I Progression Outcomes Goal: Hemodynamically stable Outcome: Progressing VS stable.   Problem: Phase II Progression Outcomes Goal: Pain controlled on oral analgesia Outcome: Progressing Pain currently controlled with oral medications. Goal: Activity at appropriate level-compared to baseline (UP IN CHAIR FOR HEMODIALYSIS)  Outcome: Progressing Patient ambulating in halsl and sitting up in chair for most of the day. Minimal time spent in bed.  Goal: Surgical site without signs of infection Outcome: Progressing Surgical site without redness, warmth, or drainage. Goal: Tolerating diet advance to Outcome: Progressing Patient tolerating bariatric clears.

## 2012-12-30 NOTE — Op Note (Signed)
Kara, [name] NO.:  [id number]  MEDICAL RECORD NO.:  [id number]  LOCATION:  1535                         FACILITY:  [facility]  PHYSICIAN:  Lodema Pilot, MD       DATE OF BIRTH:  1975-08-06  DATE OF PROCEDURE:  12/29/2012 DATE OF DISCHARGE:                              OPERATIVE REPORT   PROCEDURE:  Laparoscopic Roux-en-Y gastric bypass with intraoperative upper endoscopy.  PREOPERATIVE DIAGNOSIS:  Morbid obesity.  POSTOPERATIVE DIAGNOSIS:  Morbid obesity.  SURGEON:  Lodema Pilot, MD  ASSISTANT:  Dr. Ezzard Standing.  ANESTHESIA:  General endotracheal anesthesia with 55 mL of 1% lidocaine with epinephrine and 0.25% Marcaine in a 50:50 mixture.  FLUIDS:  2200 mL crystalloid.  ESTIMATED BLOOD LOSS:  Minimal.  DRAINS:  A 19-French Blake drain placed posterior to the gastrojejunal anastomosis.  COMPLICATIONS:  None apparent.  FINDINGS:  A 60 mm side-to-side stapled jejunojejunal anastomosis with closure of the mesenteric defect, antecolic, antegastric Roux-en-Y gastric bypass with 21 mm stapled EEA stapler.  Intraoperative leak test was negative.  INDICATION FOR PROCEDURE:  Kara [name] is a 37 year old female with a BMI of 40 who has failed medical weight loss attempts and who desires durable weight loss solution.  OPERATIVE DETAILS:  Kara [name] was seen and evaluated in the preoperative area and risks and benefits of the procedure were again discussed in lay terms.  Informed consent was obtained.  She was given subcu heparin and prophylactic antibiotics and taken to the operating room, placed on the table in supine position.  General endotracheal tube anesthesia was obtained.  Foley catheter was placed and her abdomen was prepped and draped in a standard surgical fashion.  A 5-mm Optiview trocar was placed in the left upper quadrant on the first attempt and pneumoperitoneum was obtained.  Laparoscope was introduced, and there was no evidence of bleeding or  bowel injury upon entry.  A 5 mm left rectus port was placed under direct visualization.  A 15 mm right rectus port and a 5 mm right upper quadrant port were all placed under direct visualization.  She had normal-appearing anatomy, although she did have a very thick and heavy omentum.  I elevated this over the liver and elevated the transverse colon to identify the ligament of Treitz.  The ligament of Treitz was identified and measured out 50 cm from the ligament of Treitz and divided the small bowel transversely after certifying that this would reach up to the diaphragm.  I divided the mesentery down to the base taking care to avoid undermining each limb of the small bowel.  I marked the Roux limb with Surgidac suture for later identification.  I then measured out 100 cm on the Roux limb and 2 small bowel enterotomies were made and a side-to-side stapled anastomosis was created with 60 mm white load stapler.  Then, the common enterotomy was closed with a running 2-0 Surgidac Lembert suture.  The entire enterotomy was closed, and an alignment stitch was placed to align the 2 limbs of the small bowel.  A cross stitch was also placed.  Then, the jejunojejunal mesenteric defect was approximated with a running 2-0 Surgidac suture, and the mesentery  was divided up to the transverse colon.  Liver retractor was passed through a separate stab incision in the epigastrium to retract the left lobe of the liver and all of the tubes were removed from the stomach.  The pars flaccida was opened and a retrogastric window was identified and lesser curve vasculature was divided transversely up to the edge of the stomach and about 5 cm from the GE junction.  Although her upper GI appeared to have a small hiatal hernia, there was not any significant hiatal hernia identified intra- abdominally.  A transverse firing of gold 60 mm stapler was taken transversely across the lesser curvature of the stomach, about 5  cm from the GE junction, and then I created a retrogastric tunnel and connected this at the angle of His.  The lateral wall of my gastric pouch was created with sequential firings of gold 60 mm EEA stapler with Peri- Strips.  The stomach was completely transected, and she had some oozing on the staple line, and this was controlled with small amount of Bovie electrocautery.  With the stomach completely divided and the gastric pouch formed, I made a small gastrotomy on the uncovered portion in the central portion of the distal staple line, and 21 mm EEA anvil was passed through the mouth and exited through the small gastrotomy.  The tubing was removed and thrown away and I changed gloves after this.  I then approximated the stomach around the anvil with 2-0 Surgidac sutures and opened up the candy-cane portion of the Roux limb.  I enlarged my 15- mm port site to accommodate placement of a 21 mm EEA stapler with 3.5 mm staples.  This was passed through the open enterotomy in the Roux limb and the spike was exited on the antimesenteric surface of the small bowel, and this was mated with the anvil and anastomosis was created. The stapler was removed and the donuts appeared to be intact and the excess portion of the candy cane of the Roux limb was undermined at the mesentery and then was amputated and flushed with the gastrojejunal anastomosis, thereby closing the enterotomy.  Antitension stitches were placed on each side of the anastomosis with the Surgidac suture, and the Roux limb was clamped and Dr. Ezzard Standing performed upper endoscopy passing the well-lubricated fiberoptic endoscope through the mouth and into the gastric pouch and through the anastomosis.  It was an approximately 6 cm pouch.  We insufflated the pouch with air and the anastomosis then was submerged under water.  There was no evidence of air leakage.  Air was suctioned and the scope was removed.  There was no evidence of  internal bleeding as well.  The jejunojejunal anastomosis was again inspected. There was no evidence of leakage or bleeding.  I then placed a 19-French Blake drain into the abdomen posterior to the gastrojejunal anastomosis and exited this through the left upper quadrant trocar site.  This was sutured in place with a nylon drain stitch.  The retractor was removed and the abdomen was noted to be hemostatic without any evidence of bleeding or bowel injury.  I closed the fascia in the rectus region with interrupted 0 Vicryl sutures in open fashion.  Sutures were secured and the abdomen was re-insufflated with carbon dioxide gas.  The abdominal wall closure was noted to be adequate without any bleeding or bowel injury.  Final trocars were removed.  The wound was irrigated with sterile saline solution, and the wounds were injected with total of  55 mL of 1% lidocaine with epinephrine and 0.25% Marcaine in a 50:50 mixture.  Skin edges were approximated with 4-0 Monocryl subcuticular suture.  Skin was washed and dried, and Dermabond was applied.  Foley catheter was removed.  The patient tolerated the procedure well without apparent complications.         ______________________________ Lodema Pilot, MD    BL/MEDQ  D:  12/29/2012  T:  12/30/2012  Job:  [id number]

## 2012-12-31 MED ORDER — OXYCODONE HCL 5 MG/5ML PO SOLN: 5.0000 mg | ORAL | Status: DC | PRN

## 2012-12-31 MED ORDER — ONDANSETRON 4 MG PO TBDP: 4.0000 mg | ORAL_TABLET | Freq: Three times a day (TID) | ORAL | Status: DC | PRN

## 2012-12-31 NOTE — Progress Notes (Signed)
Patient alert and oriented, pain is controlled. Patient is tolerating fluids, plan to advance to protein shake today.  Reviewed Gastric Bypass discharge instructions with patient and patient is able to articulate understanding.  Provided information on BELT, Support Groups and [facility] outpatient pharmacy offerings.  All questions answered, will continue to monitor.

## 2012-12-31 NOTE — Progress Notes (Signed)
2 Days Post-Op  Subjective: Feeling better each day.  No nausea. Tolerating clears  Objective: Vital signs in last 24 hours: Temp:  [97.9 F (36.6 C)-98.6 F (37 C)] 98 F (36.7 C) (12/04 0531) Pulse Rate:  [90-101] 94 (12/04 0531) Resp:  [18] 18 (12/04 0531) BP: (114-156)/(75-92) 135/89 mmHg (12/04 0531) SpO2:  [95 %-100 %] 100 % (12/04 0531) Last BM Date: 12/28/12  Intake/Output from previous day: 12/03 0701 - 12/04 0700 In: 3177 [P.O.:177; I.V.:3000] Out: 2292 [Urine:2200; Drains:92] Intake/Output this shift:    General appearance: alert, cooperative and no distress Resp: clear to auscultation bilaterally Cardio: normal rate, regular GI: soft, appropriate incisional tenderness, ND, wounds with some slight bruising but no sign of erythema, JP ss, no peritoneal signs Extremities: SCD's bilat  Lab Results:   Recent Labs  12/30/12 0550  WBC 19.1*  HGB 14.5  HCT 41.6  PLT 311   BMET  Recent Labs  12/30/12 0550  NA 134*  K 3.8  CL 100  CO2 20  GLUCOSE 136*  BUN 8  CREATININE 0.57  CALCIUM 9.4   PT/INR No results found for this basename: LABPROT, INR,  in the last 72 hours ABG No results found for this basename: PHART, PCO2, PO2, HCO3,  in the last 72 hours  Studies/Results: No results found.  Anti-infectives: Anti-infectives   Start     Dose/Rate Route Frequency Ordered Stop   12/29/12 0607  ertapenem [facility]) 1 g in sodium chloride 0.9 % 50 mL IVPB     1 g 100 mL/hr over 30 Minutes Intravenous On call to O.R. 12/29/12 1610 12/29/12 0754      Assessment/Plan: s/p Procedure(s): LAPAROSCOPIC ROUX-EN-Y GASTRIC BYPASS WITH UPPER ENDOSCOPY UPPER GI ENDOSCOPY she is tolerating liquids.  should be okay for discharge to home later today.  instructions reviewed  LOS: 2 days    Lodema Pilot DAVID 12/31/2012

## 2012-12-31 NOTE — Discharge Summary (Signed)
Physician Discharge Summary  Patient ID: Kara [name] MRN: [id number] DOB/AGE: May 30, 1975 37 y.o.  Admit date: 12/29/2012 Discharge date: 12/31/2012  Admission Diagnoses:obesity  Discharge Diagnoses: same Active Problems:   Obesity   Discharged Condition: stable  Hospital Course: to OR 12/29/12 for lap RYGB.  No apparent complications.  Diet advanced on POD 1 and she tolerated this well.  She was tolerating diet and pain controlled and stable for discharge to home on POD 2  Consults: none  Significant Diagnostic Studies: none  Treatments: surgery: 12/29/12 lap RYGB   Disposition: 01-Home or Self Care  Discharge Orders   Future Appointments Provider Department Dept Phone   01/12/2013 3:30 PM [facility] Post-Op Class [facility] [phone]   01/14/2013 8:00 AM [facility] Lab [facility] [place] [phone]   01/14/2013 12:10 PM Valarie Merino, MD [facility] [place] [phone]   Future Orders Complete By Expires   Call MD for:  difficulty breathing, headache or visual disturbances  As directed    Call MD for:  extreme fatigue  As directed    Call MD for:  persistant dizziness or light-headedness  As directed    Call MD for:  persistant nausea and vomiting  As directed    Call MD for:  redness, tenderness, or signs of infection (pain, swelling, redness, odor or green/yellow discharge around incision site)  As directed    Call MD for:  severe uncontrolled pain  As directed    Call MD for:  temperature >100.4  As directed    Discharge instructions  As directed    Comments:     May shower tomorrow.   Call [phone] for follow up appointment with Biagio Quint in 3 weeks. May start vitamins and protein supplements. May start full liquid diet tomorrow x1 week, then pureed diet x1 week, then soft diet x1 week, then advance to high protein, low fat, low carb diet  Increase activity as tolerated. Drink plenty of fluids. Crush  all meds or take liquid meds x4 weeks.   Increase activity slowly  As directed        Medication List    STOP taking these medications       diclofenac 50 MG EC tablet  Commonly known as:  VOLTAREN     traMADol 50 MG tablet  Commonly known as:  ULTRAM      TAKE these medications       atorvastatin 10 MG tablet  Commonly known as:  LIPITOR  Take 10 mg by mouth daily.     CALCIUM PO  Take 1 tablet by mouth daily.     FLINSTONES GUMMIES OMEGA-3 DHA Chew  Chew 1 each by mouth daily.     levothyroxine 125 MCG tablet  Commonly known as:  SYNTHROID, LEVOTHROID  Take 250 mcg by mouth daily before breakfast.     medroxyPROGESTERone 150 MG/ML injection  Commonly known as:  DEPO-PROVERA  Inject 150 mg into the muscle every 3 (three) months. Last dose was 12/10/12     ondansetron 4 MG disintegrating tablet  Commonly known as:  ZOFRAN ODT  Take 1 tablet (4 mg total) by mouth every 8 (eight) hours as needed for nausea or vomiting.     oxyCODONE 5 MG/5ML solution  Commonly known as:  ROXICODONE  Take 5-10 mLs (5-10 mg total) by mouth every 4 (four) hours as needed for severe pain.     venlafaxine XR 75 MG 24 hr capsule  Commonly known  as:  EFFEXOR-XR  Take 75 mg by mouth daily with breakfast.         Signed: Lodema Pilot DAVID 12/31/2012, 8:57 AM

## 2013-01-12 ENCOUNTER — Encounter: Payer: 59 | Attending: General Surgery

## 2013-01-12 VITALS — Ht 67.0 in | Wt 337.5 lb

## 2013-01-12 DIAGNOSIS — Z713 Dietary counseling and surveillance: Secondary | ICD-10-CM | POA: Insufficient documentation

## 2013-01-12 DIAGNOSIS — E669 Obesity, unspecified: Secondary | ICD-10-CM | POA: Insufficient documentation

## 2013-01-12 NOTE — Patient Instructions (Signed)
Patient to follow Phase 3A-Soft, High Protein Diet and follow-up at [facility] in 6 weeks for 2 months post-op nutrition visit for diet advancement. 

## 2013-01-12 NOTE — Progress Notes (Signed)
Bariatric Class:  Appt start time: 1530 end time:  1630.  2 Week Post-Operative Nutrition Class  Patient was seen on 01/12/13 for Post-[facility]ative [facility].   Surgery date: 12/29/12 Surgery type: RYGB Starting weight at [facility]: 363 lbs on 10/21/12  Weight today: 337.5 lbs Weight change: 25.5 lbs Total weight lost: 25.5 lbs  TANITA  BODY COMP RESULTS  01/12/13   BMI (kg/m^2) 52.9   Fat Mass (lbs) 191.5   Fat Free Mass (lbs) 146.0   Total Body Water (lbs) 107.0   The following the learning objectives were met by the patient during this course:  Identifies Phase 3A (Soft, High Proteins) Dietary Goals and will begin from 2 weeks post-operatively to 2 months post-operatively  Identifies appropriate sources of fluids and proteins   States protein recommendations and appropriate sources post-operatively  Identifies the need for appropriate texture modifications, mastication, and bite sizes when consuming solids  Identifies appropriate multivitamin and calcium sources post-operatively  Describes the need for physical activity post-operatively and will follow MD recommendations  States when to call healthcare provider regarding medication questions or post-operative complications  Handouts given during class include:  Phase 3A: Soft, High Protein Diet Handout  Band Fill Guidelines Handout  Follow-Up Plan: Patient will follow-up at [facility] in 6 weeks for 8 week post-op nutrition visit for diet advancement per MD.

## 2013-01-14 ENCOUNTER — Encounter (INDEPENDENT_AMBULATORY_CARE_PROVIDER_SITE_OTHER): Payer: Self-pay | Admitting: Surgery

## 2013-01-14 ENCOUNTER — Ambulatory Visit (INDEPENDENT_AMBULATORY_CARE_PROVIDER_SITE_OTHER): Payer: 59 | Admitting: Surgery

## 2013-01-14 ENCOUNTER — Telehealth: Payer: Self-pay | Admitting: *Deleted

## 2013-01-14 ENCOUNTER — Other Ambulatory Visit (INDEPENDENT_AMBULATORY_CARE_PROVIDER_SITE_OTHER): Payer: 59

## 2013-01-14 VITALS — BP 130/88 | HR 83 | Temp 98.7°F | Resp 18 | Ht 67.0 in | Wt 333.4 lb

## 2013-01-14 DIAGNOSIS — Z9884 Bariatric surgery status: Secondary | ICD-10-CM | POA: Insufficient documentation

## 2013-01-14 DIAGNOSIS — E785 Hyperlipidemia, unspecified: Secondary | ICD-10-CM

## 2013-01-14 LAB — LIPID PANEL
Cholesterol: 135 mg/dL (ref 0–200)
HDL: 27.6 mg/dL — ABNORMAL LOW (ref >39.00)
LDL Cholesterol: 84 mg/dL (ref 0–99)
Triglycerides: 118 mg/dL (ref 0.0–149.0)
VLDL: 23.6 mg/dL (ref 0.0–40.0)

## 2013-01-14 LAB — HEPATIC FUNCTION PANEL
AST: 18 U/L (ref 0–37)
Albumin: 4 g/dL (ref 3.5–5.2)
Alkaline Phosphatase: 73 U/L (ref 39–117)
Bilirubin, Direct: 0 mg/dL (ref 0.0–0.3)
Total Bilirubin: 0.7 mg/dL (ref 0.3–1.2)
Total Protein: 7.4 g/dL (ref 6.0–8.3)

## 2013-01-14 NOTE — Telephone Encounter (Signed)
Try tylenol.. Probably the best thing for now

## 2013-01-14 NOTE — Progress Notes (Signed)
Kara [name] 37 y.o.  Body mass index is 52.21 kg/(m^2).  Patient Active Problem List   Diagnosis Date Noted  . 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 Obesity 12/29/2012  . DEPRESSION 02/22/2010  . 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 OBESITY 06/16/2008  . HYPERLIPIDEMIA 06/11/2007  . HYPOTHYROIDISM 08/18/2006  . ANXIETY 08/18/2006    Allergies  Allergen Reactions  . Asa [Aspirin] Itching and Swelling    Past Surgical History  Procedure Laterality Date  . Orif ulnar fracture    . Cholecystectomy N/A 05/08/2012    Procedure: LAPAROSCOPIC CHOLECYSTECTOMY WITH INTRAOPERATIVE CHOLANGIOGRAM;  Surgeon: Romie Levee, MD;  Location: [facility];  Service: General;  Laterality: N/A;  . Knee arthroscopy Left 07/28/12    dr. [name]  . Gastric roux-en-y  12/29/2012    Procedure: LAPAROSCOPIC ROUX-EN-Y GASTRIC BYPASS WITH UPPER ENDOSCOPY;  Surgeon: Lodema Pilot, DO;  Location: [facility];  Service: General;;  . Upper gi endoscopy  12/29/2012    Procedure: UPPER GI ENDOSCOPY;  Surgeon: Lodema Pilot, DO;  Location: [facility];  Service: General;;   Judie Petit, MD 1. 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  She has lost 32 pounds. Her right knee is feeling better. She is advancing her diet according to guidance with the dietitian. Her incisions have healed nicely. I encouraged her to start riding the recumbent bicycle. 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 I will get her back to see and he and about 3 months.  Impression doing very well after Roux-en-Y gastric bypass. She can use  Ultram for pain in her joints. Return to see [name] in 3 months [name], MD, [facility] [phone] beeper [phone]  01/14/2013 12:33 PM

## 2013-01-14 NOTE — Telephone Encounter (Signed)
Pt had a gastric bypass on 12/29/12.  She has chronic hip and bilateral knee pain and was told that she could not take the tramadol or diclofenac by surgeon.  The oxycodone makes her feel like a "zombie" so she does not want to take that.  Requesting Dr Cato Mulligan send her a different pain medicine.  [facility].  FYI pt has lost 30lbs since surgery

## 2013-01-14 NOTE — Patient Instructions (Signed)
Gastric Bypass Surgery Care After Refer to this sheet in the next few weeks. These discharge instructions provide you with general information on caring for yourself after you leave the hospital. Your caregiver may also give you specific instructions. Your treatment has been planned according to the most current medical practices available, but unavoidable complications sometimes occur. If you have any problems or questions after discharge, call your caregiver. HOME CARE INSTRUCTIONS  Activity  Take frequent walks throughout the day. This will help to prevent blood clots. Do not sit for longer than 45 minutes to 1 hour while awake for 4 to 6 weeks after surgery.  Continue to do coughing and deep breathing exercises once you get home. This will help to prevent pneumonia.  Do not do strenuous activities, such as heavy lifting, pushing, or pulling, until after your follow-up visit with your caregiver. Do not lift anything heavier than 10 lb (4.5 kg).  Talk with your caregiver about when you may return to work and your exercise routine.  Do not drive while taking prescription pain medicine. Nutrition  It is very important that you drink at least 80 oz (2,400 mL) of fluid a day.  You should stay on a liquid diet until your follow-up visit with your caregiver. Keep sugar-free, liquid items on hand, including:  Tea: hot or cold. Drink only decaffeinated for the first month.  Broths: beef, chicken, vegetable.  Others: water, sugar-free frozen ice pops, flavored water, gelatin (after 1 week).  Do not consume caffeine for 1 month. Large amounts of caffeine can cause dehydration.  A dietician may also give you specific instructions.  Follow your caregiver's recommendations about vitamins and protein requirements after surgery. Hygiene  You may shower and wash your hair 2 days after surgery. Pat incisions dry. Do not rub incisions with a washcloth or towel.  Follow your caregiver's  recommendations about baths and pools following surgery. Pain control  If a prescription medicine was given, follow your caregiver's directions.  You may feel some gas pain caused by the carbon dioxide used to inflate your abdomen during surgery. This pain can be felt in your chest, shoulder, back, or abdominal area. Moving around often is advised. Incision care  You may have 4 or more small incisions. They are closed with skin adhesive strips. Skin adhesive strips can get wet and will fall off on their own. Check your incisions and surrounding area daily for any redness, swelling, discoloration, fluid (drainage), or bleeding. Dark red, dried blood may appear under these coverings. This is normal.  If you have a drain, it will be removed at your follow-up visit or before you leave the hospital.  If your drain is left in, follow your caregiver's instructions on drain care.  If your drain is taken out, keep a clean, dry bandage over the drain site. SEEK MEDICAL CARE IF:   You develop persistent nausea and vomiting.  You have pain and discomfort with swallowing.  You have pain, swelling, or warmth in the lower extremities.  You have an oral temperature above 102 F (38.9 C).  You develop chills.  Your incision sites look red, swollen, or have drainage.  Your stool is black, tarry, or maroon in color.  You are lightheaded when standing.  You notice a bruise getting larger.  You have any questions or concerns. SEEK IMMEDIATE MEDICAL CARE IF:   You have chest pain.  You have severe calf pain or pain not relieved by medicine.  You develop shortness of   breath or difficulty breathing.  There is bright red blood coming from the drain.  You feel confused.  You have slurred speech.  You suddenly feel weak. MAKE SURE YOU:   Understand these instructions.  Will watch your condition.  Will get help right away if you are not doing well or get worse. Document Released:  08/29/2003 Document Revised: 05/11/2012 Document Reviewed: 06/06/2009 ExitCare Patient Information 2014 [employer].  

## 2013-01-15 NOTE — Telephone Encounter (Signed)
Pt aware, surgeon gave pt tramadol back

## 2013-01-18 ENCOUNTER — Encounter (HOSPITAL_COMMUNITY): Payer: Self-pay | Admitting: General Surgery

## 2013-02-03 ENCOUNTER — Ambulatory Visit: Payer: 59 | Admitting: Internal Medicine

## 2013-02-09 ENCOUNTER — Encounter: Payer: Self-pay | Admitting: Internal Medicine

## 2013-02-09 ENCOUNTER — Ambulatory Visit (INDEPENDENT_AMBULATORY_CARE_PROVIDER_SITE_OTHER): Payer: 59 | Admitting: Internal Medicine

## 2013-02-09 VITALS — BP 124/80 | HR 76 | Temp 98.4°F | Ht 67.0 in | Wt 319.0 lb

## 2013-02-09 DIAGNOSIS — E785 Hyperlipidemia, unspecified: Secondary | ICD-10-CM

## 2013-02-09 MED ORDER — VENLAFAXINE HCL 37.5 MG PO TABS: 37.5000 mg | ORAL_TABLET | Freq: Two times a day (BID) | ORAL | Status: DC

## 2013-02-09 MED ORDER — VENLAFAXINE HCL ER 37.5 MG PO CP24: 37.5000 mg | ORAL_CAPSULE | Freq: Every day | ORAL | Status: DC

## 2013-02-09 MED ORDER — TRAMADOL HCL 50 MG PO TABS: 50.0000 mg | ORAL_TABLET | Freq: Three times a day (TID) | ORAL | Status: DC | PRN

## 2013-02-09 NOTE — Progress Notes (Signed)
Gastric bypass surgery She does not get hungry and really has to concentrate on what and when she is eating. She continues to lose weight  Lipids- she continues on atorvastatin  Hypothyroid- on meds  Past Medical History  Diagnosis Date  . ANXIETY 08/18/2006  . 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 DEPRESSION 02/22/2010  . HYPERLIPIDEMIA 06/11/2007  . HYPOTHYROIDISM 08/18/2006  . OBESITY 06/16/2008  . GERD (gastroesophageal reflux disease)   . Pneumonia     hx of   . PONV (postoperative nausea and vomiting)     used Scopolamine patch    History   Social History  . Marital Status: Married    Spouse Name: N/A    Number of Children: N/A  . Years of Education: N/A   Occupational History  . Not on file.   Social History Main Topics  . Smoking status: Former Smoker    Types: Cigarettes    Quit date: 01/28/2005  . Smokeless tobacco: Never Used  . Alcohol Use: Yes     Comment: occ glass of wine or mixed drink   . Drug Use: No  . Sexual Activity: Not on file   Other Topics Concern  . Not on file   Social History Narrative  . No narrative on file    Past Surgical History  Procedure Laterality Date  . Orif ulnar fracture    . Cholecystectomy N/A 05/08/2012    Procedure: LAPAROSCOPIC CHOLECYSTECTOMY WITH INTRAOPERATIVE CHOLANGIOGRAM;  Surgeon: Leighton Ruff, MD;  Location: [facility];  Service: General;  Laterality: N/A;  . Knee arthroscopy Left 07/28/12    dr. Sharol Given  . 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 Upper gi endoscopy  12/29/2012    Procedure: UPPER GI ENDOSCOPY;  Surgeon: Madilyn Hook, DO;  Location: [facility];  Service: General;;    Family History  Problem Relation Age of Onset  . Schizophrenia Mother   . Cancer Mother     lung  . Heart disease Father     Allergies  Allergen Reactions  . Asa [Aspirin] Itching and  Swelling    Current Outpatient Prescriptions on File Prior to Visit  Medication Sig Dispense Refill  . CALCIUM PO Take 1 tablet by mouth 3 (three) times daily.       Marland Kitchen levothyroxine (SYNTHROID, LEVOTHROID) 125 MCG tablet Take 250 mcg by mouth daily before breakfast.      . medroxyPROGESTERone (DEPO-PROVERA) 150 MG/ML injection Inject 150 mg into the muscle every 3 (three) months. Last dose was 12/10/12      . ondansetron (ZOFRAN ODT) 4 MG disintegrating tablet Take 1 tablet (4 mg total) by mouth every 8 (eight) hours as needed for nausea or vomiting.  20 tablet  0  . Pediatric Multiple Vit-C-FA (FLINSTONES GUMMIES OMEGA-3 DHA) CHEW Chew 1 each by mouth 2 (two) times daily.       . vitamin B-12 (CYANOCOBALAMIN) 500 MCG tablet Take 500 mcg by mouth daily.       No current facility-administered medications on file prior to visit.     patient denies chest pain, shortness of breath, orthopnea. Denies lower extremity edema, abdominal pain, change in appetite, change in bowel movements. Patient denies rashes, musculoskeletal complaints. No other specific complaints in a complete review of systems.   BP 124/80  Pulse 76  Temp(Src) 98.4 F (36.9 C) (Oral)  Ht 5'  7" (1.702 m)  Wt 319 lb (144.697 kg)  BMI 49.95 kg/m2  Well-developed well-nourished female in no acute distress. HEENT exam atraumatic, normocephalic, extraocular muscles are intact. Neck is supple. No jugular venous distention no thyromegaly. Chest clear to auscultation without increased work of breathing. Cardiac exam S1 and S2 are regular. Abdominal exam active bowel sounds, soft, nontender. Extremities no edema. Neurologic exam she is alert without any motor sensory deficits. Gait is normal.  Asess: Weight- she continues to lose weight Pain management Lipids-   Plan - stop atorvastatin and recheck in 3 months Increase tramadol to tid Change effexor to tabs

## 2013-02-09 NOTE — Progress Notes (Signed)
Pre visit review using our clinic review tool, if applicable. No additional management support is needed unless otherwise documented below in the visit note. 

## 2013-02-25 ENCOUNTER — Encounter: Payer: 59 | Attending: General Surgery | Admitting: Dietician

## 2013-02-25 VITALS — Ht 67.0 in | Wt 313.0 lb

## 2013-02-25 DIAGNOSIS — E669 Obesity, unspecified: Secondary | ICD-10-CM | POA: Insufficient documentation

## 2013-02-25 DIAGNOSIS — Z713 Dietary counseling and surveillance: Secondary | ICD-10-CM | POA: Insufficient documentation

## 2013-02-25 NOTE — Progress Notes (Signed)
  Follow-up visit:  8 Weeks Post-Operative RYGB Surgery  Medical Nutrition Therapy:  Appt start time: 0900 end time:  0930.  Primary concerns today: Post-operative Bariatric Surgery Nutrition Management. Kara [name] returns today with a 24.5 lbs. Has vomited a few times since surgery. States that she may have "developed an intolerance to eggs". Has since tried eggs again and it sat well. Also vomited after having too much chicken. Has tried popcorn which sat ok too. Recommended working on having protein food first before trying carbs.   States that she has no appetite.  States that her pain is a lot better than before surgery and able to get around much easier than before, though still struggles with mobility issues.   Surgery date: 12/29/12 Surgery type: RYGB Starting weight at [facility]: 363 lbs on 10/21/12  Weight today: 313.0 lbs  Weight change: 24.5 lbs Total weight lost: 50 lbs  TANITA  BODY COMP RESULTS  01/12/13 02/25/13   BMI (kg/m^2) 52.9 49.0   Fat Mass (lbs) 191.5 174.5   Fat Free Mass (lbs) 146.0 138.5   Total Body Water (lbs) 107.0 101.5    Preferred Learning Style:   No preference indicated   Learning Readiness:   Ready  24-hr recall: B (AM): Premier protein (30 g) Snk (AM): 1-2 x week will have SF jello or popiscle  L ( PM): LF string cheese and greek yogurt or 1 slice deli meat or grilled chicken  (7-18g)  Snk (PM): none  D (PM): 3 or 4 bites grilled chicken or LF cheese with mini burgers or refried beans or cottage cheese (up to 10 g) Snk ( PM): SF popsicle if didn't have one early   Fluid intake: at least 60 fl oz - water, crystal light or vitamin water Estimated total protein intake: 45-55 g   Medications: see list Supplementation: taking  Using straws: No Drinking while eating: No Hair loss: No Carbonated beverages: No N/V/D/C: some nausea and vomiting if eating eggs or too much  Dumping syndrome: No  Recent physical activity:  Started the BELT  program on Monday, doing some walking at [employer] or outside at home to the mailbox and doing some peddling on a machine at home  Progress Towards Goal(s):  In progress.  Handouts given during visit include:  Phase 3B High Protein + Non Starchy Vegetables   Nutritional Diagnosis:  -3.3 Overweight/obesity related to past poor dietary habits and physical inactivity as evidenced by patient w/ recent RYGB surgery following dietary guidelines for continued weight loss.    Intervention:  Nutrition education/diet advancement.  Teaching Method Utilized:  Visual Auditory Hands on  Barriers to learning/adherence to lifestyle change: mobility issues  Demonstrated degree of understanding via:  Teach Back   Monitoring/Evaluation:  Dietary intake, exercise, lap band fills, and body weight. Follow up in 1 months for 3 month post-op visit.

## 2013-02-25 NOTE — Patient Instructions (Signed)
Goals:  Follow Phase 3B: High Protein + Non-Starchy Vegetables  Eat 3-6 small meals/snacks, every 3-5 hrs  Increase lean protein foods to meet 60g goal  Increase fluid intake to 64oz +  Avoid drinking 15 minutes before, during and 30 minutes after eating  Aim for >30 min of physical activity daily

## 2013-03-25 ENCOUNTER — Ambulatory Visit: Payer: 59 | Admitting: Dietician

## 2013-03-30 ENCOUNTER — Encounter: Payer: 59 | Attending: General Surgery | Admitting: Dietician

## 2013-03-30 VITALS — Ht 67.0 in | Wt 294.0 lb

## 2013-03-30 DIAGNOSIS — Z713 Dietary counseling and surveillance: Secondary | ICD-10-CM | POA: Insufficient documentation

## 2013-03-30 DIAGNOSIS — E669 Obesity, unspecified: Secondary | ICD-10-CM

## 2013-03-30 NOTE — Patient Instructions (Addendum)
Goals:  Follow Phase 3B: High Protein + Non-Starchy Vegetables  Eat 3-6 small meals/snacks, every 3-5 hrs  Increase lean protein foods to meet 60g goal  Increase fluid intake to 64oz +  Avoid drinking 15 minutes before, during and 30 minutes after eating  Aim for >30 min of physical activity daily  Add almonds or dried soybeans for snacks  Consider trying veggie burgers instead of meat for meals  Consider mixing meat or eggs with vegetables

## 2013-03-30 NOTE — Progress Notes (Signed)
  Follow-up visit:  12 Weeks Post-Operative RYGB Surgery  Medical Nutrition Therapy:  Appt start time: 1230 end time:  1000.  Primary concerns today: Post-operative Bariatric Surgery Nutrition Management. Dedee returns today with a 19 lbs weight loss. States that nothing tastes good and does not have an appetite.  No longer having an "intolerance" to eggs but states that smells and tastes "don't match". Struggling with food not tasting good, especially protein.   Going to the BELT program and mobility is continuing to improve.  Surgery date: 12/29/12 Surgery type: RYGB Starting weight at [facility]: 363 lbs on 10/21/12  Weight today: 294.0 lbs  Weight change: 19 lbs Total weight lost: 69 lbs  TANITA  BODY COMP RESULTS  01/12/13 02/25/13 03/30/13   BMI (kg/m^2) 52.9 49.0 46.0   Fat Mass (lbs) 191.5 174.5 164.0   Fat Free Mass (lbs) 146.0 138.5 130.0   Total Body Water (lbs) 107.0 101.5 95.0    Preferred Learning Style:   No preference indicated   Learning Readiness:   Ready  24-hr recall: B (AM): Premier protein (30 g) Snk (AM): none L ( PM):  greek yogurt   (12g)  Snk (PM): none  D (PM): 3 or 4 bites grilled chicken or LF cheese with mini burgers or refried beans  (up to 10 g) Snk ( PM): GNC protein shake and PB2 (29 g) Snk ( PM): SF popsicle  Fluid intake: at least 64 fl oz - water, crystal light or vitamin water + 2 protein shakes Estimated total protein intake: ~80 g   Medications: see list Supplementation: taking  Using straws: No Drinking while eating: No Hair loss: Not any more than usual after surgeries  Carbonated beverages: No N/V/D/C: had stomach bug a few weekends ago where she couldn't keep anything down  Dumping syndrome: No  Recent physical activity:  BELT program, doing housework, walking to her mailbox on a hill, walks laps at stores     Progress Towards Goal(s):  In progress.    Nutritional Diagnosis:  [place]-3.3 Overweight/obesity related to past  poor dietary habits and physical inactivity as evidenced by patient w/ recent RYGB surgery following dietary guidelines for continued weight loss.    Intervention:  Nutrition education/diet advancement.  Teaching Method Utilized:  Visual Auditory Hands on  Barriers to learning/adherence to lifestyle change: mobility issues, foods don't "taste good"  Demonstrated degree of understanding via:  Teach Back   Monitoring/Evaluation:  Dietary intake, exercise, lap band fills, and body weight. Follow up in 3 months for 6 month post-op visit.

## 2013-04-15 ENCOUNTER — Encounter (INDEPENDENT_AMBULATORY_CARE_PROVIDER_SITE_OTHER): Payer: Self-pay | Admitting: Surgery

## 2013-04-15 ENCOUNTER — Ambulatory Visit (INDEPENDENT_AMBULATORY_CARE_PROVIDER_SITE_OTHER): Payer: 59 | Admitting: Surgery

## 2013-04-15 VITALS — BP 128/82 | HR 71 | Temp 98.4°F | Resp 18 | Ht 67.0 in | Wt 284.2 lb

## 2013-04-15 DIAGNOSIS — Z9884 Bariatric surgery status: Secondary | ICD-10-CM

## 2013-04-15 NOTE — Progress Notes (Signed)
Kara [name] 38 y.o.  Body mass index is 44.5 kg/(m^2).  Patient Active Problem List   Diagnosis Date Noted  . 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 DEPRESSION 02/22/2010  . 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 OBESITY 06/16/2008  . HYPERLIPIDEMIA 06/11/2007  . HYPOTHYROIDISM 08/18/2006  . ANXIETY 08/18/2006    Allergies  Allergen Reactions  . Asa [Aspirin] Itching and Swelling    Past Surgical History  Procedure Laterality Date  . Orif ulnar fracture    . Cholecystectomy N/A 05/08/2012    Procedure: LAPAROSCOPIC CHOLECYSTECTOMY WITH INTRAOPERATIVE CHOLANGIOGRAM;  Surgeon: Leighton Ruff, MD;  Location: [facility];  Service: General;  Laterality: N/A;  . Knee arthroscopy Left 07/28/12    dr. Sharol Given  . 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 Upper gi endoscopy  12/29/2012    Procedure: UPPER GI ENDOSCOPY;  Surgeon: Madilyn Hook, DO;  Location: [facility];  Service: General;;   [name], MD No diagnosis found.  80 lb weight loss.  Doing great in BELT program.  Some nausea and intolerance of meats.  Advised to try stringy pork barbecue and slaw.   Will see back in 3 months.  She is taking her vitamins.  Matt B. Hassell Done, MD, [facility] [phone] beeper [phone]  04/15/2013 10:24 AM

## 2013-04-19 ENCOUNTER — Encounter: Payer: Self-pay | Admitting: Internal Medicine

## 2013-04-19 ENCOUNTER — Ambulatory Visit (INDEPENDENT_AMBULATORY_CARE_PROVIDER_SITE_OTHER): Payer: 59 | Admitting: Internal Medicine

## 2013-04-19 VITALS — BP 120/80 | HR 80 | Temp 98.8°F | Ht 67.0 in | Wt 284.0 lb

## 2013-04-19 DIAGNOSIS — H8110 Benign paroxysmal vertigo, unspecified ear: Secondary | ICD-10-CM

## 2013-04-19 DIAGNOSIS — IMO0002 Reserved for concepts with insufficient information to code with codable children: Secondary | ICD-10-CM

## 2013-04-19 DIAGNOSIS — M1710 Unilateral primary osteoarthritis, unspecified knee: Secondary | ICD-10-CM

## 2013-04-19 DIAGNOSIS — M170 Bilateral primary osteoarthritis of knee: Secondary | ICD-10-CM | POA: Insufficient documentation

## 2013-04-19 MED ORDER — DIAZEPAM 2 MG PO TABS: 2.0000 mg | ORAL_TABLET | Freq: Two times a day (BID) | ORAL | Status: DC | PRN

## 2013-04-19 NOTE — Assessment & Plan Note (Signed)
38 year old female with signs and symptoms of benign paroxysmal positional vertigo. Performed a Dix-Hallpike maneuver with cervical extension and rightward rotation which resulted in improvement in her symptoms.  Use low dose diazepam for acute symptoms.  Handout on BPPV provided.  Patient advised to start exercises for BPPV in 3-5 days.  Patient advised to call office if symptoms persist or worsen.

## 2013-04-19 NOTE — Progress Notes (Signed)
Pre visit review using our clinic review tool, if applicable. No additional management support is needed unless otherwise documented below in the visit note. 

## 2013-04-19 NOTE — Progress Notes (Signed)
Subjective:    Patient ID: Kara [name], female    DOB: 1975-04-20, 38 y.o.   MRN: [id number]  Dizziness  38 year old white female with history of morbid obesity status post gastric bypass,  Depression and anxiety complains of acute onset of vertigo since Friday of last week. Patient reports her symptoms started while she was laying down and was trying to get out of bed. Room started spinning. Patient reports one episode of vomiting the following day. Her symptoms also aggravated by bending forward to pick up items.  Patient denies any headache, visual changes or hearing loss.  She does not take any BP medication.  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   Review of Systems  Neurological: Positive for dizziness.  negative for headache, visual changes or hearing loss.  No recent URI or ear infection.  Past Medical History  Diagnosis Date  . ANXIETY 08/18/2006  . 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 DEPRESSION 02/22/2010  . HYPERLIPIDEMIA 06/11/2007  . HYPOTHYROIDISM 08/18/2006  . OBESITY 06/16/2008  . GERD (gastroesophageal reflux disease)   . Pneumonia     hx of   . PONV (postoperative nausea and vomiting)     used Scopolamine patch    History   Social History  . Marital Status: Married    Spouse Name: N/A    Number of Children: N/A  . Years of Education: N/A   Occupational History  . Not on file.   Social History Main Topics  . Smoking status: Former Smoker    Types: Cigarettes    Quit date: 01/28/2005  . Smokeless tobacco: Never Used  . Alcohol Use: Yes     Comment: occ glass of wine or mixed drink   . Drug Use: No  . Sexual Activity: Not on file   Other Topics Concern  . Not on file   Social History Narrative  . No narrative on file    Past Surgical History  Procedure Laterality Date  . Orif ulnar fracture    . Cholecystectomy N/A 05/08/2012    Procedure: LAPAROSCOPIC CHOLECYSTECTOMY WITH  INTRAOPERATIVE CHOLANGIOGRAM;  Surgeon: Leighton Ruff, MD;  Location: [facility];  Service: General;  Laterality: N/A;  . Knee arthroscopy Left 07/28/12    dr. Sharol Given  . 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 Upper gi endoscopy  12/29/2012    Procedure: UPPER GI ENDOSCOPY;  Surgeon: Madilyn Hook, DO;  Location: [facility];  Service: General;;    Family History  Problem Relation Age of Onset  . Schizophrenia Mother   . Cancer Mother     lung  . Heart disease Father     Allergies  Allergen Reactions  . Asa [Aspirin] Itching and Swelling    Current Outpatient Prescriptions on File Prior to Visit  Medication Sig Dispense Refill  . CALCIUM PO Take 1 tablet by mouth 3 (three) times daily.       . Glucosamine-Chondroitin-MSM (TRIPLE FLEX PO) Take by mouth.      . levothyroxine (SYNTHROID, LEVOTHROID) 125 MCG tablet Take 250 mcg by mouth daily before breakfast.      . medroxyPROGESTERone (DEPO-PROVERA) 150 MG/ML injection Inject 150 mg into the muscle every 3 (three) months. Last dose was 12/10/12      . Misc Natural Products (JOINT SUPPORT PO) Take by mouth.      Marland Kitchen  ondansetron (ZOFRAN ODT) 4 MG disintegrating tablet Take 1 tablet (4 mg total) by mouth every 8 (eight) hours as needed for nausea or vomiting.  20 tablet  0  . Pediatric Multiple Vit-C-FA (FLINSTONES GUMMIES OMEGA-3 DHA) CHEW Chew 1 each by mouth 2 (two) times daily.       . traMADol (ULTRAM) 50 MG tablet Take 1 tablet (50 mg total) by mouth 3 (three) times daily as needed.  90 tablet  5  . venlafaxine (EFFEXOR) 37.5 MG tablet Take 1 tablet (37.5 mg total) by mouth 2 (two) times daily.  180 tablet  3  . vitamin B-12 (CYANOCOBALAMIN) 500 MCG tablet Take 500 mcg by mouth daily.       No current facility-administered medications on file prior to visit.    BP 120/80  Pulse 80  Temp(Src) 98.8 F (37.1 C)  (Oral)  Ht 5' 7"  (1.702 m)  Wt 284 lb (128.822 kg)  BMI 44.47 kg/m2      Objective:   Physical Exam  Constitutional: She is oriented to person, place, and time. She appears well-developed and well-nourished.  HENT:  Head: Normocephalic and atraumatic.  Right Ear: External ear normal.  Left Ear: External ear normal.  Mouth/Throat: Oropharynx is clear and moist.  Eyes: EOM are normal. Pupils are equal, round, and reactive to light.  No defects in peripheral vision  Cardiovascular: Normal rate, regular rhythm and normal heart sounds.   No murmur heard. Pulmonary/Chest: Effort normal and breath sounds normal. She has no wheezes.  Musculoskeletal: She exhibits no edema.  Neurological: She is alert and oriented to person, place, and time. She has normal reflexes. She displays normal reflexes. No cranial nerve deficit. She exhibits normal muscle tone.  Skin: Skin is warm and dry.  Psychiatric: She has a normal mood and affect. Her behavior is normal.          Assessment & Plan:

## 2013-04-19 NOTE — Patient Instructions (Signed)
Start Kara [name] exercised for benign positional vertigo in 3 to 5 days Patient advised to call office if symptoms persist or worsen. Try to reduce your intake of Tramadol to twice daily Follow up with Dr. Leanne Chang within 2 months

## 2013-04-19 NOTE — Assessment & Plan Note (Signed)
Her symptoms worse on right knee.  Last MRI of knee completed in 06/2012   1. Dominant finding is severe tricompartmental osteoarthritis.  2. Large radial tear posterior horn medial meniscus.  3. Joint effusion and findings compatible with synovitis.  4. Marrow edema in the distal femur could be due to degenerative  disease and/or stress change. A much milder degree as edema is  seen in the proximal tibia centrally. No fracture is identified.  5. Findings compatible chronic sprain of the fibular collateral  ligament without tear.  Patient advised to try to decrease her tramadol use.  She will try taking Tramadol 50 mg along with acetaminophen 325 mg as needed.  She will likely need orthopedic follow up.

## 2013-05-04 ENCOUNTER — Other Ambulatory Visit (INDEPENDENT_AMBULATORY_CARE_PROVIDER_SITE_OTHER): Payer: 59

## 2013-05-04 DIAGNOSIS — E785 Hyperlipidemia, unspecified: Secondary | ICD-10-CM

## 2013-05-04 LAB — HEPATIC FUNCTION PANEL
ALK PHOS: 97 U/L (ref 39–117)
ALT: 22 U/L (ref 0–35)
AST: 21 U/L (ref 0–37)
Albumin: 3.7 g/dL (ref 3.5–5.2)
Bilirubin, Direct: 0 mg/dL (ref 0.0–0.3)
TOTAL PROTEIN: 6.9 g/dL (ref 6.0–8.3)
Total Bilirubin: 0.6 mg/dL (ref 0.3–1.2)

## 2013-05-04 LAB — LIPID PANEL
Cholesterol: 158 mg/dL (ref 0–200)
HDL: 29.1 mg/dL — ABNORMAL LOW (ref >39.00)
LDL CALC: 108 mg/dL — AB (ref 0–99)
Total CHOL/HDL Ratio: 5
Triglycerides: 103 mg/dL (ref 0.0–149.0)
VLDL: 20.6 mg/dL (ref 0.0–40.0)

## 2013-05-18 ENCOUNTER — Encounter: Payer: Self-pay | Admitting: Internal Medicine

## 2013-05-18 ENCOUNTER — Ambulatory Visit (INDEPENDENT_AMBULATORY_CARE_PROVIDER_SITE_OTHER): Payer: 59 | Admitting: Internal Medicine

## 2013-05-18 VITALS — BP 108/74 | HR 80 | Temp 98.0°F | Ht 67.0 in | Wt 271.0 lb

## 2013-05-18 DIAGNOSIS — R739 Hyperglycemia, unspecified: Secondary | ICD-10-CM

## 2013-05-18 DIAGNOSIS — M170 Bilateral primary osteoarthritis of knee: Secondary | ICD-10-CM

## 2013-05-18 DIAGNOSIS — E039 Hypothyroidism, unspecified: Secondary | ICD-10-CM

## 2013-05-18 DIAGNOSIS — R7309 Other abnormal glucose: Secondary | ICD-10-CM

## 2013-05-18 DIAGNOSIS — M1710 Unilateral primary osteoarthritis, unspecified knee: Secondary | ICD-10-CM

## 2013-05-18 DIAGNOSIS — IMO0002 Reserved for concepts with insufficient information to code with codable children: Secondary | ICD-10-CM

## 2013-05-18 DIAGNOSIS — Z9884 Bariatric surgery status: Secondary | ICD-10-CM

## 2013-05-18 LAB — HEMOGLOBIN A1C: Hgb A1c MFr Bld: 5.4 % (ref 4.6–6.5)

## 2013-05-18 LAB — TSH: TSH: 0.22 u[IU]/mL — ABNORMAL LOW (ref 0.35–5.50)

## 2013-05-18 NOTE — Progress Notes (Signed)
Gastric by pass Has lost  approx 100 pounds  She still has significant pain of hip/knees  No othe rcomnplaints  Past Medical History  Diagnosis Date  . ANXIETY 08/18/2006  . 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 DEPRESSION 02/22/2010  . HYPERLIPIDEMIA 06/11/2007  . HYPOTHYROIDISM 08/18/2006  . OBESITY 06/16/2008  . GERD (gastroesophageal reflux disease)   . Pneumonia     hx of   . PONV (postoperative nausea and vomiting)     used Scopolamine patch    History   Social History  . Marital Status: Married    Spouse Name: N/A    Number of Children: N/A  . Years of Education: N/A   Occupational History  . Not on file.   Social History Main Topics  . Smoking status: Former Smoker    Types: Cigarettes    Quit date: 01/28/2005  . Smokeless tobacco: Never Used  . Alcohol Use: Yes     Comment: occ glass of wine or mixed drink   . Drug Use: No  . Sexual Activity: Not on file   Other Topics Concern  . Not on file   Social History Narrative  . No narrative on file    Past Surgical History  Procedure Laterality Date  . Orif ulnar fracture    . Cholecystectomy N/A 05/08/2012    Procedure: LAPAROSCOPIC CHOLECYSTECTOMY WITH INTRAOPERATIVE CHOLANGIOGRAM;  Surgeon: Leighton Ruff, MD;  Location: [facility];  Service: General;  Laterality: N/A;  . Knee arthroscopy Left 07/28/12    dr. Sharol Given  . 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 Upper gi endoscopy  12/29/2012    Procedure: UPPER GI ENDOSCOPY;  Surgeon: Madilyn Hook, DO;  Location: [facility];  Service: General;;    Family History  Problem Relation Age of Onset  . Schizophrenia Mother   . Cancer Mother     lung  . Heart disease Father     Allergies  Allergen Reactions  . Asa [Aspirin] Itching and Swelling    Current Outpatient Prescriptions on File Prior to Visit  Medication Sig  Dispense Refill  . CALCIUM PO Take 1 tablet by mouth 3 (three) times daily.       . cetirizine (ZYRTEC) 10 MG tablet Take 10 mg by mouth daily.      . diazepam (VALIUM) 2 MG tablet Take 1 tablet (2 mg total) by mouth every 12 (twelve) hours as needed (dizziness / vertigo).  15 tablet  0  . Glucosamine-Chondroitin-MSM (TRIPLE FLEX PO) Take by mouth.      . levothyroxine (SYNTHROID, LEVOTHROID) 125 MCG tablet Take 250 mcg by mouth daily before breakfast.      . medroxyPROGESTERone (DEPO-PROVERA) 150 MG/ML injection Inject 150 mg into the muscle every 3 (three) months. Last dose was 12/10/12      . Misc Natural Products (JOINT SUPPORT PO) Take by mouth.      . ondansetron (ZOFRAN ODT) 4 MG disintegrating tablet Take 1 tablet (4 mg total) by mouth every 8 (eight) hours as needed for nausea or vomiting.  20 tablet  0  . Pediatric Multiple Vit-C-FA (FLINSTONES GUMMIES OMEGA-3 DHA) CHEW Chew 1 each by mouth 2 (two) times daily.       . traMADol (ULTRAM) 50 MG tablet Take 1 tablet (50 mg total) by mouth 3 (three) times daily as needed.  90 tablet  5  .  venlafaxine (EFFEXOR) 37.5 MG tablet Take 1 tablet (37.5 mg total) by mouth 2 (two) times daily.  180 tablet  3  . vitamin B-12 (CYANOCOBALAMIN) 500 MCG tablet Take 500 mcg by mouth daily.       No current facility-administered medications on file prior to visit.     patient denies chest pain, shortness of breath, orthopnea. Denies lower extremity edema, abdominal pain, change in appetite, change in bowel movements. Patient denies rashes, musculoskeletal complaints. No other specific complaints in a complete review of systems.   BP 108/74  Pulse 80  Temp(Src) 98 F (36.7 C) (Oral)  Ht 5' 7"  (1.702 m)  Wt 271 lb (122.925 kg)  BMI 42.43 kg/m2  Well-developed well-nourished female in no acute distress. HEENT exam atraumatic, normocephalic, extraocular muscles are intact. Neck is supple. No jugular venous distention no thyromegaly. Chest clear to  auscultation without increased work of breathing. Cardiac exam S1 and S2 are regular. Abdominal exam active bowel sounds, soft, nontender. Extremities no edema. Neurologic exam she is alert without any motor sensory deficits. Gait is normal.

## 2013-05-18 NOTE — Progress Notes (Signed)
Pre visit review using our clinic review tool, if applicable. No additional management support is needed unless otherwise documented below in the visit note. 

## 2013-05-21 ENCOUNTER — Ambulatory Visit: Payer: 59 | Admitting: Internal Medicine

## 2013-05-21 NOTE — Assessment & Plan Note (Signed)
Continued weight loss should help

## 2013-05-21 NOTE — Assessment & Plan Note (Signed)
Has lost 100 pounds

## 2013-05-21 NOTE — Assessment & Plan Note (Signed)
Check labs today.

## 2013-05-28 NOTE — Telephone Encounter (Signed)
Error

## 2013-06-15 ENCOUNTER — Encounter: Payer: Self-pay | Admitting: Internal Medicine

## 2013-06-30 ENCOUNTER — Ambulatory Visit: Payer: 59 | Admitting: Dietician

## 2013-08-05 ENCOUNTER — Encounter: Payer: 59 | Attending: Surgery | Admitting: Dietician

## 2013-08-05 VITALS — Ht 67.0 in | Wt 240.5 lb

## 2013-08-05 DIAGNOSIS — Z9884 Bariatric surgery status: Secondary | ICD-10-CM | POA: Insufficient documentation

## 2013-08-05 DIAGNOSIS — Z713 Dietary counseling and surveillance: Secondary | ICD-10-CM | POA: Diagnosis not present

## 2013-08-05 DIAGNOSIS — E669 Obesity, unspecified: Secondary | ICD-10-CM | POA: Diagnosis not present

## 2013-08-05 DIAGNOSIS — R634 Abnormal weight loss: Secondary | ICD-10-CM | POA: Diagnosis not present

## 2013-08-05 NOTE — Progress Notes (Signed)
  Follow-up visit:  7 Months Post-Operative RYGB Surgery  Medical Nutrition Therapy:  Appt start time: 800 end time:  820.  Primary concerns today: Post-operative Bariatric Surgery Nutrition Management. Kara [name] returns today with a 54 lbs weight loss. Tolerating all foods fine. Still does not have appetite, but does find that she likes spicy foods. Cannot tolerate chicken. Having a lot of protein shakes and yogurt. Graduated from BELT program and started doing the [facility].   Pain and mobility are continuing to improve. Started a new job and working 5 days a week.  Surgery date: 12/29/12 Surgery type: RYGB Starting weight at [facility]: 363 lbs on 10/21/12  Weight today: 240.5  lbs Weight change: 54 lbs Total weight lost: 123 lbs Weight loss goal: under 200 lb short term, long term - 140-160 lbs % weight loss goal: 75% (of way to 200 lb goal)  TANITA  BODY COMP RESULTS  01/12/13 02/25/13 03/30/13 08/05/13   BMI (kg/m^2) 52.9 49.0 46.0 37.7   Fat Mass (lbs) 191.5 174.5 164.0 110.5   Fat Free Mass (lbs) 146.0 138.5 130.0 130.0   Total Body Water (lbs) 107.0 101.5 95.0 95.0    Preferred Learning Style:   No preference indicated   Learning Readiness:   Ready  24-hr recall: B (AM): Premier protein or [employer] Shake (25-30 g) Snk (AM): none L ( PM):  greek yogurt   (12g)  Snk (PM): almonds or peanuts  Snk ( PM): EAS shake (17 g) D (PM): 1/4 cup cottage cheese, 1/2 hamburger, spicy chicken salad  (up to 10 g) Snk ( PM): SF popsicle  Fluid intake: at least 64 fl oz - water, Powerade Zero, crystal light or vitamin water + 2 protein shakes Estimated total protein intake: ~64 g   Medications: see list Supplementation: taking  Using straws: No Drinking while eating: No Hair loss: No Carbonated beverages: No, will have flat carbonated water sometimes N/V/D/C: No Dumping syndrome: No  Recent physical activity:  [facility] MWF for 60 minutes, cardio and strength training  Progress  Towards Goal(s):  In progress.    Nutritional Diagnosis:  [place]-3.3 Overweight/obesity related to past poor dietary habits and physical inactivity as evidenced by patient w/ recent RYGB surgery following dietary guidelines for continued weight loss.    Intervention:  Nutrition education/diet enforcement.  Teaching Method Utilized:  Visual Auditory Hands on  Barriers to learning/adherence to lifestyle change: mobility issues, foods don't "taste good"  Demonstrated degree of understanding via:  Teach Back   Monitoring/Evaluation:  Dietary intake, exercise, lap band fills, and body weight. Follow up in 3 months for 10 month post-op visit.

## 2013-08-05 NOTE — Patient Instructions (Signed)
Goals:  Follow Phase 3B: High Protein + Non-Starchy Vegetables  Eat 3-6 small meals/snacks, every 3-5 hrs  Increase lean protein foods to meet 60g goal  Increase fluid intake to 64oz +  Avoid drinking 15 minutes before, during and 30 minutes after eating  Aim for >30 min of physical activity daily  Keep carbs 15 grams or less per meal (use yellow cards as a guide for fruit and starchy vegetables)  If weight loss stalls, go back to cutting the carbs

## 2013-08-17 ENCOUNTER — Other Ambulatory Visit: Payer: Self-pay | Admitting: Internal Medicine

## 2013-08-20 ENCOUNTER — Ambulatory Visit (INDEPENDENT_AMBULATORY_CARE_PROVIDER_SITE_OTHER): Payer: 59 | Admitting: Surgery

## 2013-08-20 ENCOUNTER — Encounter (INDEPENDENT_AMBULATORY_CARE_PROVIDER_SITE_OTHER): Payer: Self-pay | Admitting: Surgery

## 2013-08-20 VITALS — BP 102/68 | HR 71 | Temp 97.1°F | Resp 16 | Ht 67.0 in | Wt 237.4 lb

## 2013-08-20 DIAGNOSIS — Z9884 Bariatric surgery status: Secondary | ICD-10-CM

## 2013-08-20 NOTE — Progress Notes (Signed)
Kara [name] 38 y.o.  Body mass index is 37.17 kg/(m^2).  Patient Active Problem List   Diagnosis Date Noted  . BPPV (benign paroxysmal positional vertigo) 04/19/2013  . Osteoarthritis of both knees 04/19/2013  . 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 DEPRESSION 02/22/2010  . 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 OBESITY 06/16/2008  . HYPERLIPIDEMIA 06/11/2007  . HYPOTHYROIDISM 08/18/2006  . ANXIETY 08/18/2006    Allergies  Allergen Reactions  . Asa [Aspirin] Itching and Swelling      Past Surgical History  Procedure Laterality Date  . Orif ulnar fracture    . Cholecystectomy N/A 05/08/2012    Procedure: LAPAROSCOPIC CHOLECYSTECTOMY WITH INTRAOPERATIVE CHOLANGIOGRAM;  Surgeon: Leighton Ruff, MD;  Location: [facility];  Service: General;  Laterality: N/A;  . Knee arthroscopy Left 07/28/12    dr. Sharol Given  . 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 Upper gi endoscopy  12/29/2012    Procedure: UPPER GI ENDOSCOPY;  Surgeon: Madilyn Hook, DO;  Location: [facility];  Service: General;;   [name], MD No diagnosis found.  Has lost 127 lbs since surgery in December.  Finished BELT program and moving into [facility] program.  Asked about taking surcrafate with NSAIDs or using topical NSAIDs.   Will see back in 6 months.  Matt B. Hassell Done, MD, [facility] [phone] beeper [phone]  08/20/2013 5:43 PM

## 2013-09-09 ENCOUNTER — Telehealth: Payer: Self-pay | Admitting: Internal Medicine

## 2013-09-09 NOTE — Telephone Encounter (Signed)
Lm to c/b and establish with [facility] 09/09/13 ck

## 2013-09-15 NOTE — Telephone Encounter (Signed)
Pt has been scheduled.  °

## 2013-10-05 ENCOUNTER — Other Ambulatory Visit: Payer: Self-pay | Admitting: Obstetrics and Gynecology

## 2013-10-07 LAB — CYTOLOGY - PAP

## 2013-10-11 ENCOUNTER — Other Ambulatory Visit: Payer: Self-pay | Admitting: Internal Medicine

## 2013-11-09 ENCOUNTER — Encounter: Payer: 59 | Attending: Surgery | Admitting: Dietician

## 2013-11-09 DIAGNOSIS — Z713 Dietary counseling and surveillance: Secondary | ICD-10-CM | POA: Insufficient documentation

## 2013-11-09 DIAGNOSIS — Z6834 Body mass index (BMI) 34.0-34.9, adult: Secondary | ICD-10-CM | POA: Insufficient documentation

## 2013-11-09 NOTE — Patient Instructions (Addendum)
Goals:  Follow Phase 3B: High Protein + Non-Starchy Vegetables  Eat 3-6 small meals/snacks, every 3-5 hrs  Increase lean protein foods to meet 60g goal  Increase fluid intake to 64oz +  Avoid drinking 15 minutes before, during and 30 minutes after eating  Aim for >30 min of physical activity daily  Keep carbs 15 grams or less per meal (use yellow cards as a guide for fruit and starchy vegetables)  If weight loss stalls, go back to cutting the carbs  Surgery date: 12/29/12 Surgery type: RYGB Starting weight at [facility]: 363 lbs on 10/21/12  Weight today: 217.5  Lbs Weight change: 23 lbs Total weight lost: 146 lbs Weight loss goal: under 200 lb short term % weight loss goal: 90% (of way to 200 lb goal)  TANITA  BODY COMP RESULTS  01/12/13 02/25/13 03/30/13 08/05/13 11/09/13   BMI (kg/m^2) 52.9 49.0 46.0 37.7 34.1   Fat Mass (lbs) 191.5 174.5 164.0 110.5 95.0   Fat Free Mass (lbs) 146.0 138.5 130.0 130.0 122.5   Total Body Water (lbs) 107.0 101.5 95.0 95.0 89.5

## 2013-11-09 NOTE — Progress Notes (Signed)
  Follow-up visit:  10 Months Post-Operative RYGB Surgery  Medical Nutrition Therapy:  Appt start time: 800 end time:  815.  Primary concerns today: Post-operative Bariatric Surgery Nutrition Management. Kara [name] returns today with a 23 lbs weight loss. Still does not have an appetite. Tends to like spicy foods or foods with more flavor. Is able to tolerate chicken sometimes but no always. Doing the [facility].  Using the stairs now at work instead of the elevator. Pain and mobility are continuing to improve.  Surgery date: 12/29/12 Surgery type: RYGB Starting weight at [facility]: 363 lbs on 10/21/12  Weight today: 217.5  Lbs Weight change: 23 lbs Total weight lost: 146 lbs Weight loss goal: under 200 lb short term % weight loss goal: 90% (of way to 200 lb goal)  TANITA  BODY COMP RESULTS  01/12/13 02/25/13 03/30/13 08/05/13 11/09/13   BMI (kg/m^2) 52.9 49.0 46.0 37.7 34.1   Fat Mass (lbs) 191.5 174.5 164.0 110.5 95.0   Fat Free Mass (lbs) 146.0 138.5 130.0 130.0 122.5   Total Body Water (lbs) 107.0 101.5 95.0 95.0 89.5    Preferred Learning Style:   No preference indicated   Learning Readiness:   Ready  24-hr recall: B (AM): Premier protein or [employer] Shake (25-30 g) has fruit 1 x week Snk (AM): none L ( PM):  greek yogurt  Or premier protein bar  (12g)  Snk (PM): almonds or peanuts with Baby Bell (12 g) D (PM): fat free hot dog, steak, chicken, or yogurt with vegetables(up to 12 g) Snk ( PM): SF popsicle  Fluid intake: at least 64 fl oz - water, Powerade Zero, crystal light or vitamin water + 1 protein shakes Estimated total protein intake: ~60 g   Medications: see list Supplementation: taking  Using straws: No Drinking while eating: No Hair loss: No Carbonated beverages: No N/V/D/C: sometimes gets nauseas and vomits sometimes with chicken Dumping syndrome: No  Recent physical activity:  [facility] program MWF for 60 minutes, cardio and strength training  Progress Towards  Goal(s):  In progress.    Nutritional Diagnosis:  [place]-3.3 Overweight/obesity related to past poor dietary habits and physical inactivity as evidenced by patient w/ recent RYGB surgery following dietary guidelines for continued weight loss.    Intervention:  Nutrition education/diet reinforcement.  Teaching Method Utilized:  Visual Auditory Hands on  Barriers to learning/adherence to lifestyle change: mobility issues, foods don't "taste good"  Demonstrated degree of understanding via:  Teach Back   Monitoring/Evaluation:  Dietary intake, exercise, and body weight. Follow up in 3 months for 13  month post-op visit.

## 2013-11-11 ENCOUNTER — Ambulatory Visit (INDEPENDENT_AMBULATORY_CARE_PROVIDER_SITE_OTHER): Payer: 59

## 2013-11-11 DIAGNOSIS — Z23 Encounter for immunization: Secondary | ICD-10-CM

## 2013-11-22 ENCOUNTER — Other Ambulatory Visit: Payer: Self-pay | Admitting: Internal Medicine

## 2014-02-03 ENCOUNTER — Encounter: Payer: Self-pay | Admitting: Family Medicine

## 2014-02-03 ENCOUNTER — Ambulatory Visit (INDEPENDENT_AMBULATORY_CARE_PROVIDER_SITE_OTHER): Payer: Managed Care, Other (non HMO) | Admitting: Family Medicine

## 2014-02-03 VITALS — BP 100/70 | HR 71 | Temp 97.7°F | Wt 200.0 lb

## 2014-02-03 DIAGNOSIS — E559 Vitamin D deficiency, unspecified: Secondary | ICD-10-CM

## 2014-02-03 DIAGNOSIS — E038 Other specified hypothyroidism: Secondary | ICD-10-CM

## 2014-02-03 DIAGNOSIS — E785 Hyperlipidemia, unspecified: Secondary | ICD-10-CM

## 2014-02-03 DIAGNOSIS — M170 Bilateral primary osteoarthritis of knee: Secondary | ICD-10-CM

## 2014-02-03 DIAGNOSIS — E034 Atrophy of thyroid (acquired): Secondary | ICD-10-CM

## 2014-02-03 DIAGNOSIS — Z9884 Bariatric surgery status: Secondary | ICD-10-CM

## 2014-02-03 DIAGNOSIS — J309 Allergic rhinitis, unspecified: Secondary | ICD-10-CM | POA: Insufficient documentation

## 2014-02-03 LAB — COMPREHENSIVE METABOLIC PANEL
ALT: 15 U/L (ref 0–35)
AST: 20 U/L (ref 0–37)
Albumin: 3.9 g/dL (ref 3.5–5.2)
Alkaline Phosphatase: 98 U/L (ref 39–117)
BUN: 9 mg/dL (ref 6–23)
CALCIUM: 9.2 mg/dL (ref 8.4–10.5)
CHLORIDE: 109 meq/L (ref 96–112)
CO2: 25 mEq/L (ref 19–32)
CREATININE: 0.6 mg/dL (ref 0.4–1.2)
GFR: 124.31 mL/min (ref >60.00)
GLUCOSE: 77 mg/dL (ref 70–99)
Potassium: 4.3 mEq/L (ref 3.5–5.1)
Sodium: 141 mEq/L (ref 135–145)
Total Bilirubin: 0.6 mg/dL (ref 0.2–1.2)
Total Protein: 7 g/dL (ref 6.0–8.3)

## 2014-02-03 LAB — CBC
HEMATOCRIT: 40 % (ref 36.0–46.0)
HEMOGLOBIN: 13.3 g/dL (ref 12.0–15.0)
MCHC: 33.2 g/dL (ref 30.0–36.0)
MCV: 85.9 fl (ref 78.0–100.0)
PLATELETS: 308 10*3/uL (ref 150.0–400.0)
RBC: 4.66 Mil/uL (ref 3.87–5.11)
RDW: 13.4 % (ref 11.5–15.5)
WBC: 3.9 10*3/uL — AB (ref 4.0–10.5)

## 2014-02-03 LAB — VITAMIN B12: VITAMIN B 12: 631 pg/mL (ref 211–911)

## 2014-02-03 LAB — IRON AND TIBC
%SAT: 25 % (ref 20–55)
Iron: 73 ug/dL (ref 42–145)
TIBC: 295 ug/dL (ref 250–470)
UIBC: 222 ug/dL (ref 125–400)

## 2014-02-03 LAB — TSH: TSH: 0.06 u[IU]/mL — AB (ref 0.35–4.50)

## 2014-02-03 LAB — FERRITIN: Ferritin: 88.5 ng/mL (ref 10.0–291.0)

## 2014-02-03 LAB — FOLATE

## 2014-02-03 LAB — VITAMIN D 25 HYDROXY (VIT D DEFICIENCY, FRACTURES): VITD: 24.11 ng/mL — AB (ref 30.00–100.00)

## 2014-02-03 MED ORDER — LEVOTHYROXINE SODIUM 112 MCG PO TABS: 224.0000 ug | ORAL_TABLET | Freq: Every day | ORAL | Status: DC

## 2014-02-03 NOTE — Patient Instructions (Signed)
Recheck thyroid-may need to go down on dose.   Check labs related to bypass.   Congrats on 165 lb weight loss! I am thrilled for you.   Most likely follow up in 6 months unless thyroid shows we need sooner follow up.   Spot on back may be a cyst or a lipoma.

## 2014-02-03 NOTE — Assessment & Plan Note (Signed)
Decrease Levothyroxine to 224 mcg (from 225mg daily TSH <0.22). Repeat TSH after 6 weeks.

## 2014-02-03 NOTE — Assessment & Plan Note (Signed)
Check vitamins per labs. Only abnormality so far is low vitamin d (see separate section)

## 2014-02-03 NOTE — Assessment & Plan Note (Signed)
At 24. Recommend 800 IU daily at a minimum. Recheck yearly. If stays in this range consider 50,000 unit boost x 8 weeks.

## 2014-02-03 NOTE — Assessment & Plan Note (Signed)
Reasonable control. Continue Tramadol prn 1-2x a day.

## 2014-02-03 NOTE — Assessment & Plan Note (Signed)
Mild poor control with ldl 108 but hopeful this will be improved on next  Check with continued weight loss. Advised continued exercise/healthy eating/weight loss. Hope for weight around 200

## 2014-02-03 NOTE — Progress Notes (Signed)
Kara Reddish, MD Phone: [phone]  Subjective:  Patient presents today to establish care with me as their new primary care provider. Patient was formerly a patient of Dr. Leanne Chang. Chief complaint-noted.  Hypothyroidism-previously slightly overtreated, not clear if dose was decreased On thyroid medication-levothyroxine 263mg ROS-No hair or nail changes. No heat/cold intolerance. No constipation or diarrhea. Denies shakiness. No palpitations. Continuing to lose weight  S/p gastric bypass- stable Vitamin D deficiency- new Down 165 lbs from peak. [employer] hope program for exercise (transitioned after hope program) 3 days a week. Has never had vitamins tested since surgery. Takes b12, flinstones MV, calcium. On labs vitamin d deficiency found ROS-  No extremity weakness or memory loss. Has an itchy spot on her back which she thought was a cyst but would not drain.   Hyperlipidemia-mild poor control suspect improving  Lab Results  Component Value Date   LDLCALC 108* 05/04/2013  On statin: no Regular exercise: yes Diet: driven by bypass ROS- no chest pain or shortness of breath. No myalgias  Osteoarthritis of knees- reasonable control of pain Controlled with tramadol 1-2x per day. Much more mobile now.  ROS- no hot, swollen joint    The following were reviewed and entered/updated in epic: Past Medical History  Diagnosis Date  . ANXIETY 08/18/2006  . 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 DEPRESSION 02/22/2010  . HYPERLIPIDEMIA 06/11/2007  . HYPOTHYROIDISM 08/18/2006  . OBESITY 06/16/2008  . GERD (gastroesophageal reflux disease)   . Pneumonia     hx of   . PONV (postoperative nausea and vomiting)     used Scopolamine patch   Patient Active Problem List   Diagnosis Date Noted  . 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 Depression 02/22/2010    Priority: Medium  . OBESITY 06/16/2008    Priority: Medium  . Hyperlipidemia 06/11/2007   Priority: Medium  . Hypothyroidism 08/18/2006    Priority: Medium  . Allergic rhinitis 02/03/2014    Priority: Low  . BPPV (benign paroxysmal positional vertigo) 04/19/2013    Priority: Low  . Osteoarthritis of both knees 04/19/2013    Priority: Low  . DJD (degenerative joint disease) 10/26/2009    Priority: Low  . Vitamin D deficiency 02/03/2014   Past Surgical History  Procedure Laterality Date  . Orif ulnar fracture  1995    plates and pins  . Cholecystectomy N/A 05/08/2012    Procedure: LAPAROSCOPIC CHOLECYSTECTOMY WITH INTRAOPERATIVE CHOLANGIOGRAM;  Surgeon: ALeighton Ruff MD;  Location: [facility];  Service: General;  Laterality: N/A;  . Knee arthroscopy Left 07/28/12    dr. dSharol Given . Gastric roux-en-y  12/29/2012    Procedure: LAPAROSCOPIC ROUX-EN-Y GASTRIC BYPASS WITH UPPER ENDOSCOPY;  Surgeon: BMadilyn Hook DO;  Location: [facility];  Service: General;;  . Upper gi endoscopy  12/29/2012    Procedure: UPPER GI ENDOSCOPY;  Surgeon: BMadilyn Hook DO;  Location: [facility];  Service: General;;    Family History  Problem Relation Age of Onset  . Schizophrenia Mother   . Cancer Mother     lung, smoker  . Heart disease Father 458   smoker at time  . Breast cancer Mother 560 . Uterine cancer Mother     Medications- reviewed and updated Current Outpatient Prescriptions  Medication Sig Dispense Refill  . CALCIUM PO Take 1 tablet by mouth 3 (three) times daily.     . cetirizine (ZYRTEC) 10 MG tablet Take 10 mg by mouth daily.    .Marland Kitchen  levothyroxine (SYNTHROID, LEVOTHROID) 125 MCG tablet TAKE TWO TABLETS (TO TOTAL 250 MCG) BY MOUTH DAILY. 180 tablet 1  . medroxyPROGESTERone (DEPO-PROVERA) 150 MG/ML injection Inject 150 mg into the muscle every 3 (three) months. Last dose was 12/10/12    . Misc Natural Products (JOINT SUPPORT PO) Take by mouth.    . Pediatric Multiple Vit-C-FA (FLINSTONES GUMMIES OMEGA-3 DHA) CHEW Chew 1 each by mouth 2 (two) times daily.     . traMADol (ULTRAM) 50 MG tablet TAKE  ONE TABLET BY MOUTH THREE TIMES DAILY AS NEEDED 90 tablet 3  . venlafaxine (EFFEXOR) 37.5 MG tablet Take 1 tablet (37.5 mg total) by mouth 2 (two) times daily. 180 tablet 3  . vitamin B-12 (CYANOCOBALAMIN) 500 MCG tablet Take 500 mcg by mouth daily.    . diazepam (VALIUM) 2 MG tablet Take 1 tablet (2 mg total) by mouth every 12 (twelve) hours as needed (dizziness / vertigo). (Patient not taking: Reported on 02/03/2014) 15 tablet 0  . ondansetron (ZOFRAN ODT) 4 MG disintegrating tablet Take 1 tablet (4 mg total) by mouth every 8 (eight) hours as needed for nausea or vomiting. (Patient not taking: Reported on 02/03/2014) 20 tablet 0   No current facility-administered medications for this visit.    Allergies-reviewed and updated Allergies  Allergen Reactions  . Asa [Aspirin] Itching and Swelling    History   Social History  . Marital Status: Married    Spouse Name: N/A    Number of Children: N/A  . Years of Education: N/A   Social History Main Topics  . Smoking status: Former Smoker    Types: Cigarettes    Quit date: 01/28/2005  . Smokeless tobacco: Never Used  . Alcohol Use: Yes     Comment: occ glass of wine or mixed drink   . Drug Use: No  . Sexual Activity: Not on file   Other Topics Concern  . Not on file   Social History Narrative   Married (husband transgender female to female). No children. Several pets-1 dog, 4 cats, 2 [place] pigs.       Vet tech/receptionist [employer]      Hobbies: enjoys Luther Parody, Warehouse manager, working out    ROS--See HPI   Objective: BP 100/70 mmHg  Pulse 71  Temp(Src) 97.7 F (36.5 C)  Wt 200 lb (90.719 kg) Gen: NAD, resting comfortably on table, moves slowly to table due to knee pain HEENT: Mucous membranes are moist. Oropharynx normal CV: RRR no murmurs rubs or gallops Lungs: CTAB no crackles, wheeze, rhonchi Abdomen: soft/nontender/nondistended/normal bowel sounds. No rebound or guarding.  Ext: no edema Skin: warm, dry, no  rash Neuro: grossly normal, moves all extremities, PERRLA   Assessment/Plan:  Hypothyroidism Decrease Levothyroxine to 224 mcg (from 271mg daily TSH <0.22). Repeat TSH after 6 weeks.    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 Only abnormality so far is low vitamin d (see separate section)  Vitamin D deficiency At 299 Recommend 800 IU daily at a minimum. Recheck yearly. If stays in this range consider 50,000 unit boost x 8 weeks.  Hyperlipidemia Mild poor control with ldl 108 but hopeful this will be improved on next  Check with continued weight loss. Advised continued exercise/healthy eating/weight loss. Hope for weight around 200  Osteoarthritis of both knees Reasonable control. Continue Tramadol prn 1-2x a day.    Return precautions advised. 2 month follow up for TSH.  Small lipoma vs. Cyst on back-monitor.  Orders Placed This Encounter  Procedures  . TSH    [place]  . CBC    [place]  . Comprehensive metabolic panel    [place]  . Ferritin  . Iron and TIBC  . Vit D  25 hydroxy (rtn osteoporosis monitoring)    [place]  . Vitamin B12  . Vitamin B1  . Folate  . Ceruloplasmin  . Zinc    Meds ordered this encounter  Medications  . levothyroxine (SYNTHROID, LEVOTHROID) 112 MCG tablet    Sig: Take 2 tablets (224 mcg total) by mouth daily.    Dispense:  60 tablet    Refill:  3

## 2014-02-05 LAB — ZINC: ZINC: 81 ug/dL (ref 60–130)

## 2014-02-07 LAB — CERULOPLASMIN: CERULOPLASMIN: 28 mg/dL (ref 18–53)

## 2014-02-07 LAB — VITAMIN B1: VITAMIN B1 (THIAMINE): 20 nmol/L (ref 8–30)

## 2014-02-10 ENCOUNTER — Ambulatory Visit: Payer: 59 | Admitting: Dietician

## 2014-02-11 ENCOUNTER — Other Ambulatory Visit: Payer: Self-pay | Admitting: Internal Medicine

## 2014-02-14 ENCOUNTER — Encounter: Payer: Managed Care, Other (non HMO) | Attending: Surgery | Admitting: Dietician

## 2014-02-14 DIAGNOSIS — Z6831 Body mass index (BMI) 31.0-31.9, adult: Secondary | ICD-10-CM | POA: Insufficient documentation

## 2014-02-14 DIAGNOSIS — Z713 Dietary counseling and surveillance: Secondary | ICD-10-CM | POA: Diagnosis not present

## 2014-02-14 NOTE — Progress Notes (Signed)
  Follow-up visit:  12 Months Post-Operative RYGB Surgery  Medical Nutrition Therapy:  Appt start time: 800 end time:  815.  Primary concerns today: Post-operative Bariatric Surgery Nutrition Management. Kara [name] returns today with a 17 lbs weight loss. Has achieved her first goal of getting to 200 lbs. Walked a 5k in November and has lost 163 lbs so far.    Still has not appetite.  Surgery date: 12/29/12 Surgery type: RYGB Starting weight at [facility]: 363 lbs on 10/21/12  Weight today: 200.5 Lbs Weight change: 17 lbs Total weight lost: 163 lbs Weight loss goal: under 200 lb short term - acheived New weight loss goal: 180 lbs  TANITA  BODY COMP RESULTS  01/12/13 02/25/13 03/30/13 08/05/13 11/09/13 02/14/14   BMI (kg/m^2) 52.9 49.0 46.0 37.7 34.1 31.4   Fat Mass (lbs) 191.5 174.5 164.0 110.5 95.0 81.0   Fat Free Mass (lbs) 146.0 138.5 130.0 130.0 122.5 119.5   Total Body Water (lbs) 107.0 101.5 95.0 95.0 89.5 87.5    Preferred Learning Style:   No preference indicated   Learning Readiness:   Ready  24-hr recall: B (AM): Premier protein or GNC Shake (25-30 g) has fruit 1 x week Snk (AM): none L ( PM):  premier protein bar  Or couple slices of lunch meat or [place] protein pack (7-20g)  Snk (PM): almonds or peanuts with Baby Bell (12 g) D (PM): fat free hot dog or 2 oz steak, chicken, with vegetables up to (14 g) Snk ( PM): SF popsicle 1 x week   Fluid intake: at least 64 fl oz - water, Powerade Zero, crystal light or vitamin water + 1 protein shakes Estimated total protein intake: ~60 g   Medications: see list Supplementation: taking  Using straws: No Drinking while eating: No Hair loss: No Carbonated beverages: No N/V/D/C: No Dumping syndrome: No  Recent physical activity:  Hope program MWF for 60 minutes, cardio and strength training  Progress Towards Goal(s):  In progress.    Nutritional Diagnosis:  Kara [name]-3.3 Overweight/obesity related to past poor dietary habits and  physical inactivity as evidenced by patient w/ recent RYGB surgery following dietary guidelines for continued weight loss.    Intervention:  Nutrition education/diet reinforcement.  Goals:  Follow Phase 3B: High Protein + Non-Starchy Vegetables  Eat 3-6 small meals/snacks, every 3-5 hrs  Increase lean protein foods to meet 60g goal  Increase fluid intake to 64oz +  Avoid drinking 15 minutes before, during and 30 minutes after eating  Aim for >30 min of physical activity daily  Keep carbs 15 grams or less per meal (use yellow cards as a guide for fruit and starchy vegetables)  If weight loss stalls, go back to cutting the carbs  Start taking Vitamin D supplement  Teaching Method Utilized:  Visual Auditory Hands on  Barriers to learning/adherence to lifestyle change: mobility issues, still no appetite  Demonstrated degree of understanding via:  Teach Back   Monitoring/Evaluation:  Dietary intake, exercise, and body weight. Follow up in 6 months for 18  month post-op visit.

## 2014-02-14 NOTE — Patient Instructions (Addendum)
Goals:  Follow Phase 3B: High Protein + Non-Starchy Vegetables  Eat 3-6 small meals/snacks, every 3-5 hrs  Increase lean protein foods to meet 60g goal  Increase fluid intake to 64oz +  Avoid drinking 15 minutes before, during and 30 minutes after eating  Aim for >30 min of physical activity daily  Keep carbs 15 grams or less per meal (use yellow cards as a guide for fruit and starchy vegetables)  If weight loss stalls, go back to cutting the carbs  Start taking Vitamin D supplement  Surgery date: 12/29/12 Surgery type: RYGB Starting weight at [facility]: 363 lbs on 10/21/12  Weight today: 200.5 Lbs Weight change: 17 lbs Total weight lost: 163 lbs Weight loss goal: under 200 lb short term - acheived New weight loss goal: 180 lbs  TANITA  BODY COMP RESULTS  01/12/13 02/25/13 03/30/13 08/05/13 11/09/13 02/14/14   BMI (kg/m^2) 52.9 49.0 46.0 37.7 34.1 31.4   Fat Mass (lbs) 191.5 174.5 164.0 110.5 95.0 81.0   Fat Free Mass (lbs) 146.0 138.5 130.0 130.0 122.5 119.5   Total Body Water (lbs) 107.0 101.5 95.0 95.0 89.5 87.5

## 2014-04-27 ENCOUNTER — Encounter: Payer: Self-pay | Admitting: Family Medicine

## 2014-04-27 ENCOUNTER — Ambulatory Visit (INDEPENDENT_AMBULATORY_CARE_PROVIDER_SITE_OTHER): Payer: Managed Care, Other (non HMO) | Admitting: Family Medicine

## 2014-04-27 VITALS — BP 100/68 | HR 82 | Temp 98.1°F | Wt 186.0 lb

## 2014-04-27 DIAGNOSIS — R112 Nausea with vomiting, unspecified: Secondary | ICD-10-CM

## 2014-04-27 MED ORDER — ONDANSETRON 4 MG PO TBDP: 4.0000 mg | ORAL_TABLET | Freq: Three times a day (TID) | ORAL | Status: DC | PRN

## 2014-04-27 NOTE — Progress Notes (Signed)
   Subjective:    Patient ID: Kara [name], female    DOB: 19-Oct-1975, 39 y.o.   MRN: [id number]  HPI Patient seen with abdominal pain. Onset last Thursday of nausea and vomiting and diarrhea. Her past history significant for gastric bypass surgery with Roux-en-Y procedure back in December 2014. She has generally done very well since that time. She developed several episodes of vomiting last Thursday and things seem to settle down some over the weekend but then she had recurrence of vomiting yesterday. Her diarrhea has ceased. She never had any bloody diarrhea. No melena. No hematemesis. She had leftover Zofran which helped her nausea significantly. She's had previous cholecystectomy. He's had some diffuse abdominal cramping but no localized pains. Keeping down fluids without difficulty this time  Past Medical History  Diagnosis Date  . ANXIETY 08/18/2006  . 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 DEPRESSION 02/22/2010  . HYPERLIPIDEMIA 06/11/2007  . HYPOTHYROIDISM 08/18/2006  . OBESITY 06/16/2008  . GERD (gastroesophageal reflux disease)   . Pneumonia     hx of   . PONV (postoperative nausea and vomiting)     used Scopolamine patch   Past Surgical History  Procedure Laterality Date  . Orif ulnar fracture  1995    plates and pins  . Cholecystectomy N/A 05/08/2012    Procedure: LAPAROSCOPIC CHOLECYSTECTOMY WITH INTRAOPERATIVE CHOLANGIOGRAM;  Surgeon: Leighton Ruff, MD;  Location: [facility];  Service: General;  Laterality: N/A;  . Knee arthroscopy Left 07/28/12    dr. Sharol Given  . 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 Upper gi endoscopy  12/29/2012    Procedure: UPPER GI ENDOSCOPY;  Surgeon: Madilyn Hook, DO;  Location: [facility];  Service: General;;    reports that she quit smoking about 9 years ago. Her smoking use included Cigarettes. She has never used  smokeless tobacco. She reports that she drinks alcohol. She reports that she does not use illicit drugs. family history includes Breast cancer (age of onset: 24) in her mother; Cancer in her mother; Heart disease (age of onset: 21) in her father; Schizophrenia in her mother; Uterine cancer in her mother. Allergies  Allergen Reactions  . Asa [Aspirin] Itching and Swelling      Review of Systems  Constitutional: Negative for fever and chills.  Respiratory: Negative for shortness of breath.   Cardiovascular: Negative for chest pain.  Gastrointestinal: Positive for nausea. Negative for vomiting, blood in stool and abdominal distention.  Genitourinary: Negative for dysuria.  Neurological: Negative for dizziness.       Objective:   Physical Exam  Constitutional: She appears well-developed and well-nourished.  Cardiovascular: Normal rate and regular rhythm.   Pulmonary/Chest: Effort normal and breath sounds normal. No respiratory distress. She has no wheezes. She has no rales.  Abdominal: Soft. Bowel sounds are normal. She exhibits no distension and no mass. There is no tenderness. There is no rebound and no guarding.          Assessment & Plan:  Nausea and vomiting. Suspect viral gastroenteritis. She had some associated diarrhea which is now resolved. Her nausea is actually improved today. Refill Zofran 4 mg ODT for as needed use. Gradually advance diet as tolerated.

## 2014-04-27 NOTE — Progress Notes (Signed)
Pre visit review using our clinic review tool, if applicable. No additional management support is needed unless otherwise documented below in the visit note. 

## 2014-05-02 ENCOUNTER — Other Ambulatory Visit: Payer: Managed Care, Other (non HMO)

## 2014-05-19 ENCOUNTER — Other Ambulatory Visit: Payer: Self-pay | Admitting: Family Medicine

## 2014-05-23 ENCOUNTER — Ambulatory Visit (INDEPENDENT_AMBULATORY_CARE_PROVIDER_SITE_OTHER): Payer: Managed Care, Other (non HMO) | Admitting: Family Medicine

## 2014-05-23 ENCOUNTER — Encounter: Payer: Self-pay | Admitting: Family Medicine

## 2014-05-23 VITALS — BP 108/82 | HR 67 | Temp 98.1°F | Wt 187.0 lb

## 2014-05-23 DIAGNOSIS — E038 Other specified hypothyroidism: Secondary | ICD-10-CM

## 2014-05-23 DIAGNOSIS — E034 Atrophy of thyroid (acquired): Secondary | ICD-10-CM

## 2014-05-23 DIAGNOSIS — E785 Hyperlipidemia, unspecified: Secondary | ICD-10-CM | POA: Diagnosis not present

## 2014-05-23 DIAGNOSIS — F329 Major depressive disorder, single episode, unspecified: Secondary | ICD-10-CM | POA: Diagnosis not present

## 2014-05-23 DIAGNOSIS — F32A Depression, unspecified: Secondary | ICD-10-CM

## 2014-05-23 LAB — LDL CHOLESTEROL, DIRECT: LDL DIRECT: 96 mg/dL

## 2014-05-23 LAB — TSH: TSH: 0.11 u[IU]/mL — ABNORMAL LOW (ref 0.35–4.50)

## 2014-05-23 MED ORDER — LEVOTHYROXINE SODIUM 200 MCG PO TABS: 200.0000 ug | ORAL_TABLET | Freq: Every day | ORAL | Status: DC

## 2014-05-23 NOTE — Assessment & Plan Note (Signed)
Effexor 37.61m daily- trial down from BID due to excellent control.  Return precautions advised.

## 2014-05-23 NOTE — Progress Notes (Signed)
Garret Reddish, MD Phone: [phone]  Subjective:   Kara [name] is a 39 y.o. year old very pleasant female patient who presents with the following:  Hypothyroidism-poor control at last visit,TSH low so decreased levothyroxine to 224 mcg.  Lab Results  Component Value Date   TSH 0.06* 02/03/2014   On thyroid medication-levothyroxine 224 mcg ROS-No hair or nail changes. No heat/cold intolerance. No constipation or diarrhea. Denies shakiness or anxiety.   Depression Diagnosed around 2007. Never trialed off meds. PHQ9 today is 0. States emotional well being has been much improved since weight loss.  ROS- No SI/HI.    Hyperlipidemia-mild poor control last LDL without meds  Lab Results  Component Value Date   LDLCALC 108* 05/04/2013   On statin: no Regular exercise: yes, HOPE program Diet: s/p gastric bypass doing well ROS- no chest pain or shortness of breath. No myalgias  Past Medical History- Patient Active Problem List   Diagnosis Date Noted  . 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 Depression 02/22/2010    Priority: Medium  . OBESITY 06/16/2008    Priority: Medium  . Hyperlipidemia 06/11/2007    Priority: Medium  . Hypothyroidism 08/18/2006    Priority: Medium  . Allergic rhinitis 02/03/2014    Priority: Low  . Vitamin D deficiency 02/03/2014    Priority: Low  . BPPV (benign paroxysmal positional vertigo) 04/19/2013    Priority: Low  . Osteoarthritis of both knees 04/19/2013    Priority: Low  . DJD (degenerative joint disease) 10/26/2009    Priority: Low   Medications- reviewed and updated Current Outpatient Prescriptions  Medication Sig Dispense Refill  . CALCIUM PO Take 1 tablet by mouth 3 (three) times daily.     . cetirizine (ZYRTEC) 10 MG tablet Take 10 mg by mouth daily.    Marland Kitchen levothyroxine (SYNTHROID, LEVOTHROID) 112 MCG tablet Take 2 tablets (224 mcg total) by mouth daily. 60 tablet 3  . medroxyPROGESTERone (DEPO-PROVERA)  150 MG/ML injection Inject 150 mg into the muscle every 3 (three) months. Last dose was 12/10/12    . Misc Natural Products (JOINT SUPPORT PO) Take by mouth.    . Pediatric Multiple Vit-C-FA (FLINSTONES GUMMIES OMEGA-3 DHA) CHEW Chew 1 each by mouth 2 (two) times daily.     . traMADol (ULTRAM) 50 MG tablet TAKE ONE TABLET BY MOUTH THREE TIMES DAILY AS NEEDED 90 tablet 3  . venlafaxine (EFFEXOR) 37.5 MG tablet TAKE ONE TABLET BY MOUTH TWICE DAILY 180 tablet 3  . vitamin B-12 (CYANOCOBALAMIN) 500 MCG tablet Take 500 mcg by mouth daily.    . diazepam (VALIUM) 2 MG tablet Take 1 tablet (2 mg total) by mouth every 12 (twelve) hours as needed (dizziness / vertigo). (Patient not taking: Reported on 05/23/2014) 15 tablet 0  . ondansetron (ZOFRAN ODT) 4 MG disintegrating tablet Take 1 tablet (4 mg total) by mouth every 8 (eight) hours as needed for nausea or vomiting. (Patient not taking: Reported on 05/23/2014) 30 tablet 0   No current facility-administered medications for this visit.    Objective: BP 108/82 mmHg  Pulse 67  Temp(Src) 98.1 F (36.7 C)  Wt 187 lb (84.823 kg) Gen: NAD, resting comfortably CV: RRR no murmurs rubs or gallops Lungs: CTAB no crackles, wheeze, rhonchi Abdomen: soft/nontender/nondistended/normal bowel sounds.  Ext: no edema Skin: warm, dry, no rash Neuro: grossly normal, moves all extremities, walks with limp due to OA   Assessment/Plan:  Hypothyroidism Recheck TSH today. Patient  will need refill on 224 mcg levothyroxine if in range or filled new rx if still low.    Hyperlipidemia Check direct LDL. Last one above goal of LDL <100. Hopeful below goal with continued diet/ exercise. Down about 200 lbs!.    Depression Effexor 37.36m daily- trial down from BID due to excellent control.  Return precautions advised.    July follow up.   Needs refill on levothyroxine if normal (2 weeks left of med) Orders Placed This Encounter  Procedures  . TSH    [place]  . LDL  cholesterol, direct    [place]

## 2014-05-23 NOTE — Addendum Note (Signed)
Addended by: Marin Olp on: 05/23/2014 07:30 PM   Modules accepted: Orders

## 2014-05-23 NOTE — Assessment & Plan Note (Signed)
Check direct LDL. Last one above goal of LDL <100. Hopeful below goal with continued diet/ exercise. Down about 200 lbs!Marland Kitchen

## 2014-05-23 NOTE — Patient Instructions (Addendum)
Check thyroid and bad cholesterol today.   We will notify you if any changes needed in thyroid.   Cut down to 1 pill of the venlafaxine per day, follow up if any recurrence of depressive symptoms. At 6 months, may consider trialing off completely.   Let's check in July if any concerns, if not can reschedule to 4-6 months from today

## 2014-05-23 NOTE — Assessment & Plan Note (Signed)
Recheck TSH today. Patient will need refill on 224 mcg levothyroxine if in range or filled new rx if still low.

## 2014-05-24 NOTE — Addendum Note (Signed)
Addended by: Clyde Lundborg A on: 05/24/2014 08:16 AM   Modules accepted: Orders

## 2014-06-06 ENCOUNTER — Other Ambulatory Visit: Payer: Self-pay | Admitting: Internal Medicine

## 2014-06-06 NOTE — Telephone Encounter (Signed)
May fill

## 2014-08-02 ENCOUNTER — Other Ambulatory Visit: Payer: Self-pay | Admitting: Family Medicine

## 2014-08-02 NOTE — Telephone Encounter (Signed)
Yes thanks 

## 2014-08-02 NOTE — Telephone Encounter (Signed)
Refill ok? 

## 2014-08-08 ENCOUNTER — Ambulatory Visit: Payer: Managed Care, Other (non HMO) | Admitting: Family Medicine

## 2014-08-15 ENCOUNTER — Encounter: Payer: Self-pay | Admitting: Dietician

## 2014-08-15 ENCOUNTER — Encounter: Payer: Managed Care, Other (non HMO) | Attending: Surgery | Admitting: Dietician

## 2014-08-15 VITALS — Ht 67.0 in | Wt 177.5 lb

## 2014-08-15 DIAGNOSIS — Z6827 Body mass index (BMI) 27.0-27.9, adult: Secondary | ICD-10-CM | POA: Diagnosis not present

## 2014-08-15 DIAGNOSIS — Z48815 Encounter for surgical aftercare following surgery on the digestive system: Secondary | ICD-10-CM | POA: Diagnosis not present

## 2014-08-15 DIAGNOSIS — Z9884 Bariatric surgery status: Secondary | ICD-10-CM | POA: Diagnosis not present

## 2014-08-15 DIAGNOSIS — Z713 Dietary counseling and surveillance: Secondary | ICD-10-CM | POA: Insufficient documentation

## 2014-08-15 DIAGNOSIS — E669 Obesity, unspecified: Secondary | ICD-10-CM | POA: Diagnosis not present

## 2014-08-15 NOTE — Patient Instructions (Addendum)
Goals:  Follow Phase 3B: High Protein + Non-Starchy Vegetables  Eat 3-6 small meals/snacks, every 3-5 hrs  Increase lean protein foods to meet 60g goal  Increase fluid intake to 64oz +  Avoid drinking 15 minutes before, during and 30 minutes after eating  Aim for >30 min of physical activity daily  Keep up the good work!  Surgery date: 12/29/12 Surgery type: RYGB Starting weight at [facility]: 363 lbs on 10/21/12  Weight today: 177.5 lbs  Weight change: 23 lbs Total weight lost: 186 lbs Weight loss goal: under 200 lb short term - acheived New weight loss goal: 180 lbs - achieved!   TANITA  BODY COMP RESULTS  01/12/13 02/25/13 03/30/13 08/05/13 11/09/13 02/14/14 08/15/14   BMI (kg/m^2) 52.9 49.0 46.0 37.7 34.1 31.4 27.8   Fat Mass (lbs) 191.5 174.5 164.0 110.5 95.0 81.0 66.5   Fat Free Mass (lbs) 146.0 138.5 130.0 130.0 122.5 119.5 111.0   Total Body Water (lbs) 107.0 101.5 95.0 95.0 89.5 87.5 81.5

## 2014-08-15 NOTE — Progress Notes (Signed)
  Follow-up visit:  19 Months Post-Operative RYGB Surgery  Medical Nutrition Therapy:  Appt start time: 840 end time: 900.  Primary concerns today: Post-operative Bariatric Surgery Nutrition Management. Kara [name] returns today with a 23 lbs weight loss. Has achieved weight loss goal. Now a size 14. Feeling good. Appetite is getting better and feels hunger good.   Rice and pasta still do not agree with her. Only taking 1 depression medication (Effexor) per day now.   Walked another 5 k in April and decreased time.   Surgery date: 12/29/12 Surgery type: RYGB Starting weight at [facility]: 363 lbs on 10/21/12  Weight today: 177.5 lbs  Weight change: 23 lbs Total weight lost: 186 lbs Weight loss goal: under 200 lb short term - acheived New weight loss goal: 180 lbs - achieved!   TANITA  BODY COMP RESULTS  01/12/13 02/25/13 03/30/13 08/05/13 11/09/13 02/14/14 08/15/14   BMI (kg/m^2) 52.9 49.0 46.0 37.7 34.1 31.4 27.8   Fat Mass (lbs) 191.5 174.5 164.0 110.5 95.0 81.0 66.5   Fat Free Mass (lbs) 146.0 138.5 130.0 130.0 122.5 119.5 111.0   Total Body Water (lbs) 107.0 101.5 95.0 95.0 89.5 87.5 81.5    Preferred Learning Style:   No preference indicated   Learning Readiness:   Ready  24-hr recall: B (AM): Premier protein or [employer] Shake (25-30 g) has fruit 1 x week Snk (AM): none L ( PM):  premier protein bar and [place] yogurt or hummus with pretzels and yogurt or chicken or 3/4 bean burrito (15-20g)  Snk (PM): almonds or peanuts with Baby Bell (12 g) D (PM): 3-4 oz ground beef, chicken, with vegetables (21-28 g) Snk ( PM): SF popsicle   Fluid intake: at least 64 fl oz - water, Powerade Zero, crystal light or vitamin water + 1 protein shake Estimated total protein intake: ~60 g   Medications: see list Supplementation: taking  Using straws: No Drinking while eating: No Hair loss: No Carbonated beverages: No N/V/D/C: vomiting when tried pasta/rice Dumping syndrome: No  Recent physical  activity:  [facility] program MWF for 60 minutes, cardio and strength training, getting in 10,000 steps at least 5 days per week  Progress Towards Goal(s):  In progress.    Nutritional Diagnosis:  [place]-3.3 Overweight/obesity related to past poor dietary habits and physical inactivity as evidenced by patient w/ recent RYGB surgery following dietary guidelines for continued weight loss.    Intervention:  Nutrition education/diet reinforcement.  Goals:  Follow Phase 3B: High Protein + Non-Starchy Vegetables  Eat 3-6 small meals/snacks, every 3-5 hrs  Increase lean protein foods to meet 60g goal  Increase fluid intake to 64oz +  Avoid drinking 15 minutes before, during and 30 minutes after eating  Aim for >30 min of physical activity daily  Keep up the good work!  Teaching Method Utilized:  Visual Auditory Hands on  Barriers to learning/adherence to lifestyle change: none  Demonstrated degree of understanding via:  Teach Back   Monitoring/Evaluation:  Dietary intake, exercise, and body weight. Follow up in 12 months for 28  month post-op visit.

## 2014-10-10 ENCOUNTER — Ambulatory Visit (INDEPENDENT_AMBULATORY_CARE_PROVIDER_SITE_OTHER): Payer: Managed Care, Other (non HMO) | Admitting: Family Medicine

## 2014-10-10 ENCOUNTER — Ambulatory Visit (INDEPENDENT_AMBULATORY_CARE_PROVIDER_SITE_OTHER): Payer: Managed Care, Other (non HMO)

## 2014-10-10 DIAGNOSIS — Z23 Encounter for immunization: Secondary | ICD-10-CM | POA: Diagnosis not present

## 2014-10-10 DIAGNOSIS — E039 Hypothyroidism, unspecified: Secondary | ICD-10-CM

## 2014-10-10 LAB — TSH: TSH: 3.66 u[IU]/mL (ref 0.35–4.50)

## 2014-11-20 ENCOUNTER — Other Ambulatory Visit: Payer: Self-pay | Admitting: Family Medicine

## 2014-12-01 ENCOUNTER — Other Ambulatory Visit: Payer: Self-pay | Admitting: Obstetrics and Gynecology

## 2014-12-01 DIAGNOSIS — R928 Other abnormal and inconclusive findings on diagnostic imaging of breast: Secondary | ICD-10-CM

## 2014-12-07 ENCOUNTER — Ambulatory Visit
Admission: RE | Admit: 2014-12-07 | Discharge: 2014-12-07 | Disposition: A | Discharge status: Home / Self Care | Payer: Managed Care, Other (non HMO) | Source: Ambulatory Visit | Attending: Obstetrics and Gynecology | Admitting: Obstetrics and Gynecology

## 2014-12-07 DIAGNOSIS — R928 Other abnormal and inconclusive findings on diagnostic imaging of breast: Secondary | ICD-10-CM

## 2014-12-19 ENCOUNTER — Other Ambulatory Visit: Payer: Self-pay | Admitting: Family Medicine

## 2014-12-23 ENCOUNTER — Other Ambulatory Visit: Payer: Self-pay | Admitting: Family Medicine

## 2015-01-04 ENCOUNTER — Encounter: Payer: Self-pay | Admitting: Family Medicine

## 2015-01-04 ENCOUNTER — Ambulatory Visit (INDEPENDENT_AMBULATORY_CARE_PROVIDER_SITE_OTHER): Payer: Managed Care, Other (non HMO) | Admitting: Family Medicine

## 2015-01-04 VITALS — BP 102/74 | Temp 98.1°F | Wt 186.0 lb

## 2015-01-04 DIAGNOSIS — R1013 Epigastric pain: Secondary | ICD-10-CM | POA: Diagnosis not present

## 2015-01-04 MED ORDER — TRAMADOL HCL 50 MG PO TABS: 50.0000 mg | ORAL_TABLET | Freq: Three times a day (TID) | ORAL | Status: DC | PRN

## 2015-01-04 MED ORDER — OMEPRAZOLE 40 MG PO CPDR: 40.0000 mg | DELAYED_RELEASE_CAPSULE | Freq: Every day | ORAL | Status: DC

## 2015-01-04 NOTE — Patient Instructions (Signed)
Potential ulcer in GI tract- treat with reflux medicine for 4-8 weeks.   Please update me in 3-4 weeks to see how you are doing- if you are having worsening symptoms please let me know earlier.   We will consider either GI referral or Dr. Hassell Done follow up- i will also message him today

## 2015-01-04 NOTE — Progress Notes (Signed)
Garret Reddish, MD  Subjective:  Kara [name] is a 39 y.o. year old very pleasant female patient who presents for/with See problem oriented charting ROS- no unintentional weight loss, no fever, no chills, no nausea or vomiting  Past Medical History-  Patient Active Problem List   Diagnosis Date Noted  . 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 Depression 02/22/2010    Priority: Medium  . OBESITY 06/16/2008    Priority: Medium  . Hyperlipidemia 06/11/2007    Priority: Medium  . Hypothyroidism 08/18/2006    Priority: Medium  . Allergic rhinitis 02/03/2014    Priority: Low  . Vitamin D deficiency 02/03/2014    Priority: Low  . BPPV (benign paroxysmal positional vertigo) 04/19/2013    Priority: Low  . Osteoarthritis of both knees 04/19/2013    Priority: Low  . DJD (degenerative joint disease) 10/26/2009    Priority: Low    Medications- reviewed and updated Current Outpatient Prescriptions  Medication Sig Dispense Refill  . CALCIUM PO Take 1 tablet by mouth 3 (three) times daily.     . cetirizine (ZYRTEC) 10 MG tablet Take 10 mg by mouth daily.    Marland Kitchen levothyroxine (SYNTHROID, LEVOTHROID) 200 MCG tablet TAKE ONE TABLET BY MOUTH ONCE DAILY 30 tablet 5  . medroxyPROGESTERone (DEPO-PROVERA) 150 MG/ML injection Inject 150 mg into the muscle every 3 (three) months. Last dose was 12/10/12    . Misc Natural Products (JOINT SUPPORT PO) Take by mouth.    . Pediatric Multiple Vit-C-FA (FLINSTONES GUMMIES OMEGA-3 DHA) CHEW Chew 1 each by mouth 2 (two) times daily.     Marland Kitchen venlafaxine (EFFEXOR) 37.5 MG tablet TAKE ONE TABLET BY MOUTH TWICE DAILY (Patient taking differently: TAKE ONE TABLET BY MOUTH ONCE DAILY) 180 tablet 3  . vitamin B-12 (CYANOCOBALAMIN) 500 MCG tablet Take 500 mcg by mouth daily.    . diazepam (VALIUM) 2 MG tablet Take 1 tablet (2 mg total) by mouth every 12 (twelve) hours as needed (dizziness / vertigo). (Patient not taking: Reported on 05/23/2014) 15  tablet 0  . omeprazole (PRILOSEC) 40 MG capsule Take 1 capsule (40 mg total) by mouth daily. 30 capsule 1  . ondansetron (ZOFRAN ODT) 4 MG disintegrating tablet Take 1 tablet (4 mg total) by mouth every 8 (eight) hours as needed for nausea or vomiting. (Patient not taking: Reported on 05/23/2014) 30 tablet 0  . traMADol (ULTRAM) 50 MG tablet Take 1 tablet (50 mg total) by mouth 3 (three) times daily as needed. 90 tablet 5   No current facility-administered medications for this visit.    Objective: BP 102/74 mmHg  Temp(Src) 98.1 F (36.7 C)  Wt 186 lb (84.369 kg) Gen: NAD, resting comfortably CV: RRR no murmurs rubs or gallops Lungs: CTAB no crackles, wheeze, rhonchi Abdomen: soft/nontender/nondistended/normal bowel sounds. No rebound or guarding.  Ext: no edema Skin: warm, dry Neuro: grossly normal, moves all extremities, moves slowly at knees due to OA  Assessment/Plan:  Lower sternum/epigastric burning S:1-3 Am wakes her up almost every nght for last 2 months.  Takes pepcid complete chewables and resolves within minutes. Has not had issues like thissince gallbladder was taken out in April 2014. Has tried chewables before bed to see if would prevent it and does not help. Has had a lot of stress-Dealing with estate afterloss of dad. Not progressive-about the same as wehen it started. Moderate burning. No diaphoresis, sweating, nausea, left arm or neck pain with this. Sometimes occurs  in the daytime- if she eats it gets better. Less frequent than nightly symptoms. Tried diet modification with no improvement. No obvious food triggers A/P: If patient had not had gastric bypass, would have strongly favored duodenal ulcer as pain comes up after she has not eaten but she does not have this typical food tract. GERD also less likely with gastric bypass as often used for treatment. No nsaids as potential cause. We will trial PPI prilosec 69m before dinner for next 3-4 weeks. If at least 50% improved  would extend to 2 months, if no improvement get plugged in with GI or Dr. MHassell Doneof bariatric surgery- i will also message him today through epic. If worsening pain (has not over last 2 months), then would seek more immediate feedback from subspecialist.  3-4 week mychart update.  Sooner Return precautions advised.    Meds ordered this encounter  Medications  . traMADol (ULTRAM) 50 MG tablet    Sig: Take 1 tablet (50 mg total) by mouth 3 (three) times daily as needed.    Dispense:  90 tablet    Refill:  5  . omeprazole (PRILOSEC) 40 MG capsule    Sig: Take 1 capsule (40 mg total) by mouth daily.    Dispense:  30 capsule    Refill:  1

## 2015-01-17 ENCOUNTER — Encounter: Payer: Self-pay | Admitting: Family Medicine

## 2015-03-04 ENCOUNTER — Encounter (HOSPITAL_COMMUNITY): Payer: Self-pay

## 2015-03-04 ENCOUNTER — Emergency Department (HOSPITAL_COMMUNITY)
Admission: EM | Admit: 2015-03-04 | Discharge: 2015-03-04 | Disposition: A | Discharge status: Home / Self Care | Payer: Managed Care, Other (non HMO) | Attending: Emergency Medicine | Admitting: Emergency Medicine

## 2015-03-04 ENCOUNTER — Emergency Department (HOSPITAL_COMMUNITY): Payer: Managed Care, Other (non HMO)

## 2015-03-04 DIAGNOSIS — IMO0002 Reserved for concepts with insufficient information to code with codable children: Secondary | ICD-10-CM

## 2015-03-04 DIAGNOSIS — E785 Hyperlipidemia, unspecified: Secondary | ICD-10-CM | POA: Diagnosis not present

## 2015-03-04 DIAGNOSIS — Y998 Other external cause status: Secondary | ICD-10-CM | POA: Insufficient documentation

## 2015-03-04 DIAGNOSIS — E039 Hypothyroidism, unspecified: Secondary | ICD-10-CM | POA: Diagnosis not present

## 2015-03-04 DIAGNOSIS — Z87891 Personal history of nicotine dependence: Secondary | ICD-10-CM | POA: Insufficient documentation

## 2015-03-04 DIAGNOSIS — S82121A Displaced fracture of lateral condyle of right tibia, initial encounter for closed fracture: Secondary | ICD-10-CM | POA: Insufficient documentation

## 2015-03-04 DIAGNOSIS — E669 Obesity, unspecified: Secondary | ICD-10-CM | POA: Diagnosis not present

## 2015-03-04 DIAGNOSIS — F419 Anxiety disorder, unspecified: Secondary | ICD-10-CM | POA: Insufficient documentation

## 2015-03-04 DIAGNOSIS — Z8701 Personal history of pneumonia (recurrent): Secondary | ICD-10-CM | POA: Diagnosis not present

## 2015-03-04 DIAGNOSIS — Y92410 Unspecified street and highway as the place of occurrence of the external cause: Secondary | ICD-10-CM | POA: Diagnosis not present

## 2015-03-04 DIAGNOSIS — F329 Major depressive disorder, single episode, unspecified: Secondary | ICD-10-CM | POA: Insufficient documentation

## 2015-03-04 DIAGNOSIS — S82141A Displaced bicondylar fracture of right tibia, initial encounter for closed fracture: Secondary | ICD-10-CM

## 2015-03-04 DIAGNOSIS — Y9389 Activity, other specified: Secondary | ICD-10-CM | POA: Diagnosis not present

## 2015-03-04 DIAGNOSIS — M170 Bilateral primary osteoarthritis of knee: Secondary | ICD-10-CM | POA: Diagnosis not present

## 2015-03-04 DIAGNOSIS — Z79899 Other long term (current) drug therapy: Secondary | ICD-10-CM | POA: Diagnosis not present

## 2015-03-04 DIAGNOSIS — S8991XA Unspecified injury of right lower leg, initial encounter: Secondary | ICD-10-CM | POA: Diagnosis present

## 2015-03-04 DIAGNOSIS — K219 Gastro-esophageal reflux disease without esophagitis: Secondary | ICD-10-CM | POA: Insufficient documentation

## 2015-03-04 MED ORDER — KETOROLAC TROMETHAMINE 30 MG/ML IJ SOLN
30.0000 mg | Freq: Once | INTRAMUSCULAR | Status: AC
  Administered 2015-03-04: 30 mg via INTRAMUSCULAR
  Filled 2015-03-04: qty 1

## 2015-03-04 MED ORDER — HYDROCODONE-ACETAMINOPHEN 5-325 MG PO TABS: 1.0000 | ORAL_TABLET | Freq: Four times a day (QID) | ORAL | Status: DC | PRN

## 2015-03-04 MED ORDER — MORPHINE SULFATE (PF) 4 MG/ML IV SOLN
4.0000 mg | Freq: Once | INTRAVENOUS | Status: AC
  Administered 2015-03-04: 4 mg via INTRAVENOUS
  Filled 2015-03-04: qty 1

## 2015-03-04 NOTE — ED Notes (Signed)
Pt has been taken to Xray

## 2015-03-04 NOTE — Discharge Instructions (Signed)
Wear knee immobilizer at all times, and use crutches for ALL weight bearing activities. Ice and elevate knee throughout the day. Use norco as directed as needed for pain relief. Do not drive or operate machinery with pain medication use. Follow up with your orthopedist Dr. Sharol Given this week for ongoing management of your knee fracture. Return to the ER for changes or worsening symptoms.    Tibial Fracture, Adult A tibial fracture is a break in the larger bone of your lower leg (tibia). This bone is also called the shin bone. CAUSES   Low-energy injuries, such as a fall from ground level.   High-energy injuries, such as motor vehicle injuries or high-speed sports collisions. RISK FACTORS  Jumping activities.   Repetitive stress, such as long-distance running.   Participation in sports.   Osteoporosis.   Advanced age.  SIGNS AND SYMPTOMS  Pain.   Swelling.   Inability to put weight on your injured leg.   Bone deformities at the site of your injury.   Bruising.  DIAGNOSIS  A tibial fracture can usually be diagnosed using X-rays. TREATMENT  A tibial fracture will often be treated with simple immobilization. A cast or splint will be used on your leg to keep it from moving while it heals. If the injury caused parts of the bone to move out of place, your health care provider may reposition those parts before putting on your cast or splint. The cast or splint will remain in place until your health care provider thinks the bone has healed well enough. Then you can begin range-of-motion exercises to regain your knee motion. For severe injuries, surgery is sometimes needed to insert plates or screws into the injured area. HOME CARE INSTRUCTIONS   If you have a plaster or fiberglass cast:   Do not try to scratch the skin under the cast using sharp or pointed objects.   Check the skin around the cast every day. You may put lotion on any red or sore areas.   Keep your cast  dry and clean.   If you have a plaster splint:   Wear the splint as directed.   Loosen the elastic around the splint if your toes become numb, tingle, or turn cold or blue.   Do not put pressure on any part of your cast or splint until it is fully hardened.   Use a plastic bag to protect your cast or splint during bathing. Do not lower the cast or splint into water.   Use crutches as directed.   Take medicines only as directed by your health care provider.   Keep all follow-up visits as directed by your health care provider. This is important.  SEEK MEDICAL CARE IF:  Your pain is becoming worse rather than better or is not controlled with medicines.   You have increased swelling or redness in your foot.   You begin to lose feeling in your foot or toes.  SEEK IMMEDIATE MEDICAL CARE IF:   Your foot or toes on the injured side feel cold or turn blue.   You develop severe pain in your injured leg, especially if the pain is increased with movement of your toes.  MAKE SURE YOU:  Understand these instructions.   Will watch your condition.   Will get help right away if you are not doing well or get worse.    This information is not intended to replace advice given to you by your health care provider. Make sure you discuss  any questions you have with your health care provider.   Document Released: 10/09/2000 Document Revised: 05/31/2014 Document Reviewed: 03/10/2013 Elsevier Interactive Patient Education 2016 [employer]  Knee Fracture, Adult A knee fracture is a break in a bone of the knee. The break may be in the kneecap (patella), the lower part of the thigh bone (femur), or the upper part of the shin bone (tibia). There are several types of fractures. They include:  Stable. In this type of fracture, the bones of the knee remain in place after the break.  Displaced. In this type of fracture, the bones no longer line up after the break.  Comminuted. In  this type of fracture, the bone breaks into several pieces.  Open. In this type of fracture, the broken bone comes through the skin. CAUSES This injury is usually caused by a fall. This injury can happen because of the impact of the fall or from a violent contraction of the leg muscles before you hit the ground. It can also result from a car accident or a collision with a hard surface. RISK FACTORS This injury is more likely to develop in people who:  Are female.  Are 97-76 years old.  Participate in high-energy sports.  Have a condition that weakens the bones, such as osteoporosis.  Have had a knee replacement. SYMPTOMS Symptoms of this injury include:  Pain.  Swelling.  Bruising.  Inability to bend your knee.  Misshapen knee.  Inability to walk.  Inability to use your injured leg to support your body weight. DIAGNOSIS This injury is diagnosed with a physical exam. Your health care provider may also order:  Imaging studies, such as an X-ray, CT scan, MRI scan, or ultrasound.  A procedure called arthroscopy to view the inside of your knee with a small camera. TREATMENT Treatment for this injury may involve:  Wearing a splint until swelling goes down.  Wearing a cast to keep the fractured bone from moving while it heals. A cast is usually put on after swelling has gone down.  Surgery to move a bone back into place. HOME CARE INSTRUCTIONS If You Have a Splint:  Wear it as directed by your health care provider. Remove it only as directed by your health care provider.  Loosen the splint if your toes become numb and tingle, or if they turn cold and blue.  Do not put pressure on any part of your splint. If You Have a Cast:  Do not stick anything inside the cast to scratch your skin. Doing that increases your risk of infection.  Check the skin around the cast every day. Report any concerns to your health care provider. You may put lotion on dry skin around the edges  of the cast. Do not apply lotion to the skin underneath the cast. Bathing  Cover the cast or splint with a watertight plastic bag to protect it from water while you take a bath or a shower. Do not let the cast or splint get wet. Managing Pain, Stiffness, and Swelling  If directed, apply ice to the injured area:  Put ice in a plastic bag.  Place a towel between your skin and the bag.  Leave the ice on for 20 minutes, 2-3 times a day.  Move your toes often to avoid stiffness and to lessen swelling.  Raise the injured area above the level of your heart while you are lying down. Driving  Do not drive or operate heavy machinery while taking pain medicine.  Do not drive while wearing a cast or splint on a hand or foot that you use for driving. Activity  Return to your normal activities as directed by your health care provider. Ask your health care provider what activities are safe for you. General Instructions  Do not put pressure on any part of the cast or splint until it is fully hardened. This may take several hours.  Keep the cast or splint clean and dry.  Do not use any tobacco products, including cigarettes, chewing tobacco, or electronic cigarettes. Tobacco can delay bone healing. If you need help quitting, ask your health care provider.  Take medicines only as directed by your health care provider.  Keep all follow-up visits as directed by your health care provider. This is important. SEEK MEDICAL CARE IF:  You have knee pain and swelling.  You have trouble walking.  Your cast becomes wet or damaged or suddenly feels too tight. SEEK IMMEDIATE MEDICAL CARE IF:  Your pain and swelling get worse.  You have severe pain below the fracture.  Your skin or toenails turn blue or gray, feel cold, or become numb.  You have fluid, blood, or pus coming from under your cast.   This information is not intended to replace advice given to you by your health care provider. Make  sure you discuss any questions you have with your health care provider.   Document Released: 11/27/2005 Document Revised: 02/04/2014 Document Reviewed: 09/15/2013 Elsevier Interactive Patient Education 2016 [employer].  How to Use a Knee Brace A knee brace is a device that you wear to support your knee, especially if the knee is healing after an injury or surgery. There are several types of knee braces. Some are designed to prevent an injury (prophylactic brace). These are often worn during sports. Others support an injured knee (functional brace) or keep it still while it heals (rehabilitative brace). People with severe arthritis of the knee may benefit from a brace that takes some pressure off the knee (unloader brace). Most knee braces are made from a combination of cloth and metal or plastic.  You may need to wear a knee brace to:  Relieve knee pain.  Help your knee support your weight (improve stability).  Help you walk farther (improve mobility).  Prevent injury.  Support your knee while it heals from surgery or from an injury. RISKS AND COMPLICATIONS Generally, knee braces are very safe to wear. However, problems may occur, including:  Skin irritation that may lead to infection.  Making your condition worse if you wear the brace in the wrong way. HOW TO USE A KNEE BRACE Different braces will have different instructions for use. Your health care provider will tell you or show you:  How to put on your brace.  How to adjust the brace.  When and how often to wear the brace.  How to remove the brace.  If you will need any assistive devices in addition to the brace, such as crutches or a cane. In general, your brace should:  Have the hinge of the brace line up with the bend of your knee.  Have straps, hooks, or tapes that fasten snugly around your leg.  Not feel too tight or too loose. HOW TO CARE FOR A KNEE BRACE  Check your brace often for signs of damage, such as  loose connections or attachments. Your knee brace may get damaged or wear out during normal use.  Wash the fabric parts of your brace with  soap and water.  Read the insert that comes with your brace for other specific care instructions. SEEK MEDICAL CARE IF:  Your knee brace is too loose or too tight and you cannot adjust it.  Your knee brace causes skin redness, swelling, bruising, or irritation.  Your knee brace is not helping.  Your knee brace is making your knee pain worse.   This information is not intended to replace advice given to you by your health care provider. Make sure you discuss any questions you have with your health care provider.   Document Released: 04/06/2003 Document Revised: 10/05/2014 Document Reviewed: 05/09/2014 Elsevier Interactive Patient Education [employer].
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
## 2015-03-04 NOTE — ED Notes (Signed)
PA at the bedside.

## 2015-03-04 NOTE — ED Notes (Signed)
MD at the bedside  
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
## 2015-03-04 NOTE — ED Notes (Signed)
Pt returned from X-ray.  

## 2015-03-04 NOTE — ED Notes (Signed)
Denies pain medicine at this time

## 2015-03-07 ENCOUNTER — Other Ambulatory Visit (HOSPITAL_COMMUNITY): Payer: Self-pay | Admitting: Orthopedic Surgery

## 2015-03-08 ENCOUNTER — Encounter (HOSPITAL_COMMUNITY)
Admission: RE | Admit: 2015-03-08 | Discharge: 2015-03-08 | Disposition: A | Discharge status: Home / Self Care | Payer: Managed Care, Other (non HMO) | Source: Ambulatory Visit | Attending: Orthopedic Surgery | Admitting: Orthopedic Surgery

## 2015-03-08 ENCOUNTER — Encounter (HOSPITAL_COMMUNITY): Payer: Self-pay

## 2015-03-08 DIAGNOSIS — F419 Anxiety disorder, unspecified: Secondary | ICD-10-CM | POA: Diagnosis not present

## 2015-03-08 DIAGNOSIS — Z6831 Body mass index (BMI) 31.0-31.9, adult: Secondary | ICD-10-CM | POA: Diagnosis not present

## 2015-03-08 DIAGNOSIS — X58XXXA Exposure to other specified factors, initial encounter: Secondary | ICD-10-CM | POA: Diagnosis not present

## 2015-03-08 DIAGNOSIS — F329 Major depressive disorder, single episode, unspecified: Secondary | ICD-10-CM | POA: Diagnosis not present

## 2015-03-08 DIAGNOSIS — E785 Hyperlipidemia, unspecified: Secondary | ICD-10-CM | POA: Diagnosis not present

## 2015-03-08 DIAGNOSIS — K219 Gastro-esophageal reflux disease without esophagitis: Secondary | ICD-10-CM | POA: Diagnosis not present

## 2015-03-08 DIAGNOSIS — S82141A Displaced bicondylar fracture of right tibia, initial encounter for closed fracture: Secondary | ICD-10-CM | POA: Diagnosis not present

## 2015-03-08 DIAGNOSIS — Z87891 Personal history of nicotine dependence: Secondary | ICD-10-CM | POA: Diagnosis not present

## 2015-03-08 DIAGNOSIS — Z79899 Other long term (current) drug therapy: Secondary | ICD-10-CM | POA: Diagnosis not present

## 2015-03-08 DIAGNOSIS — E039 Hypothyroidism, unspecified: Secondary | ICD-10-CM | POA: Diagnosis not present

## 2015-03-08 DIAGNOSIS — M150 Primary generalized (osteo)arthritis: Secondary | ICD-10-CM | POA: Diagnosis not present

## 2015-03-08 DIAGNOSIS — E669 Obesity, unspecified: Secondary | ICD-10-CM | POA: Diagnosis not present

## 2015-03-08 LAB — COMPREHENSIVE METABOLIC PANEL
ALT: 20 U/L (ref 14–54)
ANION GAP: 8 (ref 5–15)
AST: 23 U/L (ref 15–41)
Albumin: 3.3 g/dL — ABNORMAL LOW (ref 3.5–5.0)
Alkaline Phosphatase: 89 U/L (ref 38–126)
BILIRUBIN TOTAL: 0.6 mg/dL (ref 0.3–1.2)
BUN: 14 mg/dL (ref 6–20)
CO2: 23 mmol/L (ref 22–32)
Calcium: 8.9 mg/dL (ref 8.9–10.3)
Chloride: 108 mmol/L (ref 101–111)
Creatinine, Ser: 0.7 mg/dL (ref 0.44–1.00)
GLUCOSE: 80 mg/dL (ref 65–99)
POTASSIUM: 4.5 mmol/L (ref 3.5–5.1)
Sodium: 139 mmol/L (ref 135–145)
TOTAL PROTEIN: 6.8 g/dL (ref 6.5–8.1)

## 2015-03-08 LAB — CBC
HEMATOCRIT: 40.6 % (ref 36.0–46.0)
Hemoglobin: 13.3 g/dL (ref 12.0–15.0)
MCH: 28.1 pg (ref 26.0–34.0)
MCHC: 32.8 g/dL (ref 30.0–36.0)
MCV: 85.7 fL (ref 78.0–100.0)
PLATELETS: 347 10*3/uL (ref 150–400)
RBC: 4.74 MIL/uL (ref 3.87–5.11)
RDW: 13.5 % (ref 11.5–15.5)
WBC: 6.8 10*3/uL (ref 4.0–10.5)

## 2015-03-08 LAB — HCG, SERUM, QUALITATIVE: Preg, Serum: NEGATIVE

## 2015-03-08 LAB — PROTIME-INR
INR: 1.09 (ref 0.00–1.49)
PROTHROMBIN TIME: 14.3 s (ref 11.6–15.2)

## 2015-03-08 LAB — APTT: aPTT: 28 seconds (ref 24–37)

## 2015-03-08 NOTE — Pre-Procedure Instructions (Signed)
Jyrah Blye  03/08/2015      [facility] [address] [place], [place] - [address] [place] [address] Phone: [phone] Fax: [phone]  [facility] [address], [place] - [address] [place] [address] Phone: [phone] Fax: [phone]    Your procedure is scheduled on Friday February 10th 2017 at 1200pm.  Report to [facility] at 1000 A.M.  Call this number if you have problems the morning of surgery:  [phone]   Remember:  Do not eat food or drink liquids after midnight Thursday February 9th 2017.  Take these medicines the morning of surgery with A SIP OF WATER percocet (oxycodone-acetaminophen) if needed, levothyroxine (synthroid/levothroid), zofran as needed, tramadol if needed, effexor if needed  STOP: ALL Vitamins, Supplements, Effient and Herbal Medications, Fish Oils, Aspirins, NSAIDs (Nonsteroidal Anti-inflammatories such as Ibuprofen, Aleve, or Advil), and Goody's/BC Powders today.   Do not wear jewelry, make-up or nail polish.  Do not wear lotions, powders, or perfumes.  You may wear deodorant.  Do not shave 48 hours prior to surgery.    Do not bring valuables to the hospital.  [facility] is not responsible for any belongings or valuables.  Contacts, dentures or bridgework may not be worn into surgery.  Leave your suitcase in the car.  After surgery it may be brought to your room.  For patients admitted to the hospital, discharge time will be determined by your treatment team.  Patients discharged the day of surgery will not be allowed to drive home.   Please read over the following fact sheets that you were given. Pain Booklet, Coughing and Deep Breathing and Surgical Site Infection Prevention        Preparing for Surgery at [facility]  Before surgery, you can play an important role.  Because skin is not sterile, your skin needs to be  as free of germs as possible.  You can reduce the number of germs on your skin by washing with CHG (chlorahexidine gluconate) Soap before surgery.  CHG is an antiseptic cleaner with kills germs and bonds with the skin to continue killing germs even after washing.   Please do not use if you have an allergy to CHG or antibacterial soaps.  If your skin becomes reddened/irritated stop using the CHG.  Do not shave (including legs and underarms) for at least 48 hours prior to first CHG shower.  It is okay to shave your face.  Please follow these instructions carefully:  1. Shower with CHG Soap the night before surgery and the morning of Surgery. 2. If you choose to wash your hair, wash your hair first as usual with your normal shampoo. 3. After you shampoo, rinse your hair and body thoroughly to remove the Shampoo. 4. Use CHG as you would any other liquid soap. You can apply chg directly to the skin and wash gently with scrungie or a clean washcloth. 5. Apply the CHG Soap to your body ONLY FROM THE NECK DOWN. Do not use on open wounds or open sores. Avoid contact with your eyes, ears, mouth and genitals (private parts). Wash genitals (private parts) with your normal soap. 6. Wash thoroughly, paying special attention to the area where your surgery will be performed. 7. Thoroughly rinse your body with warm water from the neck down. 8. DO NOT shower/wash with your normal soap after using and rinsing off the CHG Soap. 9. Pat yourself dry with  a clean towel.  10. Wear clean pajamas.  11. Place clean sheets on your bed the night of your first shower and do not sleep with pets.  Day of Surgery  Do not apply any lotions/deodorants the morning of surgery. Please wear clean clothes to the hospital/surgery center.

## 2015-03-09 MED ORDER — CHLORHEXIDINE GLUCONATE 4 % EX LIQD: 60.0000 mL | Freq: Once | CUTANEOUS | Status: DC

## 2015-03-09 MED ORDER — CEFAZOLIN SODIUM-DEXTROSE 2-3 GM-% IV SOLR
2.0000 g | INTRAVENOUS | Status: AC
  Administered 2015-03-10: 2 g via INTRAVENOUS
  Filled 2015-03-09: qty 50

## 2015-03-10 ENCOUNTER — Encounter (HOSPITAL_COMMUNITY): Payer: Self-pay | Admitting: *Deleted

## 2015-03-10 ENCOUNTER — Ambulatory Visit (HOSPITAL_COMMUNITY): Payer: Managed Care, Other (non HMO)

## 2015-03-10 ENCOUNTER — Ambulatory Visit (HOSPITAL_COMMUNITY): Payer: Managed Care, Other (non HMO) | Admitting: Anesthesiology

## 2015-03-10 ENCOUNTER — Encounter (HOSPITAL_COMMUNITY)
Admission: RE | Disposition: A | Discharge status: Home Health | Payer: Self-pay | Source: Ambulatory Visit | Attending: Orthopedic Surgery

## 2015-03-10 ENCOUNTER — Ambulatory Visit (HOSPITAL_COMMUNITY)
Admission: RE | Admit: 2015-03-10 | Discharge: 2015-03-11 | Disposition: A | Discharge status: Home Health | Payer: Managed Care, Other (non HMO) | Source: Ambulatory Visit | Attending: Orthopedic Surgery | Admitting: Orthopedic Surgery

## 2015-03-10 DIAGNOSIS — E785 Hyperlipidemia, unspecified: Secondary | ICD-10-CM | POA: Insufficient documentation

## 2015-03-10 DIAGNOSIS — E039 Hypothyroidism, unspecified: Secondary | ICD-10-CM | POA: Insufficient documentation

## 2015-03-10 DIAGNOSIS — Z6831 Body mass index (BMI) 31.0-31.9, adult: Secondary | ICD-10-CM | POA: Insufficient documentation

## 2015-03-10 DIAGNOSIS — Z79899 Other long term (current) drug therapy: Secondary | ICD-10-CM | POA: Insufficient documentation

## 2015-03-10 DIAGNOSIS — F419 Anxiety disorder, unspecified: Secondary | ICD-10-CM | POA: Insufficient documentation

## 2015-03-10 DIAGNOSIS — M150 Primary generalized (osteo)arthritis: Secondary | ICD-10-CM | POA: Insufficient documentation

## 2015-03-10 DIAGNOSIS — S82143A Displaced bicondylar fracture of unspecified tibia, initial encounter for closed fracture: Secondary | ICD-10-CM | POA: Diagnosis present

## 2015-03-10 DIAGNOSIS — X58XXXA Exposure to other specified factors, initial encounter: Secondary | ICD-10-CM | POA: Insufficient documentation

## 2015-03-10 DIAGNOSIS — Z87891 Personal history of nicotine dependence: Secondary | ICD-10-CM | POA: Insufficient documentation

## 2015-03-10 DIAGNOSIS — K219 Gastro-esophageal reflux disease without esophagitis: Secondary | ICD-10-CM | POA: Insufficient documentation

## 2015-03-10 DIAGNOSIS — S82141A Displaced bicondylar fracture of right tibia, initial encounter for closed fracture: Secondary | ICD-10-CM | POA: Insufficient documentation

## 2015-03-10 DIAGNOSIS — Z419 Encounter for procedure for purposes other than remedying health state, unspecified: Secondary | ICD-10-CM

## 2015-03-10 DIAGNOSIS — E669 Obesity, unspecified: Secondary | ICD-10-CM | POA: Insufficient documentation

## 2015-03-10 DIAGNOSIS — F329 Major depressive disorder, single episode, unspecified: Secondary | ICD-10-CM | POA: Insufficient documentation

## 2015-03-10 HISTORY — PX: ORIF TIBIA PLATEAU: SHX2132

## 2015-03-10 SURGERY — OPEN REDUCTION INTERNAL FIXATION (ORIF) TIBIAL PLATEAU
Anesthesia: General | Site: Knee | Laterality: Right

## 2015-03-10 MED ORDER — LACTATED RINGERS IV SOLN
INTRAVENOUS | Status: DC
  Administered 2015-03-10: 11:00:00 via INTRAVENOUS

## 2015-03-10 MED ORDER — MIDAZOLAM HCL 5 MG/5ML IJ SOLN
INTRAMUSCULAR | Status: DC | PRN
  Administered 2015-03-10: 2 mg via INTRAVENOUS

## 2015-03-10 MED ORDER — METOCLOPRAMIDE HCL 5 MG PO TABS: 5.0000 mg | ORAL_TABLET | Freq: Three times a day (TID) | ORAL | Status: DC | PRN

## 2015-03-10 MED ORDER — SUGAMMADEX SODIUM 500 MG/5ML IV SOLN
INTRAVENOUS | Status: DC | PRN
  Administered 2015-03-10: 360 mg via INTRAVENOUS

## 2015-03-10 MED ORDER — PROMETHAZINE HCL 25 MG/ML IJ SOLN
6.2500 mg | INTRAMUSCULAR | Status: DC | PRN
Start: 2015-03-10 — End: 2015-03-10

## 2015-03-10 MED ORDER — ONDANSETRON HCL 4 MG/2ML IJ SOLN: 4.0000 mg | Freq: Four times a day (QID) | INTRAMUSCULAR | Status: DC | PRN

## 2015-03-10 MED ORDER — PROPOFOL 10 MG/ML IV BOLUS
INTRAVENOUS | Status: DC | PRN
  Administered 2015-03-10: 150 mg via INTRAVENOUS

## 2015-03-10 MED ORDER — ONDANSETRON HCL 4 MG PO TABS: 4.0000 mg | ORAL_TABLET | Freq: Four times a day (QID) | ORAL | Status: DC | PRN

## 2015-03-10 MED ORDER — SODIUM CHLORIDE 0.9 % IV SOLN
INTRAVENOUS | Status: DC
  Administered 2015-03-10 – 2015-03-11 (×2): via INTRAVENOUS

## 2015-03-10 MED ORDER — HYDROMORPHONE HCL 1 MG/ML IJ SOLN
0.2500 mg | INTRAMUSCULAR | Status: DC | PRN
  Administered 2015-03-10 (×2): 0.5 mg via INTRAVENOUS

## 2015-03-10 MED ORDER — FENTANYL CITRATE (PF) 100 MCG/2ML IJ SOLN
INTRAMUSCULAR | Status: DC | PRN
  Administered 2015-03-10: 50 ug via INTRAVENOUS
  Administered 2015-03-10 (×2): 100 ug via INTRAVENOUS

## 2015-03-10 MED ORDER — PANTOPRAZOLE SODIUM 40 MG PO TBEC
40.0000 mg | DELAYED_RELEASE_TABLET | Freq: Every day | ORAL | Status: DC
  Administered 2015-03-11: 40 mg via ORAL
  Filled 2015-03-10: qty 1

## 2015-03-10 MED ORDER — SCOPOLAMINE 1 MG/3DAYS TD PT72
MEDICATED_PATCH | TRANSDERMAL | Status: DC | PRN
  Administered 2015-03-10: 1 via TRANSDERMAL

## 2015-03-10 MED ORDER — HYDROMORPHONE HCL 1 MG/ML IJ SOLN: 1.0000 mg | INTRAMUSCULAR | Status: DC | PRN

## 2015-03-10 MED ORDER — SUGAMMADEX SODIUM 500 MG/5ML IV SOLN
INTRAVENOUS | Status: AC
  Filled 2015-03-10: qty 5

## 2015-03-10 MED ORDER — MIDAZOLAM HCL 2 MG/2ML IJ SOLN
INTRAMUSCULAR | Status: AC
  Filled 2015-03-10: qty 2

## 2015-03-10 MED ORDER — OXYCODONE HCL 5 MG PO TABS
5.0000 mg | ORAL_TABLET | ORAL | Status: DC | PRN
  Administered 2015-03-10 – 2015-03-11 (×3): 10 mg via ORAL
  Filled 2015-03-10 (×3): qty 2

## 2015-03-10 MED ORDER — DEXAMETHASONE SODIUM PHOSPHATE 4 MG/ML IJ SOLN
INTRAMUSCULAR | Status: DC | PRN
  Administered 2015-03-10: 4 mg via INTRAVENOUS

## 2015-03-10 MED ORDER — ONDANSETRON HCL 4 MG/2ML IJ SOLN
INTRAMUSCULAR | Status: DC | PRN
  Administered 2015-03-10: 4 mg via INTRAVENOUS

## 2015-03-10 MED ORDER — ONDANSETRON HCL 4 MG/2ML IJ SOLN
INTRAMUSCULAR | Status: AC
  Filled 2015-03-10: qty 2

## 2015-03-10 MED ORDER — LEVOTHYROXINE SODIUM 100 MCG PO TABS
200.0000 ug | ORAL_TABLET | Freq: Every day | ORAL | Status: DC
  Administered 2015-03-11: 200 ug via ORAL
  Filled 2015-03-10: qty 2

## 2015-03-10 MED ORDER — LIDOCAINE HCL (CARDIAC) 20 MG/ML IV SOLN
INTRAVENOUS | Status: DC | PRN
  Administered 2015-03-10: 60 mg via INTRAVENOUS

## 2015-03-10 MED ORDER — ACETAMINOPHEN 325 MG PO TABS: 650.0000 mg | ORAL_TABLET | Freq: Four times a day (QID) | ORAL | Status: DC | PRN

## 2015-03-10 MED ORDER — ROCURONIUM BROMIDE 100 MG/10ML IV SOLN
INTRAVENOUS | Status: DC | PRN
  Administered 2015-03-10: 50 mg via INTRAVENOUS

## 2015-03-10 MED ORDER — LIDOCAINE HCL (CARDIAC) 20 MG/ML IV SOLN
INTRAVENOUS | Status: AC
  Filled 2015-03-10: qty 5

## 2015-03-10 MED ORDER — DEXAMETHASONE SODIUM PHOSPHATE 4 MG/ML IJ SOLN
INTRAMUSCULAR | Status: AC
  Filled 2015-03-10: qty 1

## 2015-03-10 MED ORDER — FENTANYL CITRATE (PF) 250 MCG/5ML IJ SOLN
INTRAMUSCULAR | Status: AC
  Filled 2015-03-10: qty 5

## 2015-03-10 MED ORDER — ACETAMINOPHEN 650 MG RE SUPP: 650.0000 mg | Freq: Four times a day (QID) | RECTAL | Status: DC | PRN

## 2015-03-10 MED ORDER — HYDROMORPHONE HCL 1 MG/ML IJ SOLN
INTRAMUSCULAR | Status: AC
  Filled 2015-03-10: qty 1

## 2015-03-10 MED ORDER — 0.9 % SODIUM CHLORIDE (POUR BTL) OPTIME
TOPICAL | Status: DC | PRN
  Administered 2015-03-10: 1000 mL

## 2015-03-10 MED ORDER — METHOCARBAMOL 500 MG PO TABS
500.0000 mg | ORAL_TABLET | Freq: Four times a day (QID) | ORAL | Status: DC | PRN
  Administered 2015-03-10 – 2015-03-11 (×3): 500 mg via ORAL
  Filled 2015-03-10 (×3): qty 1

## 2015-03-10 MED ORDER — CEFAZOLIN SODIUM 1-5 GM-% IV SOLN
1.0000 g | Freq: Four times a day (QID) | INTRAVENOUS | Status: AC
Start: 2015-03-10 — End: 2015-03-11
  Administered 2015-03-10 – 2015-03-11 (×3): 1 g via INTRAVENOUS
  Filled 2015-03-10 (×3): qty 50

## 2015-03-10 MED ORDER — RIVAROXABAN 10 MG PO TABS
10.0000 mg | ORAL_TABLET | ORAL | Status: DC
  Administered 2015-03-11: 10 mg via ORAL
  Filled 2015-03-10: qty 1

## 2015-03-10 MED ORDER — DEXTROSE 5 % IV SOLN: 500.0000 mg | Freq: Four times a day (QID) | INTRAVENOUS | Status: DC | PRN

## 2015-03-10 MED ORDER — VENLAFAXINE HCL 37.5 MG PO TABS
37.5000 mg | ORAL_TABLET | Freq: Two times a day (BID) | ORAL | Status: DC
  Administered 2015-03-10 – 2015-03-11 (×2): 37.5 mg via ORAL
  Filled 2015-03-10 (×3): qty 1

## 2015-03-10 MED ORDER — ROCURONIUM BROMIDE 50 MG/5ML IV SOLN
INTRAVENOUS | Status: AC
  Filled 2015-03-10: qty 1

## 2015-03-10 MED ORDER — PROPOFOL 10 MG/ML IV BOLUS
INTRAVENOUS | Status: AC
  Filled 2015-03-10: qty 20

## 2015-03-10 MED ORDER — METOCLOPRAMIDE HCL 5 MG/ML IJ SOLN: 5.0000 mg | Freq: Three times a day (TID) | INTRAMUSCULAR | Status: DC | PRN

## 2015-03-10 SURGICAL SUPPLY — 52 items
BLADE SURG 10 STRL SS (BLADE) ×2 IMPLANT
BLADE SURG ROTATE 9660 (MISCELLANEOUS) IMPLANT
BNDG COHESIVE 6X5 TAN STRL LF (GAUZE/BANDAGES/DRESSINGS) ×4 IMPLANT
BNDG GAUZE ELAST 4 BULKY (GAUZE/BANDAGES/DRESSINGS) ×2 IMPLANT
CLEANER TIP ELECTROSURG 2X2 (MISCELLANEOUS) ×2 IMPLANT
COVER MAYO STAND STRL (DRAPES) ×2 IMPLANT
COVER SURGICAL LIGHT HANDLE (MISCELLANEOUS) ×2 IMPLANT
CUFF TOURNIQUET SINGLE 34IN LL (TOURNIQUET CUFF) IMPLANT
CUFF TOURNIQUET SINGLE 44IN (TOURNIQUET CUFF) IMPLANT
DRAPE C-ARM 42X72 X-RAY (DRAPES) IMPLANT
DRAPE INCISE IOBAN 66X45 STRL (DRAPES) ×2 IMPLANT
DRAPE U-SHAPE 47X51 STRL (DRAPES) ×2 IMPLANT
DRSG ADAPTIC 3X8 NADH LF (GAUZE/BANDAGES/DRESSINGS) ×2 IMPLANT
DRSG MEPILEX BORDER 4X4 (GAUZE/BANDAGES/DRESSINGS) ×2 IMPLANT
DRSG PAD ABDOMINAL 8X10 ST (GAUZE/BANDAGES/DRESSINGS) ×4 IMPLANT
DURAPREP 26ML APPLICATOR (WOUND CARE) ×2 IMPLANT
ELECT REM PT RETURN 9FT ADLT (ELECTROSURGICAL) ×2
ELECTRODE REM PT RTRN 9FT ADLT (ELECTROSURGICAL) ×1 IMPLANT
EVACUATOR 1/8 PVC DRAIN (DRAIN) IMPLANT
GAUZE SPONGE 4X4 12PLY STRL (GAUZE/BANDAGES/DRESSINGS) ×4 IMPLANT
GLOVE BIOGEL PI IND STRL 9 (GLOVE) ×1 IMPLANT
GLOVE BIOGEL PI INDICATOR 9 (GLOVE) ×1
GLOVE SURG ORTHO 9.0 STRL STRW (GLOVE) ×2 IMPLANT
GOWN STRL REUS W/ TWL XL LVL3 (GOWN DISPOSABLE) ×3 IMPLANT
GOWN STRL REUS W/TWL XL LVL3 (GOWN DISPOSABLE) ×3
GUIDEWIRE THREADED 2.8 (WIRE) ×6 IMPLANT
KIT BASIN OR (CUSTOM PROCEDURE TRAY) ×2 IMPLANT
KIT ROOM TURNOVER OR (KITS) ×2 IMPLANT
MANIFOLD NEPTUNE II (INSTRUMENTS) ×2 IMPLANT
NEEDLE 22X1 1/2 (OR ONLY) (NEEDLE) ×2 IMPLANT
NS IRRIG 1000ML POUR BTL (IV SOLUTION) ×2 IMPLANT
PACK ORTHO EXTREMITY (CUSTOM PROCEDURE TRAY) ×2 IMPLANT
PAD ARMBOARD 7.5X6 YLW CONV (MISCELLANEOUS) ×4 IMPLANT
SCREW CANN 32 THRD/50 7.3 (Screw) ×2 IMPLANT
SCREW CANN 32 THRD/65 7.3 (Screw) ×2 IMPLANT
SCREW CANN 32 THRD/70 7.3 (Screw) ×2 IMPLANT
SPONGE LAP 18X18 X RAY DECT (DISPOSABLE) ×2 IMPLANT
STAPLER VISISTAT 35W (STAPLE) IMPLANT
STOCKINETTE IMPERVIOUS LG (DRAPES) ×2 IMPLANT
SUCTION FRAZIER HANDLE 10FR (MISCELLANEOUS) ×1
SUCTION TUBE FRAZIER 10FR DISP (MISCELLANEOUS) ×1 IMPLANT
SUT ETHILON 2 0 PSLX (SUTURE) IMPLANT
SUT ETHILON 4 0 PS 2 18 (SUTURE) IMPLANT
SUT VIC AB 0 CTB1 27 (SUTURE) ×2 IMPLANT
SUT VIC AB 1 CTB1 27 (SUTURE) ×2 IMPLANT
SUT VIC AB 2-0 CTB1 (SUTURE) ×2 IMPLANT
SYR 20ML ECCENTRIC (SYRINGE) ×2 IMPLANT
TOWEL OR 17X24 6PK STRL BLUE (TOWEL DISPOSABLE) ×2 IMPLANT
TOWEL OR 17X26 10 PK STRL BLUE (TOWEL DISPOSABLE) ×2 IMPLANT
TUBE CONNECTING 12X1/4 (SUCTIONS) ×2 IMPLANT
WATER STERILE IRR 1000ML POUR (IV SOLUTION) ×4 IMPLANT
YANKAUER SUCT BULB TIP NO VENT (SUCTIONS) ×2 IMPLANT

## 2015-03-10 NOTE — Op Note (Signed)
03/10/2015  12:51 PM  PATIENT:  Kara [name]    PRE-OPERATIVE DIAGNOSIS:  Right Lateral Tibial Plateau Fracture  POST-OPERATIVE DIAGNOSIS:  Same  PROCEDURE:  OPEN REDUCTION INTERNAL FIXATION (ORIF) LATERAL TIBIAL PLATEAU  SURGEON:  [name], MD  PHYSICIAN ASSISTANT:None ANESTHESIA:   General  PREOPERATIVE INDICATIONS:  Kara [name] is a  40 y.o. female with a diagnosis of Right Lateral Tibial Plateau Fracture who failed conservative measures and elected for surgical management.    The risks benefits and alternatives were discussed with the patient preoperatively including but not limited to the risks of infection, bleeding, nerve injury, cardiopulmonary complications, the need for revision surgery, among others, and the patient was willing to proceed.  OPERATIVE IMPLANTS: Synthes 7.3 cannulated screws 3  OPERATIVE FINDINGS: Osteoarthritis right knee with a split lateral tibial plateau fracture without joint depression  OPERATIVE PROCEDURE: Patient was brought to the operating room and underwent a general anesthetic. After adequate anesthesia were obtained patient's right lower extremity was prepped using DuraPrep draped into a sterile field. A timeout was called. A lateral incision was made over the tibial plateau closed reduction was used to perform reduction of the tibial plateau fracture. Cannulated screws 3 were then placed over wire for reduction of the tibial plateau C-arm floss be verified alignment. The wound was irrigated with normal saline incision was closed using 2-0 nylon. A sterile compressive dressing was applied. Patient was extubated taken to PACU in stable condition.  Plan for overnight observation patient does not need further prescriptions for pain medication.

## 2015-03-10 NOTE — H&P (Signed)
Kara [name] is an 40 y.o. female.   Chief Complaint: Right lateral tibial plateau fracture HPI: Patient is a 40 year old woman status post right tibial plateau fracture. Radiographs shows a split without joint depression with severe tricompartmental osteoarthritis.  Past Medical History  Diagnosis Date  . ANXIETY 08/18/2006  . 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 DEPRESSION 02/22/2010  . HYPERLIPIDEMIA 06/11/2007  . HYPOTHYROIDISM 08/18/2006  . OBESITY 06/16/2008  . GERD (gastroesophageal reflux disease)   . Pneumonia     hx of   . PONV (postoperative nausea and vomiting)     used Scopolamine patch    Past Surgical History  Procedure Laterality Date  . Orif ulnar fracture Right 1995    plates and pins  . Cholecystectomy N/A 05/08/2012    Procedure: LAPAROSCOPIC CHOLECYSTECTOMY WITH INTRAOPERATIVE CHOLANGIOGRAM;  Surgeon: Leighton Ruff, MD;  Location: [facility];  Service: General;  Laterality: N/A;  . Knee arthroscopy Right 07/28/12    dr. Sharol Given  . 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 Upper gi endoscopy  12/29/2012    Procedure: UPPER GI ENDOSCOPY;  Surgeon: Madilyn Hook, DO;  Location: [facility];  Service: General;;  . Hernia repair    . Fracture surgery      Family History  Problem Relation Age of Onset  . Schizophrenia Mother   . Cancer Mother     lung, smoker  . Heart disease Father 10    smoker at time  . Breast cancer Mother 74  . Uterine cancer Mother    Social History:  reports that she quit smoking about 10 years ago. Her smoking use included Cigarettes. She has never used smokeless tobacco. She reports that she drinks alcohol. She reports that she does not use illicit drugs.  Allergies:  Allergies  Allergen Reactions  . Asa [Aspirin] Itching and Swelling    No prescriptions prior to admission    Results for orders  placed or performed during the hospital encounter of 03/08/15 (from the past 48 hour(s))  hCG, serum, qualitative     Status: None   Collection Time: 03/08/15 12:22 PM  Result Value Ref Range   Preg, Serum NEGATIVE NEGATIVE    Comment:        THE SENSITIVITY OF THIS METHODOLOGY IS >10 mIU/mL.   APTT     Status: None   Collection Time: 03/08/15 12:22 PM  Result Value Ref Range   aPTT 28 24 - 37 seconds  CBC     Status: None   Collection Time: 03/08/15 12:22 PM  Result Value Ref Range   WBC 6.8 4.0 - 10.5 K/uL   RBC 4.74 3.87 - 5.11 MIL/uL   Hemoglobin 13.3 12.0 - 15.0 g/dL   HCT 40.6 36.0 - 46.0 %   MCV 85.7 78.0 - 100.0 fL   MCH 28.1 26.0 - 34.0 pg   MCHC 32.8 30.0 - 36.0 g/dL   RDW 13.5 11.5 - 15.5 %   Platelets 347 150 - 400 K/uL  Comprehensive metabolic panel     Status: Abnormal   Collection Time: 03/08/15 12:22 PM  Result Value Ref Range   Sodium 139 135 - 145 mmol/L   Potassium 4.5 3.5 - 5.1 mmol/L   Chloride 108 101 - 111 mmol/L   CO2 23 22 - 32 mmol/L   Glucose, Bld 80 65 - 99 mg/dL  BUN 14 6 - 20 mg/dL   Creatinine, Ser 0.70 0.44 - 1.00 mg/dL   Calcium 8.9 8.9 - 10.3 mg/dL   Total Protein 6.8 6.5 - 8.1 g/dL   Albumin 3.3 (L) 3.5 - 5.0 g/dL   AST 23 15 - 41 U/L   ALT 20 14 - 54 U/L   Alkaline Phosphatase 89 38 - 126 U/L   Total Bilirubin 0.6 0.3 - 1.2 mg/dL   GFR calc non Af Amer >60 >60 mL/min   GFR calc Af Amer >60 >60 mL/min    Comment: (NOTE) The eGFR has been calculated using the CKD EPI equation. This calculation has not been validated in all clinical situations. eGFR's persistently <60 mL/min signify possible Chronic Kidney Disease.    Anion gap 8 5 - 15  Protime-INR     Status: None   Collection Time: 03/08/15 12:22 PM  Result Value Ref Range   Prothrombin Time 14.3 11.6 - 15.2 seconds   INR 1.09 0.00 - 1.49   No results found.  Review of Systems  All other systems reviewed and are negative.   There were no vitals taken for this  visit. Physical Exam  On examination patient's skin is intact. Radiograph shows a split lateral tibial plateau fracture without joint depression with severe tricompartmental arthritis. Assessment/Plan Assessment: Right lateral tibial plateau fracture without joint depression with severe tricompartmental osteoarthritis.  Plan: We will plan for open reduction internal fixation of the tibial plateau fracture risk and benefits were discussed patient states she understands wishes to proceed at this time patient states that she would like to proceed with a total knee arthroplasty once the bone is healed.  [name], MD 03/10/2015, 7:10 AM

## 2015-03-10 NOTE — Anesthesia Procedure Notes (Signed)
Procedure Name: Intubation Date/Time: 03/10/2015 12:23 PM Performed by: Melina Copa, Koa Palla R Pre-anesthesia Checklist: Patient identified, Emergency Drugs available, Suction available, Patient being monitored and Timeout performed Patient Re-evaluated:Patient Re-evaluated prior to inductionOxygen Delivery Method: Circle system utilized Preoxygenation: Pre-oxygenation with 100% oxygen Intubation Type: IV induction Ventilation: Mask ventilation without difficulty Laryngoscope Size: Mac and 3 Grade View: Grade I Tube type: Oral Tube size: 7.5 mm Number of attempts: 1 Airway Equipment and Method: Stylet Placement Confirmation: ETT inserted through vocal cords under direct vision,  positive ETCO2 and breath sounds checked- equal and bilateral Secured at: 22 cm Tube secured with: Tape Dental Injury: Teeth and Oropharynx as per pre-operative assessment

## 2015-03-10 NOTE — Progress Notes (Signed)
Pt arrives to the unit from PACU in bed with family at bedside. Pt A&O x4; IV intact and transfusing; RLE incision dsg clean, dry and intact. No stain or active bleeding noted; VSS; neuro check wnl; Pt oriented to the unit and room; call light within reach. Pt in bed comfortably with family at bedside. Will closely monitor pt. PDarden Palmer Valeriano Bain Rn

## 2015-03-10 NOTE — Transfer of Care (Signed)
Immediate Anesthesia Transfer of Care Note  Patient: Kara [name]  Procedure(s) Performed: Procedure(s): OPEN REDUCTION INTERNAL FIXATION (ORIF) LATERAL TIBIAL PLATEAU (Right)  Patient Location: PACU  Anesthesia Type:General  Level of Consciousness: awake, oriented and patient cooperative  Airway & Oxygen Therapy: Patient Spontanous Breathing and Patient connected to nasal cannula oxygen  Post-op Assessment: Report given to RN, Post -op Vital signs reviewed and stable and Patient moving all extremities  Post vital signs: Reviewed and stable  Last Vitals:  Filed Vitals:   03/10/15 1018 03/10/15 1314  BP: 119/81 134/56  Pulse: 85 93  Temp: 37.2 C 36.7 C  Resp: 18 20    Complications: No apparent anesthesia complications

## 2015-03-10 NOTE — Anesthesia Preprocedure Evaluation (Signed)
Anesthesia Evaluation  Patient identified by MRN, date of birth, ID band Patient awake    Reviewed: Allergy & Precautions, NPO status , Patient's Chart, lab work & pertinent test results  Airway Mallampati: II  TM Distance: >3 FB Neck ROM: Full    Dental no notable dental hx.    Pulmonary neg pulmonary ROS, former smoker,    Pulmonary exam normal breath sounds clear to auscultation       Cardiovascular negative cardio ROS Normal cardiovascular exam Rhythm:Regular Rate:Normal     Neuro/Psych negative neurological ROS  negative psych ROS   GI/Hepatic Neg liver ROS, GERD  Medicated,  Endo/Other  Hypothyroidism   Renal/GU negative Renal ROS  negative genitourinary   Musculoskeletal negative musculoskeletal ROS (+)   Abdominal   Peds negative pediatric ROS (+)  Hematology negative hematology ROS (+)   Anesthesia Other Findings   Reproductive/Obstetrics negative OB ROS                             Anesthesia Physical Anesthesia Plan  ASA: II  Anesthesia Plan: General   Post-op Pain Management:    Induction: Intravenous  Airway Management Planned: Oral ETT  Additional Equipment:   Intra-op Plan:   Post-operative Plan: Extubation in OR  Informed Consent: I have reviewed the patients History and Physical, chart, labs and discussed the procedure including the risks, benefits and alternatives for the proposed anesthesia with the patient or authorized representative who has indicated his/her understanding and acceptance.   Dental advisory given  Plan Discussed with: CRNA and Surgeon  Anesthesia Plan Comments:         Anesthesia Quick Evaluation

## 2015-03-10 NOTE — Evaluation (Signed)
Physical Therapy Evaluation Patient Details Name: Kara [name] MRN: [id number] DOB: [birth year] Today's Date: 03/10/2015   History of Present Illness  Patient is a 40 year old woman status post right tibial plateau fracture now s/p ORIF.  PMH positive for gastric bypass, R knee scope and GERD.  Reports once fracture stabilized/healed plans to have TKA.  Clinical Impression  Patient presents with decreased independence with mobility due to deficits listed in PT problem list.  She will benefit from skilled PT in the acute setting to allow return home with intermittent family assist.  Would likely benefit from HHPT for home safety and to ensure follow through with HEP.   Follow Up Recommendations Home health PT    Equipment Recommendations  None recommended by PT    Recommendations for Other Services       Precautions / Restrictions Precautions Precautions: Fall Required Braces or Orthoses: Knee Immobilizer - Right Knee Immobilizer - Right: On when out of bed or walking Restrictions Weight Bearing Restrictions: Yes RLE Weight Bearing: Touchdown weight bearing      Mobility  Bed Mobility Overal bed mobility: Needs Assistance Bed Mobility: Supine to Sit     Supine to sit: Supervision;Min guard;HOB elevated        Transfers Overall transfer level: Needs assistance Equipment used: Rolling walker (2 wheeled) Transfers: Sit to/from Stand Sit to Stand: Min assist         General transfer comment: for stabilizing  Ambulation/Gait Ambulation/Gait assistance: Min guard;Supervision Ambulation Distance (Feet): 12 Feet (x 2) Assistive device: Rolling walker (2 wheeled) Gait Pattern/deviations: Step-to pattern;Decreased stride length     General Gait Details: keeping R LE NWB, hopping at times, assist for safety  Stairs            Wheelchair Mobility    Modified Rankin (Stroke Patients Only)       Balance Overall balance assessment: Needs assistance          Standing balance support: During functional activity;No upper extremity supported Standing balance-Leahy Scale: Poor Standing balance comment: bracing against sink with trunk while hand washing                             Pertinent Vitals/Pain Pain Assessment: 0-10 Pain Score: 3  Pain Location: R knee Pain Descriptors / Indicators: Throbbing Pain Intervention(s): Limited activity within patient's tolerance    Home Living Family/patient expects to be discharged to:: Private residence Living Arrangements: Spouse/significant other Available Help at Discharge: Family;Available PRN/intermittently Type of Home: House Home Access: Stairs to enter   Entrance Stairs-Number of Steps: 4 Home Layout: One level Home Equipment: Shower seat      Prior Function Level of Independence: Independent with assistive device(s);Needs assistance   Gait / Transfers Assistance Needed: managee with crutches indepednent  ADL's / Homemaking Assistance Needed: assist for showering        Hand Dominance        Extremity/Trunk Assessment               Lower Extremity Assessment: RLE deficits/detail RLE Deficits / Details: Ankle AROM WFL, lifts leg with knee extension lag, able to do quad sets and notes maybe 20 degrees AROM knee flexion       Communication   Communication: No difficulties  Cognition Arousal/Alertness: Awake/alert Behavior During Therapy: WFL for tasks assessed/performed Overall Cognitive Status: Within Functional Limits for tasks assessed  General Comments      Exercises Total Joint Exercises Ankle Circles/Pumps: AROM;Both;5 reps;Supine Quad Sets: AROM;Right;5 reps;Supine Heel Slides: AROM;AAROM;Both;5 reps;Supine      Assessment/Plan    PT Assessment Patient needs continued PT services  PT Diagnosis Difficulty walking;Acute pain   PT Problem List Decreased balance;Decreased mobility;Decreased knowledge of use of  DME;Decreased knowledge of precautions;Decreased range of motion  PT Treatment Interventions Stair training;Gait training;DME instruction;Balance training;Functional mobility training;Therapeutic activities;Therapeutic exercise;Patient/family education   PT Goals (Current goals can be found in the Care Plan section) Acute Rehab PT Goals Patient Stated Goal: To return home PT Goal Formulation: With patient/family Time For Goal Achievement: 03/17/15 Potential to Achieve Goals: Good    Frequency Min 5X/week   Barriers to discharge        Co-evaluation               End of Session   Activity Tolerance: Patient tolerated treatment well Patient left: in chair;with call bell/phone within reach;with family/visitor present      Functional Assessment Tool Used: Clinical Judgement Functional Limitation: Mobility: Walking and moving around Mobility: Walking and Moving Around Current Status (R3202): At least 20 percent but less than 40 percent impaired, limited or restricted Mobility: Walking and Moving Around Goal Status [phone]): At least 20 percent but less than 40 percent impaired, limited or restricted    Time: 6861-6837 PT Time Calculation (min) (ACUTE ONLY): 28 min   Charges:   PT Evaluation $PT Eval Moderate Complexity: 1 Procedure PT Treatments $Gait Training: 8-22 mins   PT G Codes:   PT G-Codes **NOT FOR INPATIENT CLASS** Functional Assessment Tool Used: Clinical Judgement Functional Limitation: Mobility: Walking and moving around Mobility: Walking and Moving Around Current Status (G9021): At least 20 percent but less than 40 percent impaired, limited or restricted Mobility: Walking and Moving Around Goal Status [phone]): At least 20 percent but less than 40 percent impaired, limited or restricted    Reginia Naas 03/10/2015, 4:37 PM Magda Kiel, [place] 03/10/2015

## 2015-03-11 DIAGNOSIS — S82141A Displaced bicondylar fracture of right tibia, initial encounter for closed fracture: Secondary | ICD-10-CM | POA: Diagnosis not present

## 2015-03-11 NOTE — Progress Notes (Signed)
Physical Therapy Treatment Patient Details Name: Kara [name] MRN: [id number] DOB: 09-03-1975 Today's Date: 03/11/2015    History of Present Illness Patient is a 40 year old woman status post right tibial plateau fracture now s/p ORIF.  PMH positive for gastric bypass, R knee scope and GERD.  Reports once fracture stabilized/healed plans to have TKA.    PT Comments    Patient able to walk farther and negotiate stairs.  Will benefit from HHPT for follow up with HEP and safety with mobility.  Gave handout for stairs.  Safe for d/c home with family support.  Follow Up Recommendations  Home health PT     Equipment Recommendations  None recommended by PT    Recommendations for Other Services       Precautions / Restrictions Precautions Precautions: Fall Required Braces or Orthoses: Knee Immobilizer - Right Knee Immobilizer - Right: On when out of bed or walking Restrictions RLE Weight Bearing: Touchdown weight bearing    Mobility  Bed Mobility               General bed mobility comments: up in recliner  Transfers Overall transfer level: Needs assistance Equipment used: Crutches Transfers: Sit to/from Stand Sit to Stand: Min assist         General transfer comment: cues for holding both crutches in one hand, assist for anterior weight shift after failed attempt unaided  Ambulation/Gait Ambulation/Gait assistance: Supervision;Min guard Ambulation Distance (Feet): 60 Feet Assistive device: Crutches Gait Pattern/deviations: Step-to pattern;Decreased stride length     General Gait Details: TDWB on R   Stairs Stairs: Yes Stairs assistance: Min assist Stair Management: One rail Right;With crutches Number of Stairs: 2 General stair comments: cues for sequence  Wheelchair Mobility    Modified Rankin (Stroke Patients Only)       Balance Overall balance assessment: Needs assistance         Standing balance support: Bilateral upper extremity  supported;Single extremity supported Standing balance-Leahy Scale: Poor Standing balance comment: UE support needed due to TDWB                    Cognition Arousal/Alertness: Awake/alert Behavior During Therapy: WFL for tasks assessed/performed Overall Cognitive Status: Within Functional Limits for tasks assessed                      Exercises Total Joint Exercises Ankle Circles/Pumps: AROM;Both;5 reps;Seated Quad Sets: AROM;Right;5 reps;Seated Hip ABduction/ADduction: AROM;Both;5 reps;Seated Straight Leg Raises: AAROM;Right;5 reps;Seated    General Comments        Pertinent Vitals/Pain Pain Assessment: Faces Faces Pain Scale: Hurts little more Pain Location: R knee Pain Intervention(s): Monitored during session;Repositioned;Ice applied    Home Living                      Prior Function            PT Goals (current goals can now be found in the care plan section) Progress towards PT goals: Progressing toward goals    Frequency  Min 5X/week    PT Plan Current plan remains appropriate    Co-evaluation             End of Session   Activity Tolerance: Patient tolerated treatment well Patient left: in chair;with family/visitor present;with call bell/phone within reach     Time: 0925-0941 PT Time Calculation (min) (ACUTE ONLY): 16 min  Charges:  $Gait Training: 8-22 mins  G CodesReginia Naas 03/11/2015, 10:03 AM  Magda Kiel, PT [phone] 03/11/2015

## 2015-03-11 NOTE — Progress Notes (Signed)
No events. NAD VSSAF Dressing c/d/i NVI Compartments soft Patient has pain meds at home Home with [place] home today  N. Eduard Roux, MD [place] 8:38 AM

## 2015-03-11 NOTE — Progress Notes (Signed)
Pt discharge education and instructions completed with pt and partner at bedside; both voices understanding and denies any questions. Pt IV removed; pt discharge home with partner to transport her home. Pt incision dsg clean, dry and intact. No stain or active bleeding noted. Pt crutches at bedside. Pt transported off unit via wheelchair with partner and belongings to the side. Delia Heady RN

## 2015-03-13 ENCOUNTER — Encounter (HOSPITAL_COMMUNITY): Payer: Self-pay | Admitting: Orthopedic Surgery

## 2015-03-22 NOTE — Anesthesia Postprocedure Evaluation (Signed)
Anesthesia Post Note  Patient: Kara [name]  Procedure(s) Performed: Procedure(s) (LRB): OPEN REDUCTION INTERNAL FIXATION (ORIF) LATERAL TIBIAL PLATEAU (Right)  Patient location during evaluation: PACU Anesthesia Type: General Level of consciousness: awake and alert Pain management: pain level controlled Vital Signs Assessment: post-procedure vital signs reviewed and stable Respiratory status: spontaneous breathing, nonlabored ventilation, respiratory function stable and patient connected to nasal cannula oxygen Cardiovascular status: blood pressure returned to baseline and stable Postop Assessment: no signs of nausea or vomiting Anesthetic complications: no    Last Vitals:  Filed Vitals:   03/10/15 2355 03/11/15 0605  BP: 114/65 101/61  Pulse: 73   Temp: 36.9 C 36.6 C  Resp: 16 16    Last Pain:  Filed Vitals:   03/11/15 0754  PainSc: 0-No pain                 Ayvah Caroll S

## 2015-05-23 ENCOUNTER — Other Ambulatory Visit (HOSPITAL_COMMUNITY): Payer: Self-pay | Admitting: Family

## 2015-05-29 ENCOUNTER — Telehealth: Payer: Self-pay | Admitting: Family Medicine

## 2015-05-29 ENCOUNTER — Other Ambulatory Visit: Payer: Self-pay | Admitting: Family Medicine

## 2015-05-29 DIAGNOSIS — E034 Atrophy of thyroid (acquired): Secondary | ICD-10-CM

## 2015-05-29 NOTE — Telephone Encounter (Signed)
Pt has been scheduled for her a [facility]  lab on 06/05/15. Do she need an appt to see Dr Yong Channel. She is having knee replacement on 06/14/15 and can only come in on Monday's.

## 2015-05-29 NOTE — Telephone Encounter (Signed)
I think it would be reasonable to have a visit with me. i don't mind interpreting TSH results without visit though. If she needs surgical clearance- definitely needs a vsiit with me.

## 2015-05-29 NOTE — Progress Notes (Signed)
lmovm for pt to call and schedule an appt

## 2015-05-29 NOTE — Telephone Encounter (Signed)
Does she need to see you as well?

## 2015-05-30 NOTE — Telephone Encounter (Signed)
See below Geronimo.

## 2015-05-30 NOTE — Telephone Encounter (Signed)
lmovm to return call

## 2015-05-31 ENCOUNTER — Encounter: Payer: Self-pay | Admitting: Family Medicine

## 2015-06-02 ENCOUNTER — Other Ambulatory Visit (HOSPITAL_COMMUNITY): Payer: Self-pay | Admitting: *Deleted

## 2015-06-02 NOTE — Pre-Procedure Instructions (Signed)
    Arlene Brickel  06/02/2015      [facility] [address] [place], [place] - [address] [place] [address] Phone: [phone] Fax: [phone]  [facility] [address], [place] - [address] [place] [address] Phone: [phone] Fax: [phone]    Your procedure is scheduled on 06-14-2015    Wednesday   Report to [facility] Admitting at 6:30 A.M.   Call this number if you have problems the morning of surgery:  [phone]   Remember:  Do not eat food or drink liquids after midnight.   Take these medicines the morning of surgery with A SIP OF WATER Tylenol if  Needed,levothyroxine(Synthroid), tramadol(ultram) if needed,venlafaxine(Effexor)             1 week prior to surgery stop NSAID drugs: advil, motrin, ibuprofen, aleve, Goody's BC'S and stop vitamins/herbal medicines   Do not wear jewelry, make-up or nail polish.  Do not wear lotions, powders, or perfumes.  You may not wear deodorant.  Do not shave 48 hours prior to surgery.     Do not bring valuables to the hospital.  [facility] is not responsible for any belongings or valuables.  Contacts, dentures or bridgework may not be worn into surgery.  Leave your suitcase in the car.  After surgery it may be brought to your room.  For patients admitted to the hospital, discharge time will be determined by your treatment team.  Patients discharged the day of surgery will not be allowed to drive home.    Special instructions:  See attached Sheet for instructions on CHG showers  Please read over the following fact sheets that you were given. Pain Booklet, Coughing and Deep Breathing and Surgical Site Infection Prevention

## 2015-06-05 ENCOUNTER — Encounter (HOSPITAL_COMMUNITY)
Admission: RE | Admit: 2015-06-05 | Discharge: 2015-06-05 | Disposition: A | Discharge status: Home / Self Care | Payer: Managed Care, Other (non HMO) | Source: Ambulatory Visit | Attending: Orthopedic Surgery | Admitting: Orthopedic Surgery

## 2015-06-05 ENCOUNTER — Encounter (HOSPITAL_COMMUNITY): Payer: Self-pay

## 2015-06-05 ENCOUNTER — Other Ambulatory Visit (INDEPENDENT_AMBULATORY_CARE_PROVIDER_SITE_OTHER): Payer: Managed Care, Other (non HMO)

## 2015-06-05 ENCOUNTER — Other Ambulatory Visit: Payer: Self-pay | Admitting: Family Medicine

## 2015-06-05 DIAGNOSIS — Z01812 Encounter for preprocedural laboratory examination: Secondary | ICD-10-CM | POA: Insufficient documentation

## 2015-06-05 DIAGNOSIS — E034 Atrophy of thyroid (acquired): Secondary | ICD-10-CM | POA: Diagnosis not present

## 2015-06-05 DIAGNOSIS — E038 Other specified hypothyroidism: Secondary | ICD-10-CM | POA: Diagnosis not present

## 2015-06-05 DIAGNOSIS — M1711 Unilateral primary osteoarthritis, right knee: Secondary | ICD-10-CM | POA: Diagnosis not present

## 2015-06-05 LAB — TSH: TSH: 3.24 u[IU]/mL (ref 0.35–4.50)

## 2015-06-05 LAB — CBC
HCT: 39.4 % (ref 36.0–46.0)
Hemoglobin: 13 g/dL (ref 12.0–15.0)
MCH: 28.8 pg (ref 26.0–34.0)
MCHC: 33 g/dL (ref 30.0–36.0)
MCV: 87.4 fL (ref 78.0–100.0)
PLATELETS: 313 10*3/uL (ref 150–400)
RBC: 4.51 MIL/uL (ref 3.87–5.11)
RDW: 13.6 % (ref 11.5–15.5)
WBC: 7.9 10*3/uL (ref 4.0–10.5)

## 2015-06-05 LAB — HCG, SERUM, QUALITATIVE: PREG SERUM: NEGATIVE

## 2015-06-05 LAB — SURGICAL PCR SCREEN
MRSA, PCR: NEGATIVE
Staphylococcus aureus: NEGATIVE

## 2015-06-05 NOTE — Progress Notes (Signed)
PCP: Dr. Allen Norris @[facility] 

## 2015-06-14 ENCOUNTER — Encounter (HOSPITAL_COMMUNITY)
Admission: RE | Disposition: A | Discharge status: Home Health | Payer: Self-pay | Source: Ambulatory Visit | Attending: Orthopedic Surgery

## 2015-06-14 ENCOUNTER — Inpatient Hospital Stay (HOSPITAL_COMMUNITY): Payer: Managed Care, Other (non HMO) | Admitting: Anesthesiology

## 2015-06-14 ENCOUNTER — Encounter (HOSPITAL_COMMUNITY): Payer: Self-pay | Admitting: *Deleted

## 2015-06-14 ENCOUNTER — Inpatient Hospital Stay (HOSPITAL_COMMUNITY)
Admission: RE | Admit: 2015-06-14 | Discharge: 2015-06-16 | DRG: 470 | Disposition: A | Discharge status: Home Health | Payer: Managed Care, Other (non HMO) | Source: Ambulatory Visit | Attending: Orthopedic Surgery | Admitting: Orthopedic Surgery

## 2015-06-14 DIAGNOSIS — M1711 Unilateral primary osteoarthritis, right knee: Principal | ICD-10-CM | POA: Diagnosis present

## 2015-06-14 DIAGNOSIS — Z79899 Other long term (current) drug therapy: Secondary | ICD-10-CM

## 2015-06-14 DIAGNOSIS — Z818 Family history of other mental and behavioral disorders: Secondary | ICD-10-CM

## 2015-06-14 DIAGNOSIS — Z96651 Presence of right artificial knee joint: Secondary | ICD-10-CM

## 2015-06-14 DIAGNOSIS — Z87891 Personal history of nicotine dependence: Secondary | ICD-10-CM | POA: Diagnosis not present

## 2015-06-14 DIAGNOSIS — E559 Vitamin D deficiency, unspecified: Secondary | ICD-10-CM | POA: Diagnosis present

## 2015-06-14 DIAGNOSIS — E669 Obesity, unspecified: Secondary | ICD-10-CM | POA: Diagnosis present

## 2015-06-14 DIAGNOSIS — Z803 Family history of malignant neoplasm of breast: Secondary | ICD-10-CM | POA: Diagnosis not present

## 2015-06-14 DIAGNOSIS — Z8049 Family history of malignant neoplasm of other genital organs: Secondary | ICD-10-CM

## 2015-06-14 DIAGNOSIS — F329 Major depressive disorder, single episode, unspecified: Secondary | ICD-10-CM | POA: Diagnosis present

## 2015-06-14 DIAGNOSIS — Z8249 Family history of ischemic heart disease and other diseases of the circulatory system: Secondary | ICD-10-CM

## 2015-06-14 DIAGNOSIS — Z886 Allergy status to analgesic agent status: Secondary | ICD-10-CM | POA: Diagnosis not present

## 2015-06-14 DIAGNOSIS — Z96659 Presence of unspecified artificial knee joint: Secondary | ICD-10-CM

## 2015-06-14 HISTORY — PX: HARDWARE REMOVAL: SHX979

## 2015-06-14 HISTORY — PX: TOTAL KNEE ARTHROPLASTY: SHX125

## 2015-06-14 SURGERY — REMOVAL, HARDWARE
Anesthesia: General | Laterality: Right

## 2015-06-14 MED ORDER — CEFAZOLIN SODIUM-DEXTROSE 2-4 GM/100ML-% IV SOLN
INTRAVENOUS | Status: AC
  Filled 2015-06-14: qty 100

## 2015-06-14 MED ORDER — TRANEXAMIC ACID 1000 MG/10ML IV SOLN
2000.0000 mg | INTRAVENOUS | Status: DC
  Filled 2015-06-14: qty 20

## 2015-06-14 MED ORDER — PROPOFOL 500 MG/50ML IV EMUL
INTRAVENOUS | Status: DC | PRN
  Administered 2015-06-14: 75 ug/kg/min via INTRAVENOUS

## 2015-06-14 MED ORDER — CHLORHEXIDINE GLUCONATE 4 % EX LIQD: 60.0000 mL | Freq: Once | CUTANEOUS | Status: DC

## 2015-06-14 MED ORDER — METOCLOPRAMIDE HCL 5 MG PO TABS: 5.0000 mg | ORAL_TABLET | Freq: Three times a day (TID) | ORAL | Status: DC | PRN

## 2015-06-14 MED ORDER — MAGNESIUM CITRATE PO SOLN: 1.0000 | Freq: Once | ORAL | Status: DC | PRN

## 2015-06-14 MED ORDER — RIVAROXABAN 10 MG PO TABS
10.0000 mg | ORAL_TABLET | Freq: Every day | ORAL | Status: DC
  Administered 2015-06-15 – 2015-06-16 (×2): 10 mg via ORAL
  Filled 2015-06-14 (×2): qty 1

## 2015-06-14 MED ORDER — ONDANSETRON HCL 4 MG/2ML IJ SOLN
INTRAMUSCULAR | Status: AC
  Filled 2015-06-14: qty 4

## 2015-06-14 MED ORDER — MIDAZOLAM HCL 2 MG/2ML IJ SOLN
INTRAMUSCULAR | Status: AC
  Filled 2015-06-14: qty 2

## 2015-06-14 MED ORDER — HYDROMORPHONE HCL 1 MG/ML IJ SOLN
1.0000 mg | INTRAMUSCULAR | Status: DC | PRN
  Administered 2015-06-14 – 2015-06-15 (×2): 1 mg via INTRAVENOUS
  Filled 2015-06-14 (×2): qty 1

## 2015-06-14 MED ORDER — POLYETHYLENE GLYCOL 3350 17 G PO PACK: 17.0000 g | PACK | Freq: Every day | ORAL | Status: DC | PRN

## 2015-06-14 MED ORDER — LACTATED RINGERS IV SOLN
INTRAVENOUS | Status: DC | PRN
Start: 2015-06-14 — End: 2015-06-14
  Administered 2015-06-14 (×2): via INTRAVENOUS

## 2015-06-14 MED ORDER — METOCLOPRAMIDE HCL 5 MG/ML IJ SOLN: 5.0000 mg | Freq: Three times a day (TID) | INTRAMUSCULAR | Status: DC | PRN

## 2015-06-14 MED ORDER — METHOCARBAMOL 1000 MG/10ML IJ SOLN
500.0000 mg | Freq: Four times a day (QID) | INTRAVENOUS | Status: DC | PRN
  Filled 2015-06-14: qty 5

## 2015-06-14 MED ORDER — ARTIFICIAL TEARS OP OINT
TOPICAL_OINTMENT | OPHTHALMIC | Status: AC
  Filled 2015-06-14: qty 3.5

## 2015-06-14 MED ORDER — FENTANYL CITRATE (PF) 250 MCG/5ML IJ SOLN
INTRAMUSCULAR | Status: AC
  Filled 2015-06-14: qty 5

## 2015-06-14 MED ORDER — 0.9 % SODIUM CHLORIDE (POUR BTL) OPTIME
TOPICAL | Status: DC | PRN
Start: 2015-06-14 — End: 2015-06-14
  Administered 2015-06-14: 1000 mL

## 2015-06-14 MED ORDER — METHOCARBAMOL 500 MG PO TABS
ORAL_TABLET | ORAL | Status: AC
  Administered 2015-06-14: 12:00:00
  Filled 2015-06-14: qty 1

## 2015-06-14 MED ORDER — ONDANSETRON HCL 4 MG/2ML IJ SOLN: 4.0000 mg | Freq: Four times a day (QID) | INTRAMUSCULAR | Status: DC | PRN

## 2015-06-14 MED ORDER — BISACODYL 10 MG RE SUPP: 10.0000 mg | Freq: Every day | RECTAL | Status: DC | PRN

## 2015-06-14 MED ORDER — LIDOCAINE HCL (CARDIAC) 20 MG/ML IV SOLN
INTRAVENOUS | Status: DC | PRN
  Administered 2015-06-14: 40 mg via INTRATRACHEAL

## 2015-06-14 MED ORDER — OXYCODONE HCL 5 MG/5ML PO SOLN: 5.0000 mg | Freq: Once | ORAL | Status: AC | PRN

## 2015-06-14 MED ORDER — CEFAZOLIN SODIUM-DEXTROSE 2-4 GM/100ML-% IV SOLN
2.0000 g | INTRAVENOUS | Status: AC
  Administered 2015-06-14: 2 g via INTRAVENOUS

## 2015-06-14 MED ORDER — ACETAMINOPHEN 650 MG RE SUPP: 650.0000 mg | Freq: Four times a day (QID) | RECTAL | Status: DC | PRN

## 2015-06-14 MED ORDER — SODIUM CHLORIDE 0.9 % IV SOLN
2000.0000 mg | INTRAVENOUS | Status: DC | PRN
  Administered 2015-06-14: 2000 mg via TOPICAL

## 2015-06-14 MED ORDER — LEVOTHYROXINE SODIUM 100 MCG PO TABS
200.0000 ug | ORAL_TABLET | Freq: Every day | ORAL | Status: DC
  Administered 2015-06-15 – 2015-06-16 (×2): 200 ug via ORAL
  Filled 2015-06-14 (×2): qty 2

## 2015-06-14 MED ORDER — OXYCODONE HCL 5 MG PO TABS
5.0000 mg | ORAL_TABLET | ORAL | Status: DC | PRN
  Administered 2015-06-14 (×3): 10 mg via ORAL
  Administered 2015-06-15: 5 mg via ORAL
  Administered 2015-06-15 – 2015-06-16 (×5): 10 mg via ORAL
  Filled 2015-06-14 (×9): qty 2

## 2015-06-14 MED ORDER — MIDAZOLAM HCL 5 MG/5ML IJ SOLN
INTRAMUSCULAR | Status: DC | PRN
  Administered 2015-06-14: 2 mg via INTRAVENOUS

## 2015-06-14 MED ORDER — PROPOFOL 10 MG/ML IV BOLUS
INTRAVENOUS | Status: DC | PRN
  Administered 2015-06-14 (×2): 20 mg via INTRAVENOUS

## 2015-06-14 MED ORDER — VENLAFAXINE HCL 37.5 MG PO TABS
37.5000 mg | ORAL_TABLET | Freq: Two times a day (BID) | ORAL | Status: DC
  Administered 2015-06-14 – 2015-06-16 (×4): 37.5 mg via ORAL
  Filled 2015-06-14 (×5): qty 1

## 2015-06-14 MED ORDER — ACETAMINOPHEN 325 MG PO TABS
650.0000 mg | ORAL_TABLET | Freq: Four times a day (QID) | ORAL | Status: DC | PRN
Start: 2015-06-14 — End: 2015-06-16

## 2015-06-14 MED ORDER — MENTHOL 3 MG MT LOZG: 1.0000 | LOZENGE | OROMUCOSAL | Status: DC | PRN

## 2015-06-14 MED ORDER — PROPOFOL 10 MG/ML IV BOLUS
INTRAVENOUS | Status: AC
  Filled 2015-06-14: qty 40

## 2015-06-14 MED ORDER — BUPIVACAINE LIPOSOME 1.3 % IJ SUSP
INTRAMUSCULAR | Status: DC | PRN
  Administered 2015-06-14: 20 mL

## 2015-06-14 MED ORDER — OXYCODONE HCL 5 MG PO TABS
ORAL_TABLET | ORAL | Status: AC
  Administered 2015-06-14: 5 mg
  Filled 2015-06-14: qty 1

## 2015-06-14 MED ORDER — ROCURONIUM BROMIDE 50 MG/5ML IV SOLN
INTRAVENOUS | Status: AC
  Filled 2015-06-14: qty 1

## 2015-06-14 MED ORDER — KETOROLAC TROMETHAMINE 15 MG/ML IJ SOLN
15.0000 mg | Freq: Four times a day (QID) | INTRAMUSCULAR | Status: AC
  Administered 2015-06-14 – 2015-06-15 (×4): 15 mg via INTRAVENOUS
  Filled 2015-06-14 (×4): qty 1

## 2015-06-14 MED ORDER — BUPIVACAINE LIPOSOME 1.3 % IJ SUSP
20.0000 mL | INTRAMUSCULAR | Status: DC
  Filled 2015-06-14: qty 20

## 2015-06-14 MED ORDER — SODIUM CHLORIDE 0.9 % IR SOLN
Status: DC | PRN
  Administered 2015-06-14: 3000 mL

## 2015-06-14 MED ORDER — CEFAZOLIN SODIUM 1-5 GM-% IV SOLN
1.0000 g | Freq: Four times a day (QID) | INTRAVENOUS | Status: AC
  Administered 2015-06-14 (×2): 1 g via INTRAVENOUS
  Filled 2015-06-14 (×2): qty 50

## 2015-06-14 MED ORDER — ONDANSETRON HCL 4 MG PO TABS: 4.0000 mg | ORAL_TABLET | Freq: Four times a day (QID) | ORAL | Status: DC | PRN

## 2015-06-14 MED ORDER — FENTANYL CITRATE (PF) 100 MCG/2ML IJ SOLN
INTRAMUSCULAR | Status: DC | PRN
  Administered 2015-06-14: 50 ug via INTRAVENOUS

## 2015-06-14 MED ORDER — PHENOL 1.4 % MT LIQD: 1.0000 | OROMUCOSAL | Status: DC | PRN

## 2015-06-14 MED ORDER — SODIUM CHLORIDE 0.9 % IV SOLN
INTRAVENOUS | Status: DC
  Administered 2015-06-14: 13:00:00 via INTRAVENOUS
  Administered 2015-06-15: 10 mL/h via INTRAVENOUS

## 2015-06-14 MED ORDER — OXYCODONE HCL 5 MG PO TABS
5.0000 mg | ORAL_TABLET | Freq: Once | ORAL | Status: AC | PRN
  Administered 2015-06-14: 5 mg via ORAL

## 2015-06-14 MED ORDER — DOCUSATE SODIUM 100 MG PO CAPS
100.0000 mg | ORAL_CAPSULE | Freq: Two times a day (BID) | ORAL | Status: DC
  Administered 2015-06-14 – 2015-06-16 (×5): 100 mg via ORAL
  Filled 2015-06-14 (×5): qty 1

## 2015-06-14 MED ORDER — ONDANSETRON HCL 4 MG/2ML IJ SOLN: 4.0000 mg | Freq: Once | INTRAMUSCULAR | Status: DC | PRN

## 2015-06-14 MED ORDER — METHOCARBAMOL 500 MG PO TABS
500.0000 mg | ORAL_TABLET | Freq: Four times a day (QID) | ORAL | Status: DC | PRN
  Administered 2015-06-14 – 2015-06-16 (×3): 500 mg via ORAL
  Filled 2015-06-14 (×2): qty 1

## 2015-06-14 MED ORDER — FENTANYL CITRATE (PF) 100 MCG/2ML IJ SOLN: 25.0000 ug | INTRAMUSCULAR | Status: DC | PRN

## 2015-06-14 SURGICAL SUPPLY — 70 items
BANDAGE ESMARK 6X9 LF (GAUZE/BANDAGES/DRESSINGS) IMPLANT
BLADE SAG 18X100X1.27 (BLADE) ×2 IMPLANT
BLADE SAGITTAL 25.0X1.27X90 (BLADE) ×2 IMPLANT
BLADE SURG 21 STRL SS (BLADE) ×4 IMPLANT
BNDG COHESIVE 4X5 TAN STRL (GAUZE/BANDAGES/DRESSINGS) IMPLANT
BNDG COHESIVE 6X5 TAN STRL LF (GAUZE/BANDAGES/DRESSINGS) ×2 IMPLANT
BNDG ESMARK 6X9 LF (GAUZE/BANDAGES/DRESSINGS)
BNDG GAUZE ELAST 4 BULKY (GAUZE/BANDAGES/DRESSINGS) ×2 IMPLANT
BONE CEMENT PALACOSE (Orthopedic Implant) ×4 IMPLANT
BOWL SMART MIX CTS (DISPOSABLE) ×2 IMPLANT
CAP KNEE TOTAL 3 SIGMA ×2 IMPLANT
CEMENT BONE PALACOSE (Orthopedic Implant) ×2 IMPLANT
COVER SURGICAL LIGHT HANDLE (MISCELLANEOUS) ×4 IMPLANT
CUFF TOURNIQUET SINGLE 34IN LL (TOURNIQUET CUFF) ×2 IMPLANT
CUFF TOURNIQUET SINGLE 44IN (TOURNIQUET CUFF) IMPLANT
DRAPE C-ARM 42X72 X-RAY (DRAPES) IMPLANT
DRAPE EXTREMITY T 121X128X90 (DRAPE) ×2 IMPLANT
DRAPE INCISE IOBAN 66X45 STRL (DRAPES) IMPLANT
DRAPE ORTHO SPLIT 77X108 STRL (DRAPES)
DRAPE PROXIMA HALF (DRAPES) ×2 IMPLANT
DRAPE SURG ORHT 6 SPLT 77X108 (DRAPES) IMPLANT
DRAPE U-SHAPE 47X51 STRL (DRAPES) ×2 IMPLANT
DRSG ADAPTIC 3X8 NADH LF (GAUZE/BANDAGES/DRESSINGS) ×2 IMPLANT
DRSG EMULSION OIL 3X3 NADH (GAUZE/BANDAGES/DRESSINGS) ×2 IMPLANT
DRSG PAD ABDOMINAL 8X10 ST (GAUZE/BANDAGES/DRESSINGS) ×2 IMPLANT
DURAPREP 26ML APPLICATOR (WOUND CARE) ×2 IMPLANT
ELECT REM PT RETURN 9FT ADLT (ELECTROSURGICAL) ×2
ELECTRODE REM PT RTRN 9FT ADLT (ELECTROSURGICAL) ×1 IMPLANT
FACESHIELD WRAPAROUND (MASK) ×2 IMPLANT
GAUZE SPONGE 4X4 12PLY STRL (GAUZE/BANDAGES/DRESSINGS) ×2 IMPLANT
GLOVE BIOGEL PI IND STRL 9 (GLOVE) ×1 IMPLANT
GLOVE BIOGEL PI INDICATOR 9 (GLOVE) ×1
GLOVE SURG ORTHO 9.0 STRL STRW (GLOVE) ×2 IMPLANT
GOWN STRL REUS W/ TWL XL LVL3 (GOWN DISPOSABLE) ×3 IMPLANT
GOWN STRL REUS W/TWL XL LVL3 (GOWN DISPOSABLE) ×3
HANDPIECE INTERPULSE COAX TIP (DISPOSABLE) ×1
KIT BASIN OR (CUSTOM PROCEDURE TRAY) ×2 IMPLANT
KIT ROOM TURNOVER OR (KITS) ×2 IMPLANT
MANIFOLD NEPTUNE II (INSTRUMENTS) ×2 IMPLANT
NEEDLE SPNL 18GX3.5 QUINCKE PK (NEEDLE) ×2 IMPLANT
NS IRRIG 1000ML POUR BTL (IV SOLUTION) ×2 IMPLANT
PACK ORTHO EXTREMITY (CUSTOM PROCEDURE TRAY) ×2 IMPLANT
PACK TOTAL JOINT (CUSTOM PROCEDURE TRAY) ×2 IMPLANT
PACK UNIVERSAL I (CUSTOM PROCEDURE TRAY) ×2 IMPLANT
PAD ARMBOARD 7.5X6 YLW CONV (MISCELLANEOUS) ×4 IMPLANT
PAD CAST 4YDX4 CTTN HI CHSV (CAST SUPPLIES) ×1 IMPLANT
PADDING CAST COTTON 4X4 STRL (CAST SUPPLIES) ×1
PADDING CAST COTTON 6X4 STRL (CAST SUPPLIES) ×2 IMPLANT
SET HNDPC FAN SPRY TIP SCT (DISPOSABLE) ×1 IMPLANT
SPONGE GAUZE 4X4 12PLY STER LF (GAUZE/BANDAGES/DRESSINGS) ×2 IMPLANT
SPONGE LAP 18X18 X RAY DECT (DISPOSABLE) IMPLANT
STAPLER VISISTAT 35W (STAPLE) ×2 IMPLANT
STOCKINETTE IMPERVIOUS 9X36 MD (GAUZE/BANDAGES/DRESSINGS) IMPLANT
SUCTION FRAZIER HANDLE 10FR (MISCELLANEOUS)
SUCTION TUBE FRAZIER 10FR DISP (MISCELLANEOUS) IMPLANT
SUT ETHILON 2 0 PSLX (SUTURE) IMPLANT
SUT VIC AB 0 CT1 27 (SUTURE) ×1
SUT VIC AB 0 CT1 27XBRD ANBCTR (SUTURE) ×1 IMPLANT
SUT VIC AB 1 CTX 36 (SUTURE) ×1
SUT VIC AB 1 CTX36XBRD ANBCTR (SUTURE) ×1 IMPLANT
SUT VIC AB 2-0 CT1 27 (SUTURE)
SUT VIC AB 2-0 CT1 TAPERPNT 27 (SUTURE) IMPLANT
SYR 50ML LL SCALE MARK (SYRINGE) ×2 IMPLANT
TOWEL OR 17X24 6PK STRL BLUE (TOWEL DISPOSABLE) ×2 IMPLANT
TOWEL OR 17X26 10 PK STRL BLUE (TOWEL DISPOSABLE) ×2 IMPLANT
TRAY FOLEY CATH 16FRSI W/METER (SET/KITS/TRAYS/PACK) IMPLANT
UNDERPAD 30X30 INCONTINENT (UNDERPADS AND DIAPERS) ×2 IMPLANT
UPCHARGE REV TRAY MBT KNEE ×2 IMPLANT
WATER STERILE IRR 1000ML POUR (IV SOLUTION) ×2 IMPLANT
WRAP KNEE MAXI GEL POST OP (GAUZE/BANDAGES/DRESSINGS) ×2 IMPLANT

## 2015-06-14 NOTE — H&P (Signed)
TOTAL KNEE ADMISSION H&P  Patient is being admitted for right total knee arthroplasty.  Subjective:  Chief Complaint:right knee pain.  HPI: Kara [name], 40 y.o. female, has a history of pain and functional disability in the right knee due to arthritis and has failed non-surgical conservative treatments for greater than 12 weeks to includeNSAID's and/or analgesics, corticosteriod injections and activity modification.  Onset of symptoms was gradual, starting 8 years ago with gradually worsening course since that time. The patient noted prior procedures on the knee to include  ORIF tibial plateau fracture on the right knee(s).  Patient currently rates pain in the right knee(s) at 8 out of 10 with activity. Patient has night pain, worsening of pain with activity and weight bearing, pain that interferes with activities of daily living, pain with passive range of motion, crepitus and joint swelling.  Patient has evidence of subchondral cysts, subchondral sclerosis, periarticular osteophytes, joint subluxation and joint space narrowing by imaging studies. This patient has had avascular necrosis of the knee. There is no active infection.  Patient Active Problem List   Diagnosis Date Noted  . Tibial plateau fracture 03/10/2015  . Allergic rhinitis 02/03/2014  . Vitamin D deficiency 02/03/2014  . BPPV (benign paroxysmal positional vertigo) 04/19/2013  . Osteoarthritis of both knees 04/19/2013  . 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 Depression 02/22/2010  . DJD (degenerative joint disease) 10/26/2009  . OBESITY 06/16/2008  . Hyperlipidemia 06/11/2007  . Hypothyroidism 08/18/2006   Past Medical History  Diagnosis Date  . ANXIETY 08/18/2006  . 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 DEPRESSION 02/22/2010  . HYPERLIPIDEMIA 06/11/2007  . HYPOTHYROIDISM 08/18/2006  . OBESITY 06/16/2008  . Pneumonia     hx of   . PONV (postoperative nausea and vomiting)     used Scopolamine patch   . GERD (gastroesophageal reflux disease)     history of    Past Surgical History  Procedure Laterality Date  . Orif ulnar fracture Right 1995    plates and pins  . Cholecystectomy N/A 05/08/2012    Procedure: LAPAROSCOPIC CHOLECYSTECTOMY WITH INTRAOPERATIVE CHOLANGIOGRAM;  Surgeon: Leighton Ruff, MD;  Location: [facility];  Service: General;  Laterality: N/A;  . Knee arthroscopy Right 07/28/12    dr. Sharol Given  . 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 Upper gi endoscopy  12/29/2012    Procedure: UPPER GI ENDOSCOPY;  Surgeon: Madilyn Hook, DO;  Location: [facility];  Service: General;;  . Hernia repair    . Fracture surgery    . Orif tibia plateau Right 03/10/2015    Procedure: OPEN REDUCTION INTERNAL FIXATION (ORIF) LATERAL TIBIAL PLATEAU;  Surgeon: [name], MD;  Location: [place];  Service: Orthopedics;  Laterality: Right;    Prescriptions prior to admission  Medication Sig Dispense Refill Last Dose  . acetaminophen (TYLENOL) 325 MG tablet Take 650 mg by mouth every 6 (six) hours as needed for mild pain or moderate pain.     . calcium carbonate (OS-CAL - DOSED IN MG OF ELEMENTAL CALCIUM) 1250 (500 Ca) MG tablet Take 1 tablet by mouth 3 (three) times daily with meals.     Marland Kitchen glucosamine-chondroitin 500-400 MG tablet Take 1 tablet by mouth daily.   Past Week at Unknown time  . medroxyPROGESTERone (DEPO-PROVERA) 150 MG/ML injection Inject 150 mg into the muscle every 3 (three) months. Last dose  was 12/10/12   Past Month at Unknown time  . Menthol (BIOFREEZE) 10.5 % AERO Apply topically.     . Pediatric Multiple Vit-C-FA (FLINSTONES GUMMIES OMEGA-3 DHA) CHEW Chew 1 each by mouth 2 (two) times daily.    Past Week at Unknown time  . traMADol (ULTRAM) 50 MG tablet Take 1 tablet (50 mg total) by mouth 3 (three) times daily as needed. (Patient taking differently: Take 50 mg by  mouth every 12 (twelve) hours as needed for moderate pain. ) 90 tablet 5 03/10/2015 at 0800  . vitamin B-12 (CYANOCOBALAMIN) 500 MCG tablet Take 500 mcg by mouth daily.   Past Week at Unknown time  . levothyroxine (SYNTHROID, LEVOTHROID) 200 MCG tablet TAKE ONE TABLET BY MOUTH ONCE DAILY 30 tablet 5   . venlafaxine (EFFEXOR) 37.5 MG tablet TAKE ONE TABLET BY MOUTH TWICE DAILY 180 tablet 3    Allergies  Allergen Reactions  . Asa [Aspirin] Itching and Swelling    Social History  Substance Use Topics  . Smoking status: Former Smoker    Types: Cigarettes    Quit date: 01/28/2005  . Smokeless tobacco: Never Used  . Alcohol Use: No     Comment: not since gastric bypass    Family History  Problem Relation Age of Onset  . Schizophrenia Mother   . Cancer Mother     lung, smoker  . Heart disease Father 33    smoker at time  . Breast cancer Mother 34  . Uterine cancer Mother      Review of Systems  All other systems reviewed and are negative.   Objective:  Physical Exam  Vital signs in last 24 hours:    Labs:   Estimated body mass index is 31.91 kg/(m^2) as calculated from the following:   Height as of 03/10/15: 5' 7"  (1.702 m).   Weight as of 03/08/15: 92.443 kg (203 lb 12.8 oz).   Imaging Review Plain radiographs demonstrate moderate degenerative joint disease of the right knee(s). The overall alignment ismild valgus. The bone quality appears to be adequate for age and reported activity level.  Assessment/Plan:  End stage arthritis, right knee   The patient history, physical examination, clinical judgment of the provider and imaging studies are consistent with end stage degenerative joint disease of the right knee(s) and total knee arthroplasty is deemed medically necessary. The treatment options including medical management, injection therapy arthroscopy and arthroplasty were discussed at length. The risks and benefits of total knee arthroplasty were presented and reviewed.  The risks due to aseptic loosening, infection, stiffness, patella tracking problems, thromboembolic complications and other imponderables were discussed. The patient acknowledged the explanation, agreed to proceed with the plan and consent was signed. Patient is being admitted for inpatient treatment for surgery, pain control, PT, OT, prophylactic antibiotics, VTE prophylaxis, progressive ambulation and ADL's and discharge planning. The patient is planning to be discharged home with home health services

## 2015-06-14 NOTE — Anesthesia Postprocedure Evaluation (Signed)
Anesthesia Post Note  Patient: Kara [name]  Procedure(s) Performed: Procedure(s) (LRB): Removal Internal Fixation Right Lateral Tibial Plateau (Right) RIGHT TOTAL KNEE ARTHROPLASTY (Right)  Patient location during evaluation: PACU Anesthesia Type: MAC and Spinal Level of consciousness: awake, awake and alert and oriented Pain management: pain level controlled Vital Signs Assessment: post-procedure vital signs reviewed and stable Respiratory status: spontaneous breathing, respiratory function stable and nonlabored ventilation Cardiovascular status: blood pressure returned to baseline Postop Assessment: spinal receding Anesthetic complications: no    Last Vitals:  Filed Vitals:   06/14/15 1201 06/14/15 1211  BP: 109/68 107/45  Pulse: 76 69  Temp:  36.8 C  Resp: 14 17    Last Pain:  Filed Vitals:   06/14/15 1212  PainSc: 2                  Fendi Meinhardt COKER

## 2015-06-14 NOTE — Anesthesia Procedure Notes (Signed)
Procedure Name: MAC Performed by: Maryland Pink Pre-anesthesia Checklist: Patient identified, Emergency Drugs available, Suction available, Patient being monitored and Timeout performed Patient Re-evaluated:Patient Re-evaluated prior to inductionOxygen Delivery Method: Simple face mask Preoxygenation: Pre-oxygenation with 100% oxygen Intubation Type: IV induction Placement Confirmation: positive ETCO2 Dental Injury: Teeth and Oropharynx as per pre-operative assessment

## 2015-06-14 NOTE — Anesthesia Preprocedure Evaluation (Signed)
Anesthesia Evaluation  Patient identified by MRN, date of birth, ID band Patient awake    Reviewed: Allergy & Precautions, NPO status , Patient's Chart, lab work & pertinent test results  Airway Mallampati: II  TM Distance: >3 FB Neck ROM: Full    Dental  (+) Teeth Intact, Dental Advisory Given   Pulmonary former smoker,    breath sounds clear to auscultation       Cardiovascular  Rhythm:Regular Rate:Normal     Neuro/Psych    GI/Hepatic   Endo/Other    Renal/GU      Musculoskeletal   Abdominal   Peds  Hematology   Anesthesia Other Findings   Reproductive/Obstetrics                             Anesthesia Physical Anesthesia Plan  ASA: II  Anesthesia Plan: MAC and Spinal   Post-op Pain Management:    Induction: Intravenous  Airway Management Planned: Natural Airway and Nasal Cannula  Additional Equipment:   Intra-op Plan:   Post-operative Plan:   Informed Consent: I have reviewed the patients History and Physical, chart, labs and discussed the procedure including the risks, benefits and alternatives for the proposed anesthesia with the patient or authorized representative who has indicated his/her understanding and acceptance.   Dental advisory given  Plan Discussed with: CRNA and Anesthesiologist  Anesthesia Plan Comments:         Anesthesia Quick Evaluation

## 2015-06-14 NOTE — Discharge Instructions (Addendum)
Information on my medicine - XARELTO (Rivaroxaban)  This medication education was reviewed with me or my healthcare representative as part of my discharge preparation.  The pharmacist that spoke with me during my hospital stay was:  Duayne Cal, [facility]  Why was Xarelto prescribed for you? Xarelto was prescribed for you to reduce the risk of blood clots forming after orthopedic surgery. The medical term for these abnormal blood clots is venous thromboembolism (VTE).  What do you need to know about xarelto ? Take your Xarelto ONCE DAILY at the same time every day. You may take it either with or without food.  If you have difficulty swallowing the tablet whole, you may crush it and mix in applesauce just prior to taking your dose.  Take Xarelto exactly as prescribed by your doctor and DO NOT stop taking Xarelto without talking to the doctor who prescribed the medication.  Stopping without other VTE prevention medication to take the place of Xarelto may increase your risk of developing a clot.  After discharge, you should have regular check-up appointments with your healthcare provider that is prescribing your Xarelto.    What do you do if you miss a dose? If you miss a dose, take it as soon as you remember on the same day then continue your regularly scheduled once daily regimen the next day. Do not take two doses of Xarelto on the same day.   Important Safety Information A possible side effect of Xarelto is bleeding. You should call your healthcare provider right away if you experience any of the following: ? Bleeding from an injury or your nose that does not stop. ? Unusual colored urine (red or dark brown) or unusual colored stools (red or black). ? Unusual bruising for unknown reasons. ? A serious fall or if you hit your head (even if there is no bleeding).  Some medicines may interact with Xarelto and might increase your risk of bleeding while on Xarelto. To help avoid  this, consult your healthcare provider or pharmacist prior to using any new prescription or non-prescription medications, including herbals, vitamins, non-steroidal anti-inflammatory drugs (NSAIDs) and supplements.  This website has more information on Xarelto: [email or web address]-[email or web address].[email or web address]/.  INSTRUCTIONS AFTER JOINT REPLACEMENT   o Remove items at home which could result in a fall. This includes throw rugs or furniture in walking pathways o ICE to the affected joint every three hours while awake for 30 minutes at a time, for at least the first 3-5 days, and then as needed for pain and swelling.  Continue to use ice for pain and swelling. You may notice swelling that will progress down to the foot and ankle.  This is normal after surgery.  Elevate your leg when you are not up walking on it.   o Continue to use the breathing machine you got in the hospital (incentive spirometer) which will help keep your temperature down.  It is common for your temperature to cycle up and down following surgery, especially at night when you are not up moving around and exerting yourself.  The breathing machine keeps your lungs expanded and your temperature down.   DIET:  As you were doing prior to hospitalization, we recommend a well-balanced diet.  DRESSING / WOUND CARE / SHOWERING  You may change your dressing 3-5 days after surgery.  Then change the dressing every day with sterile gauze.  Please use good hand washing techniques before changing the dressing.  Do not use any lotions or creams on the  incision until instructed by your surgeon.  ACTIVITY  o Increase activity slowly as tolerated, but follow the weight bearing instructions below.   o No driving for 6 weeks or until further direction given by your physician.  You cannot drive while taking narcotics.  o No lifting or carrying greater than 10 lbs. until further directed by your surgeon. o Avoid periods of inactivity such as sitting longer than an hour when not  asleep. This helps prevent blood clots.  o You may return to work once you are authorized by your doctor.     WEIGHT BEARING   Weight bearing as tolerated with assist device (walker, cane, etc) as directed, use it as long as suggested by your surgeon or therapist, typically at least 4-6 weeks.   EXERCISES  Results after joint replacement surgery are often greatly improved when you follow the exercise, range of motion and muscle strengthening exercises prescribed by your doctor. Safety measures are also important to protect the joint from further injury. Any time any of these exercises cause you to have increased pain or swelling, decrease what you are doing until you are comfortable again and then slowly increase them. If you have problems or questions, call your caregiver or physical therapist for advice.   Rehabilitation is important following a joint replacement. After just a few days of immobilization, the muscles of the leg can become weakened and shrink (atrophy).  These exercises are designed to build up the tone and strength of the thigh and leg muscles and to improve motion. Often times heat used for twenty to thirty minutes before working out will loosen up your tissues and help with improving the range of motion but do not use heat for the first two weeks following surgery (sometimes heat can increase post-operative swelling).   These exercises can be done on a training (exercise) mat, on the floor, on a table or on a bed. Use whatever works the best and is most comfortable for you.    Use music or television while you are exercising so that the exercises are a pleasant break in your day. This will make your life better with the exercises acting as a break in your routine that you can look forward to.   Perform all exercises about fifteen times, three times per day or as directed.  You should exercise both the operative leg and the other leg as well.  Exercises include:    Quad Sets -  Tighten up the muscle on the front of the thigh (Quad) and hold for 5-10 seconds.    Straight Leg Raises - With your knee straight (if you were given a brace, keep it on), lift the leg to 60 degrees, hold for 3 seconds, and slowly lower the leg.  Perform this exercise against resistance later as your leg gets stronger.   Leg Slides: Lying on your back, slowly slide your foot toward your buttocks, bending your knee up off the floor (only go as far as is comfortable). Then slowly slide your foot back down until your leg is flat on the floor again.   Angel Wings: Lying on your back spread your legs to the side as far apart as you can without causing discomfort.   Hamstring Strength:  Lying on your back, push your heel against the floor with your leg straight by tightening up the muscles of your buttocks.  Repeat, but this time bend your knee to a comfortable angle, and push your heel against the  floor.  You may put a pillow under the heel to make it more comfortable if necessary.   A rehabilitation program following joint replacement surgery can speed recovery and prevent re-injury in the future due to weakened muscles. Contact your doctor or a physical therapist for more information on knee rehabilitation.    CONSTIPATION  Constipation is defined medically as fewer than three stools per week and severe constipation as less than one stool per week.  Even if you have a regular bowel pattern at home, your normal regimen is likely to be disrupted due to multiple reasons following surgery.  Combination of anesthesia, postoperative narcotics, change in appetite and fluid intake all can affect your bowels.   YOU MUST use at least one of the following options; they are listed in order of increasing strength to get the job done.  They are all available over the counter, and you may need to use some, POSSIBLY even all of these options:    Drink plenty of fluids (prune juice may be helpful) and high fiber  foods Colace 100 mg by mouth twice a day  Senokot for constipation as directed and as needed Dulcolax (bisacodyl), take with full glass of water  Miralax (polyethylene glycol) once or twice a day as needed.  If you have tried all these things and are unable to have a bowel movement in the first 3-4 days after surgery call either your surgeon or your primary doctor.    If you experience loose stools or diarrhea, hold the medications until you stool forms back up.  If your symptoms do not get better within 1 week or if they get worse, check with your doctor.  If you experience "the worst abdominal pain ever" or develop nausea or vomiting, please contact the office immediately for further recommendations for treatment.   ITCHING:  If you experience itching with your medications, try taking only a single pain pill, or even half a pain pill at a time.  You can also use Benadryl over the counter for itching or also to help with sleep.   TED HOSE STOCKINGS:  Use stockings on both legs until for at least 2 weeks or as directed by physician office. They may be removed at night for sleeping.  MEDICATIONS:  See your medication summary on the After Visit Summary that nursing will review with you.  You may have some home medications which will be placed on hold until you complete the course of blood thinner medication.  It is important for you to complete the blood thinner medication as prescribed.  PRECAUTIONS:  If you experience chest pain or shortness of breath - call 911 immediately for transfer to the hospital emergency department.   If you develop a fever greater that 101 F, purulent drainage from wound, increased redness or drainage from wound, foul odor from the wound/dressing, or calf pain - CONTACT YOUR SURGEON.                                                   FOLLOW-UP APPOINTMENTS:  If you do not already have a post-op appointment, please call the office for an appointment to be seen by your  surgeon.  Guidelines for how soon to be seen are listed in your After Visit Summary, but are typically between 1-4 weeks after surgery.  OTHER INSTRUCTIONS:  Knee Replacement:  Do not place pillow under knee, focus on keeping the knee straight while resting. CPM instructions: 0-90 degrees, 2 hours in the morning, 2 hours in the afternoon, and 2 hours in the evening. Place foam block, curve side up under heel at all times except when in CPM or when walking.  DO NOT modify, tear, cut, or change the foam block in any way.  MAKE SURE YOU:   Understand these instructions.   Get help right away if you are not doing well or get worse.    Thank you for letting us be a part of your medical care team.  It is a privilege we respect greatly.  We hope these instructions will help you stay on track for a fast and full recovery!

## 2015-06-14 NOTE — Op Note (Signed)
06/14/2015  10:19 AM  PATIENT:  Kara [name]    PRE-OPERATIVE DIAGNOSIS:  Osteoarthritis Right Knee, Retained Hardware Right Lateral Tibial Plateau  POST-OPERATIVE DIAGNOSIS:  Same  PROCEDURE:  Removal Internal Fixation Right Lateral Tibial Plateau, RIGHT TOTAL KNEE ARTHROPLASTY  SURGEON:  [name], MD  PHYSICIAN ASSISTANT:None ANESTHESIA:   General  PREOPERATIVE INDICATIONS:  Kara [name] is a  40 y.o. female with a diagnosis of Osteoarthritis Right Knee, Retained Hardware Right Lateral Tibial Plateau who failed conservative measures and elected for surgical management.    The risks benefits and alternatives were discussed with the patient preoperatively including but not limited to the risks of infection, bleeding, nerve injury, cardiopulmonary complications, the need for revision surgery, among others, and the patient was willing to proceed.  OPERATIVE IMPLANTS: [employer] sigma implants size 3 tibial revision tray size 3 femur 10 mm rotating platform polyethylene tray with a 32 mm patella  OPERATIVE FINDINGS: Tibial plateau fracture well-healed no lucency or weakness.  OPERATIVE PROCEDURE: Patient brought to operating room and underwent spinal anesthetic. After adequate anesthesia were obtained patient's right lower extremity was prepped using DuraPrep draped in a sterile field on skin was covered with Ioban. A timeout was called. A midline incision was made this was carried down to the medial parapatellar retinacular incision. A guidewire was used to insert from the screw threaded side to exit the screw head a small stab skin incision was made over the guidewire and all 3 screws were removed the without complications. Tension was then focused on the femur. 10 mm was taken off the distal femur this was set for 3 of valgus. Attention was then focused on the tibia. 10 mm was taken off the high side of the lateral tibial plateau with neutral varus and valgus with 3 of posterior slope.  Attention was then focused on the femur the femur sized for a size 3 and the chamfer cuts and box cuts were made for the size 3 femur. Attention was then focused on the tibia and the alignment was checked in the keel punch and drill was made for the 70 mm stem on the tibia. The trial components were placed the knee had full extension with a 10 mm polyethylene tray. The patella was resurfaced and 14 mm of patella was left with a 32 mm button and drill cuts were made for this. The trial components were removed the wound was irrigated with normal saline meniscal tissue and loose bodies were removed. The popliteal Hassell Done a was excised was injected with 20 mL of per L. After irrigation the knee the knee was soaked in trans-Amick acid. The knee was again irrigated and the cement and final components were prepared. The tibia and femoral component were cemented and impacted loose cement was removed and the true tibial tray was then inserted. The patella was inserted clamped and the clamps and the knee was left in extension until the cement hardened. All loose cement was removed. The patella was lateral tracking and a lateral release was performed. The patella tracked midline after the release. The retinaculum was closed using #1 Vicryl subcutaneous is closed using 0 Vicryl skin was closed using staples. A sterile compressive dressing was applied. Patient was then taken the PACU in stable condition.

## 2015-06-14 NOTE — Transfer of Care (Signed)
Immediate Anesthesia Transfer of Care Note  Patient: Joeleen Wortley  Procedure(s) Performed: Procedure(s): Removal Internal Fixation Right Lateral Tibial Plateau (Right) RIGHT TOTAL KNEE ARTHROPLASTY (Right)  Patient Location: PACU  Anesthesia Type:MAC and Spinal  Level of Consciousness: awake, alert  and oriented  Airway & Oxygen Therapy: Patient Spontanous Breathing  Post-op Assessment: Report given to RN and Post -op Vital signs reviewed and stable  Post vital signs: Reviewed and stable  Last Vitals:  Filed Vitals:   06/14/15 0645  BP: 117/53  Pulse: 74  Temp: 36.8 C  Resp: 20    Last Pain: There were no vitals filed for this visit.    Patients Stated Pain Goal: 5 (73/56/70 1410)  Complications: No apparent anesthesia complications

## 2015-06-14 NOTE — Anesthesia Postprocedure Evaluation (Signed)
Anesthesia Post Note  Patient: Kara [name]  Procedure(s) Performed: Procedure(s) (LRB): Removal Internal Fixation Right Lateral Tibial Plateau (Right) RIGHT TOTAL KNEE ARTHROPLASTY (Right)  Patient location during evaluation: PACU Anesthesia Type: General Level of consciousness: awake, awake and alert and oriented Pain management: pain level controlled Vital Signs Assessment: post-procedure vital signs reviewed and stable Respiratory status: spontaneous breathing, nonlabored ventilation and respiratory function stable Cardiovascular status: blood pressure returned to baseline Anesthetic complications: no    Last Vitals:  Filed Vitals:   06/14/15 1201 06/14/15 1211  BP: 109/68 107/45  Pulse: 76 69  Temp:  36.8 C  Resp: 14 17    Last Pain:  Filed Vitals:   06/14/15 1212  PainSc: 2                  Hanna Ra COKER

## 2015-06-15 ENCOUNTER — Encounter (HOSPITAL_COMMUNITY): Payer: Self-pay | Admitting: Orthopedic Surgery

## 2015-06-15 NOTE — Progress Notes (Signed)
Patient ID: Kara [name], female   DOB: 1975/10/21, 40 y.o.   MRN: [id number] Postoperative day 1 right total knee arthroplasty with removal of deep retained hardware. Patient is doing well this morning pain is moderate. Plan for physical therapy progressive ambulation discharged to home in a day or 2. The importance of knee extension exercises were discussed.

## 2015-06-15 NOTE — Progress Notes (Signed)
Physical Therapy Treatment Patient Details Name: Kara [name] MRN: [id number] DOB: 1975/08/24 Today's Date: 06/15/2015    History of Present Illness Pt is a 40 y/o female who presents s/p R TKA on 06/14/15. Pt also had hardware removed from prior tibial plateau fx.     PT Comments    Pt progressing towards physical therapy goals. Was able to perform transfers and ambulation with min guard assist for safety. Pt able to improve heel strike during gait training this session however continues to have poor coordination of step-through gait pattern. Pt anticipates d/c home tomorrow. Will continue to follow.  Follow Up Recommendations  Home health PT;Supervision for mobility/OOB     Equipment Recommendations  None recommended by PT    Recommendations for Other Services       Precautions / Restrictions Precautions Precautions: Fall;Knee Precaution Comments: Pt educated on towel roll under heel and NO pillow under knee.  Restrictions Weight Bearing Restrictions: Yes RLE Weight Bearing: Weight bearing as tolerated Other Position/Activity Restrictions: WBAT    Mobility  Bed Mobility   General bed mobility comments: Pt sitting up in recliner upon PT arrival.   Transfers Overall transfer level: Needs assistance Equipment used: Rolling walker (2 wheeled) Transfers: Sit to/from Stand Sit to Stand: Min guard         General transfer comment: Min guard assist for power-up to full standing position. VC's for hand placement on seated surface for safety.   Ambulation/Gait Ambulation/Gait assistance: Min guard Ambulation Distance (Feet): 130 Feet Assistive device: Rolling walker (2 wheeled) Gait Pattern/deviations: Step-through pattern;Decreased stride length;Decreased dorsiflexion - right Gait velocity: Decreased Gait velocity interpretation: Below normal speed for age/gender General Gait Details: VC's for improved posture, increased heel strike on the right and general  sequencing/safety with the RW.    Stairs Stairs: Yes Stairs assistance: Min guard Stair Management: No rails;Backwards;With walker Number of Stairs: 2 General stair comments: VC's for sequencing and safety. Min guard provided for safety and handout issued.  Wheelchair Mobility    Modified Rankin (Stroke Patients Only)       Balance Overall balance assessment: Needs assistance Sitting-balance support: Feet supported;No upper extremity supported Sitting balance-Leahy Scale: Fair     Standing balance support: Bilateral upper extremity supported;During functional activity Standing balance-Leahy Scale: Poor Standing balance comment: reliant on RW                    Cognition Arousal/Alertness: Awake/alert Behavior During Therapy: WFL for tasks assessed/performed Overall Cognitive Status: Within Functional Limits for tasks assessed                      Exercises Total Joint Exercises Ankle Circles/Pumps: 10 reps Quad Sets: 10 reps Short Arc Quad: 10 reps Heel Slides: 10 reps Hip ABduction/ADduction: 10 reps Long Arc Quad: 10 reps Knee Flexion: 10 reps Goniometric ROM: 72    General Comments        Pertinent Vitals/Pain Pain Assessment: Faces Pain Score: 8  Faces Pain Scale: Hurts even more Pain Location: Knee Pain Descriptors / Indicators: Operative site guarding;Sore Pain Intervention(s): Limited activity within patient's tolerance;Monitored during session;Repositioned    Home Living Family/patient expects to be discharged to:: Private residence Living Arrangements: Spouse/significant other Available Help at Discharge: Family;Available 24 hours/day (for a week) Type of Home: House Home Access: Stairs to enter Entrance Stairs-Rails: Right Home Layout: One level Home Equipment: Bedside commode;Crutches;Shower seat      Prior Function Level of Independence: Independent  PT Goals (current goals can now be found in the care plan  section) Acute Rehab PT Goals Patient Stated Goal: Home today PT Goal Formulation: With patient Time For Goal Achievement: 06/22/15 Potential to Achieve Goals: Good Progress towards PT goals: Progressing toward goals    Frequency  7X/week    PT Plan Current plan remains appropriate    Co-evaluation             End of Session Equipment Utilized During Treatment: Gait belt Activity Tolerance: Patient tolerated treatment well Patient left: in chair;with call bell/phone within reach     Time: 1330-1404 PT Time Calculation (min) (ACUTE ONLY): 34 min  Charges:  $Gait Training: 8-22 mins $Therapeutic Exercise: 8-22 mins                    G Codes:      Brigetta Beckstrom, Gertie Exon, PT, DPT Acute Rehabilitation Services Pager: [phone]

## 2015-06-15 NOTE — Care Management Note (Addendum)
Case Management Note  Patient Details  Name: Kara [name] MRN: [id number] Date of Birth: 1975/06/18  Subjective/Objective:      40 yr old female s/p right total knee arthroplasty with removal of hardware.               Action/Plan: Case manager spoke with patient concerning home health and DME needs. CM explained to patient that home health will be arranged through [facility], group that arranges [place] for [employer]. CM called [facility] with referral, faxed H&P, demographics and order to them @ [phone]. Intake [id number]. Start of care date will be 06/17/15.     Expected Discharge Date:   06/16/15               Expected Discharge Plan:  [place]  In-House Referral:     Discharge planning Services  CM Consult  Post Acute Care Choice:  Durable Medical Equipment, Home Health Choice offered to:     DME Arranged:  Walker rolling DME Agency:  [facility] [place]:  PT [facility] Agency:  Other - See comment (to be arranged by [place] has [employer])  Status of Service:  In process, will continue to follow  Medicare Important Message Given:    Date Medicare IM Given:    Medicare IM give by:    Date Additional Medicare IM Given:    Additional Medicare Important Message give by:     If discussed at [place] of Stay Meetings, dates discussed:    Additional Comments:Case manger received call that [facility] will be provided Home Health therapy, will start on 06/17/15.  Ninfa Meeker, RN 06/15/2015, 12:18 PM

## 2015-06-15 NOTE — Evaluation (Signed)
Physical Therapy Evaluation Patient Details Name: Kara [name] MRN: [id number] DOB: [birth year] Today's Date: 06/15/2015   History of Present Illness  Patient is a 40 year old woman status post RTKA--had right tibial plateau fracture with ORIF converted to this TKA.  PMH positive for gastric bypass, R knee scope and GERD.    Clinical Impression  Pt admitted with above diagnosis. Pt currently with functional limitations due to the deficits listed below (see PT Problem List). At the time of PT eval pt was able to perform transfers and ambulation with min guard to min assist for balance support and safety. Pt will benefit from skilled PT to increase their independence and safety with mobility to allow discharge to the venue listed below.       Follow Up Recommendations Home health PT;Supervision for mobility/OOB    Equipment Recommendations  None recommended by PT    Recommendations for Other Services       Precautions / Restrictions Precautions Precautions: Fall Precaution Comments: Pt educated on towel roll under heel and NO pillow under knee.  Restrictions Weight Bearing Restrictions: No RLE Weight Bearing: Weight bearing as tolerated Other Position/Activity Restrictions: WBAT      Mobility  Bed Mobility Overal bed mobility: Needs Assistance Bed Mobility: Supine to Sit     Supine to sit: Modified independent (Device/Increase time);HOB elevated (increased time)     General bed mobility comments: Min assist for initiation of RLE movement towards EOB. HOB raised and pt required rails to scoot all the way out.  Transfers Overall transfer level: Needs assistance Equipment used: Rolling walker (2 wheeled) Transfers: Sit to/from Stand Sit to Stand: Min guard         General transfer comment: Min assist initially progressing to min guard assist for sit<>stand. VC's for hand placement on seated surface for safety.   Ambulation/Gait Ambulation/Gait assistance: Min  guard Ambulation Distance (Feet): 100 Feet Assistive device: Rolling walker (2 wheeled) Gait Pattern/deviations: Step-through pattern;Decreased stride length;Trunk flexed Gait velocity: Decreased Gait velocity interpretation: Below normal speed for age/gender General Gait Details: VC's for improved posture, increased heel strike on the right and general sequencing/safety with the RW.   Stairs Stairs: Yes Stairs assistance: Min guard Stair Management: No rails;Backwards;With walker Number of Stairs: 2 General stair comments: VC's for sequencing and safety. Min guard provided for safety and handout issued.  Wheelchair Mobility    Modified Rankin (Stroke Patients Only)       Balance Overall balance assessment: Needs assistance Sitting-balance support: No upper extremity supported;Feet supported Sitting balance-Leahy Scale: Good     Standing balance support: Bilateral upper extremity supported;During functional activity Standing balance-Leahy Scale: Poor Standing balance comment: reliant on RW                             Pertinent Vitals/Pain Pain Assessment: 0-10 Pain Score: 8  Pain Location: right knee Pain Descriptors / Indicators: Grimacing;Guarding;Tender;Sore Pain Intervention(s): Limited activity within patient's tolerance;Monitored during session;Repositioned;Patient requesting pain meds-RN notified;RN gave pain meds during session;Ice applied    Home Living Family/patient expects to be discharged to:: Private residence Living Arrangements: Spouse/significant other Available Help at Discharge: Family;Available 24 hours/day (for a week) Type of Home: House Home Access: Stairs to enter Entrance Stairs-Rails: Right Entrance Stairs-Number of Steps: 3 Home Layout: One level Home Equipment: Bedside commode;Crutches;Shower seat      Prior Function Level of Independence: Independent  Hand Dominance   Dominant Hand: Right     Extremity/Trunk Assessment   Upper Extremity Assessment: Overall WFL for tasks assessed           Lower Extremity Assessment: Defer to PT evaluation RLE Deficits / Details: Decreased strength and AROM consistent with above mentioned procedure.     Cervical / Trunk Assessment: Normal  Communication   Communication: No difficulties  Cognition Arousal/Alertness: Awake/alert Behavior During Therapy: WFL for tasks assessed/performed Overall Cognitive Status: Within Functional Limits for tasks assessed                      General Comments      Exercises Total Joint Exercises Ankle Circles/Pumps: 10 reps Quad Sets: 10 reps Knee Flexion: 10 reps Goniometric ROM: 72      Assessment/Plan    PT Assessment Patient needs continued PT services  PT Diagnosis Difficulty walking;Acute pain   PT Problem List Decreased strength;Decreased range of motion;Decreased activity tolerance;Decreased balance;Decreased mobility;Decreased knowledge of use of DME;Decreased safety awareness;Decreased knowledge of precautions;Pain  PT Treatment Interventions DME instruction;Gait training;Stair training;Functional mobility training;Therapeutic activities;Therapeutic exercise;Neuromuscular re-education;Patient/family education   PT Goals (Current goals can be found in the Care Plan section) Acute Rehab PT Goals Patient Stated Goal: home later today or tomorrow more than likely PT Goal Formulation: With patient Time For Goal Achievement: 06/22/15 Potential to Achieve Goals: Good    Frequency 7X/week   Barriers to discharge        Co-evaluation               End of Session Equipment Utilized During Treatment: Gait belt Activity Tolerance: Patient tolerated treatment well Patient left: in chair;with call bell/phone within reach Nurse Communication: Mobility status         Time: 0300-9233 PT Time Calculation (min) (ACUTE ONLY): 32 min   Charges:   PT Evaluation $PT Eval  Moderate Complexity: 1 Procedure PT Treatments $Gait Training: 8-22 mins   PT G Codes:        Rolinda Roan 2015/06/22, 12:27 PM   Rolinda Roan, PT, DPT Acute Rehabilitation Services Pager: [phone]

## 2015-06-15 NOTE — Evaluation (Signed)
Occupational Therapy Evaluation and Discharge Patient Details Name: Blanka Rockholt MRN: [id number] DOB: 11-21-1975 Today's Date: 06/15/2015    History of Present Illness Patient is a 40 year old woman status post RTKA--had right tibial plateau fracture with ORIF converted to this TKA.  PMH positive for gastric bypass, R knee scope and GERD.     Clinical Impression   This 40 yo female admitted and underwent above presents to acute OT with all education completed. We will D/C from acute OT.    Follow Up Recommendations  No OT follow up    Equipment Recommendations  None recommended by OT       Precautions / Restrictions Precautions Precautions: Fall Restrictions Weight Bearing Restrictions: No Other Position/Activity Restrictions: WBAT      Mobility Bed Mobility Overal bed mobility: Needs Assistance Bed Mobility: Supine to Sit     Supine to sit: Modified independent (Device/Increase time);HOB elevated (increased time)        Transfers Overall transfer level: Needs assistance Equipment used: Rolling walker (2 wheeled) Transfers: Sit to/from Stand Sit to Stand: Min guard              Balance Overall balance assessment: Needs assistance Sitting-balance support: No upper extremity supported;Feet supported Sitting balance-Leahy Scale: Good     Standing balance support: Bilateral upper extremity supported;During functional activity Standing balance-Leahy Scale: Poor Standing balance comment: reliant on RW                            ADL Overall ADL's : Needs assistance/impaired Eating/Feeding: Independent;Sitting   Grooming: Set up;Sitting   Upper Body Bathing: Set up   Lower Body Bathing: Moderate assistance (with min guard A sit<>stand)   Upper Body Dressing : Set up;Sitting   Lower Body Dressing: Moderate assistance (with min guard A sit<>stand) Lower Body Dressing Details (indicate cue type and reason): reports she will have A prn for  LBADLs until she can do them by herself. I did educate her on the most efficient sequence of getting dressed Toilet Transfer: Min guard;Ambulation;RW (recliner>practice tub transfer to 3n1)   Toileting- Clothing Manipulation and Hygiene: Min guard;Sit to/from stand   Tub/ Shower Transfer: Tub transfer;Min guard;Ambulation;3 in 1   Functional mobility during ADLs: Min guard;Rolling walker                 Pertinent Vitals/Pain Pain Assessment: 0-10 Pain Score: 8  Pain Location: right knee Pain Descriptors / Indicators: Grimacing;Guarding;Tender;Sore Pain Intervention(s): Limited activity within patient's tolerance;Monitored during session;Repositioned;Patient requesting pain meds-RN notified;RN gave pain meds during session;Ice applied     Hand Dominance Right   Extremity/Trunk Assessment Upper Extremity Assessment Upper Extremity Assessment: Overall WFL for tasks assessed   Lower Extremity Assessment Lower Extremity Assessment: Defer to PT evaluation       Communication Communication Communication: No difficulties   Cognition Arousal/Alertness: Awake/alert Behavior During Therapy: WFL for tasks assessed/performed Overall Cognitive Status: Within Functional Limits for tasks assessed                                Home Living Family/patient expects to be discharged to:: Private residence Living Arrangements: Spouse/significant other Available Help at Discharge: Family;Available 24 hours/day (for a week) Type of Home: House Home Access: Stairs to enter [employer] of Steps: 3 Entrance Stairs-Rails: Right Home Layout: One level     Bathroom Shower/Tub: Corporate investment banker:  Handicapped height Bathroom Accessibility: Yes   Home Equipment: Bedside commode;Crutches;Shower seat          Prior Functioning/Environment Level of Independence: Independent             OT Diagnosis: Generalized weakness;Acute pain          OT Goals(Current goals can be found in the care plan section) Acute Rehab OT Goals Patient Stated Goal: home later today or tomorrow more than likely  OT Frequency:                End of Session Equipment Utilized During Treatment: Gait belt;Rolling walker Nurse Communication: Patient requests pain meds  Activity Tolerance: Patient tolerated treatment well Patient left: in chair;with call bell/phone within reach   Time: 1128-1200 OT Time Calculation (min): 32 min Charges:  OT General Charges $OT Visit: 1 Procedure OT Evaluation $OT Eval Low Complexity: 1 Procedure OT Treatments $Self Care/Home Management : 8-22 mins  Almon Register [phone] 06/15/2015, 12:17 PM

## 2015-06-16 MED ORDER — RIVAROXABAN 15 MG PO TABS: 15.0000 mg | ORAL_TABLET | Freq: Every day | ORAL | Status: DC

## 2015-06-16 MED ORDER — OXYCODONE-ACETAMINOPHEN 5-325 MG PO TABS
1.0000 | ORAL_TABLET | ORAL | Status: DC | PRN
Start: 2015-06-16 — End: 2016-03-13

## 2015-06-16 NOTE — Progress Notes (Signed)
Physical Therapy Treatment Patient Details Name: Kara [name] MRN: [id number] DOB: 27-Feb-1975 Today's Date: 06/16/2015    History of Present Illness Pt is a 40 y/o female who presents s/p R TKA on 06/14/15. Pt also had hardware removed from prior tibial plateau fx.     PT Comments    Pt progressing towards physical therapy goals. Was able to progress gait training this session with improvement in heel strike and step-through. Pt was instructed in HEP adding in gastroc stretch. Pt anticipates d/c home today.   Follow Up Recommendations  Home health PT;Supervision for mobility/OOB     Equipment Recommendations  Rolling walker with 5" wheels;3in1 (PT)    Recommendations for Other Services       Precautions / Restrictions Precautions Precautions: Fall;Knee Precaution Booklet Issued: Yes (comment) Precaution Comments: Pt educated on towel roll under heel and NO pillow under knee.  Restrictions Weight Bearing Restrictions: Yes RLE Weight Bearing: Weight bearing as tolerated    Mobility  Bed Mobility Overal bed mobility: Needs Assistance Bed Mobility: Sit to Supine     Supine to sit: Supervision Sit to supine: Min assist   General bed mobility comments: Pt unable to elevate RLE up onto bed even with assist from LLE. Min assist provided to achieve.  Transfers Overall transfer level: Needs assistance Equipment used: Rolling walker (2 wheeled) Transfers: Sit to/from Stand Sit to Stand: Supervision         General transfer comment: Pt demonstrated proper hand placement on seated surface for safety.   Ambulation/Gait Ambulation/Gait assistance: Supervision Ambulation Distance (Feet): 200 Feet Assistive device: Rolling walker (2 wheeled) Gait Pattern/deviations: Step-through pattern;Decreased stride length;Trunk flexed Gait velocity: Decreased Gait velocity interpretation: Below normal speed for age/gender General Gait Details: Pre-gait of 2x10 standing terminal knee  extensions (once before gait and once after). Pt with difficulty keeping heel down during swing through of LLE due to gastroc tightness. Progressing towards step-through gait pattern.   Stairs         General stair comments: Pt declined stair training this afternoon  Wheelchair Mobility    Modified Rankin (Stroke Patients Only)       Balance Overall balance assessment: Needs assistance Sitting-balance support: Feet supported;No upper extremity supported Sitting balance-Leahy Scale: Fair     Standing balance support: No upper extremity supported;During functional activity Standing balance-Leahy Scale: Fair Standing balance comment: Able to perform static standing activity without UE support                    Cognition Arousal/Alertness: Awake/alert Behavior During Therapy: WFL for tasks assessed/performed Overall Cognitive Status: Within Functional Limits for tasks assessed                      Exercises Total Joint Exercises Heel Slides: 10 reps;AAROM;Supine Hip ABduction/ADduction: 10 reps;Supine;AAROM Straight Leg Raises: 5 reps;AAROM Goniometric ROM: 78 Other Exercises Other Exercises: 2x10 terminal knee extension in standing Other Exercises: 3x30" hold gastroc stretch with sheet in supine    General Comments        Pertinent Vitals/Pain Pain Assessment: Faces Faces Pain Scale: Hurts little more Pain Location: knee Pain Descriptors / Indicators: Operative site guarding;Sore Pain Intervention(s): Limited activity within patient's tolerance;Monitored during session;Repositioned    Home Living                      Prior Function            PT Goals (current goals can  now be found in the care plan section) Acute Rehab PT Goals Patient Stated Goal: Home today PT Goal Formulation: With patient Time For Goal Achievement: 06/22/15 Potential to Achieve Goals: Good Progress towards PT goals: Progressing toward goals     Frequency  7X/week    PT Plan Current plan remains appropriate    Co-evaluation             End of Session Equipment Utilized During Treatment: Gait belt Activity Tolerance: Patient tolerated treatment well Patient left: in chair;with call bell/phone within reach     Time: 4388-8757 PT Time Calculation (min) (ACUTE ONLY): 33 min  Charges:  $Gait Training: 8-22 mins $Therapeutic Exercise: 8-22 mins                    G Codes:      Rolinda Roan 07-04-15, 2:25 PM  Rolinda Roan, PT, DPT Acute Rehabilitation Services Pager: [phone]

## 2015-06-16 NOTE — Progress Notes (Signed)
Physical Therapy Treatment Patient Details Name: Kara [name] MRN: [id number] DOB: 1975/10/30 Today's Date: 06/16/2015    History of Present Illness Pt is a 40 y/o female who presents s/p R TKA on 06/14/15. Pt also had hardware removed from prior tibial plateau fx.     PT Comments    Pt progressing towards physical therapy goals. Was able to perform transfers and ambulation at a min guard to supervision level. Pt continues to have difficulty with gait training technique and would benefit from another PT session prior to d/c.  Follow Up Recommendations  Home health PT;Supervision for mobility/OOB     Equipment Recommendations  None recommended by PT    Recommendations for Other Services       Precautions / Restrictions Precautions Precautions: Fall;Knee Precaution Comments: Pt educated on towel roll under heel and NO pillow under knee.  Restrictions Weight Bearing Restrictions: Yes RLE Weight Bearing: Weight bearing as tolerated    Mobility  Bed Mobility Overal bed mobility: Needs Assistance Bed Mobility: Supine to Sit     Supine to sit: Supervision     General bed mobility comments: VC's for using LLE to assist RLE in advancing towards EOB. Pt required increased time and appeared effortful for pt to complete.   Transfers Overall transfer level: Needs assistance Equipment used: Rolling walker (2 wheeled) Transfers: Sit to/from Stand Sit to Stand: Min guard         General transfer comment: Min guard assist for power-up to full standing position. VC's for hand placement on seated surface for safety.   Ambulation/Gait Ambulation/Gait assistance: Min guard;Supervision Ambulation Distance (Feet): 150 Feet Assistive device: Rolling walker (2 wheeled) Gait Pattern/deviations: Step-to pattern;Step-through pattern;Decreased stride length;Trunk flexed Gait velocity: Decreased Gait velocity interpretation: Below normal speed for age/gender General Gait Details: VC's  for improved posture, increased heel strike on the right and general sequencing/safety with the RW. Pt with difficulty progressing to step-through gait pattern.   Stairs            Wheelchair Mobility    Modified Rankin (Stroke Patients Only)       Balance Overall balance assessment: Needs assistance Sitting-balance support: Feet supported;No upper extremity supported Sitting balance-Leahy Scale: Fair     Standing balance support: Bilateral upper extremity supported;During functional activity Standing balance-Leahy Scale: Fair Standing balance comment: Able to perform static standing activity without UE support                    Cognition Arousal/Alertness: Awake/alert Behavior During Therapy: WFL for tasks assessed/performed Overall Cognitive Status: Within Functional Limits for tasks assessed                      Exercises Total Joint Exercises Heel Slides: 10 reps Goniometric ROM: 78    General Comments        Pertinent Vitals/Pain Pain Assessment: Faces Faces Pain Scale: Hurts little more Pain Location: knee Pain Descriptors / Indicators: Operative site guarding;Sore Pain Intervention(s): Limited activity within patient's tolerance;Monitored during session;Repositioned    Home Living                      Prior Function            PT Goals (current goals can now be found in the care plan section) Acute Rehab PT Goals Patient Stated Goal: Home today PT Goal Formulation: With patient Time For Goal Achievement: 06/22/15 Potential to Achieve Goals: Good Progress towards PT goals:  Progressing toward goals    Frequency  7X/week    PT Plan Current plan remains appropriate    Co-evaluation             End of Session Equipment Utilized During Treatment: Gait belt Activity Tolerance: Patient tolerated treatment well Patient left: in chair;with call bell/phone within reach     Time: 1607-3710 PT Time Calculation  (min) (ACUTE ONLY): 33 min  Charges:  $Gait Training: 23-37 mins                    G Codes:      Rolinda Roan 2015-07-04, 12:10 PM   Rolinda Roan, PT, DPT Acute Rehabilitation Services Pager: [phone]

## 2015-06-16 NOTE — Discharge Summary (Signed)
Physician Discharge Summary  Patient ID: Kara [name] MRN: [id number] DOB/AGE: 1975/04/29 40 y.o.  Admit date: 06/14/2015 Discharge date: 06/16/2015  Admission Diagnoses:Osteoarthritis right knee  Discharge Diagnoses:  Active Problems:   Total knee replacement status   Discharged Condition: stable  Hospital Course: Patient's hospital course was essentially unremarkable. She underwent removal of deep retained hardware and total knee replacement on the right postoperatively she progressed well and was discharged to home in stable condition.  Consults: None  Significant Diagnostic Studies: labs: Routine labs  Treatments: surgery: See operative note  Discharge Exam: Blood pressure 111/58, pulse 90, temperature 99.1 F (37.3 C), temperature source Oral, resp. rate 15, height 5' 7"  (1.702 m), weight 97.523 kg (215 lb), SpO2 98 %. Incision/Wound: Dressing clean and dry  Disposition: 06-Home-Health Care Svc     Medication List    ASK your doctor about these medications        acetaminophen 325 MG tablet  Commonly known as:  TYLENOL  Take 650 mg by mouth every 6 (six) hours as needed for mild pain or moderate pain.     BIOFREEZE 10.5 % Aero  Generic drug:  Menthol  Apply topically.     calcium carbonate 1250 (500 Ca) MG tablet  Commonly known as:  OS-CAL - dosed in mg of elemental calcium  Take 1 tablet by mouth 3 (three) times daily with meals.     FLINSTONES GUMMIES OMEGA-3 DHA Chew  Chew 1 each by mouth 2 (two) times daily.     glucosamine-chondroitin 500-400 MG tablet  Take 1 tablet by mouth daily.     levothyroxine 200 MCG tablet  Commonly known as:  SYNTHROID, LEVOTHROID  TAKE ONE TABLET BY MOUTH ONCE DAILY     medroxyPROGESTERone 150 MG/ML injection  Commonly known as:  DEPO-PROVERA  Inject 150 mg into the muscle every 3 (three) months. Last dose was 12/10/12     traMADol 50 MG tablet  Commonly known as:  ULTRAM  Take 1 tablet (50 mg total) by mouth 3  (three) times daily as needed.     venlafaxine 37.5 MG tablet  Commonly known as:  EFFEXOR  TAKE ONE TABLET BY MOUTH TWICE DAILY     vitamin B-12 500 MCG tablet  Commonly known as:  CYANOCOBALAMIN  Take 500 mcg by mouth daily.           Follow-up Information    Follow up with Lidya Mccalister V, MD In 1 week.   Specialty:  Orthopedic Surgery   Contact information:   [place] [address] [phone]       Signed: [name] 06/16/2015, 6:12 AM

## 2015-06-16 NOTE — Progress Notes (Signed)
Went over discharge papers and medications with patient, with full understanding

## 2015-07-13 ENCOUNTER — Other Ambulatory Visit: Payer: Self-pay | Admitting: Family Medicine

## 2015-07-16 NOTE — Telephone Encounter (Signed)
Yes thanks, may fill 

## 2015-07-17 ENCOUNTER — Other Ambulatory Visit: Payer: Self-pay | Admitting: Family Medicine

## 2015-07-17 NOTE — Telephone Encounter (Signed)
Rx phoned in.   

## 2015-08-21 ENCOUNTER — Ambulatory Visit: Payer: Managed Care, Other (non HMO) | Admitting: Dietician

## 2015-10-03 ENCOUNTER — Other Ambulatory Visit: Payer: Self-pay | Admitting: Family Medicine

## 2015-10-04 ENCOUNTER — Other Ambulatory Visit: Payer: Self-pay | Admitting: Family Medicine

## 2015-10-05 ENCOUNTER — Encounter: Payer: Self-pay | Admitting: Family Medicine

## 2015-10-06 ENCOUNTER — Other Ambulatory Visit: Payer: Self-pay

## 2015-10-06 MED ORDER — TRAMADOL HCL 50 MG PO TABS: 50.0000 mg | ORAL_TABLET | Freq: Three times a day (TID) | ORAL | 0 refills | Status: DC | PRN

## 2015-10-09 ENCOUNTER — Telehealth: Payer: Self-pay | Admitting: Family Medicine

## 2015-10-09 ENCOUNTER — Other Ambulatory Visit: Payer: Self-pay | Admitting: Family Medicine

## 2015-10-09 ENCOUNTER — Other Ambulatory Visit: Payer: Self-pay

## 2015-10-09 MED ORDER — TRAMADOL HCL 50 MG PO TABS: 50.0000 mg | ORAL_TABLET | Freq: Three times a day (TID) | ORAL | 0 refills | Status: DC | PRN

## 2015-10-09 NOTE — Telephone Encounter (Signed)
Resent via fax to [facility]

## 2015-10-09 NOTE — Telephone Encounter (Signed)
Pt has only one tramadol left and pharm never received rx. Please call [facility]

## 2015-10-10 NOTE — Telephone Encounter (Signed)
° ° °  Pt contacted [facility] and they told her they did not have no rx for her.    TRAMADOL

## 2015-11-14 ENCOUNTER — Encounter (HOSPITAL_COMMUNITY): Payer: Self-pay

## 2015-11-20 ENCOUNTER — Ambulatory Visit (INDEPENDENT_AMBULATORY_CARE_PROVIDER_SITE_OTHER): Payer: Self-pay | Admitting: Orthopedic Surgery

## 2015-12-04 ENCOUNTER — Ambulatory Visit (INDEPENDENT_AMBULATORY_CARE_PROVIDER_SITE_OTHER): Payer: Self-pay | Admitting: Orthopedic Surgery

## 2015-12-04 ENCOUNTER — Encounter (INDEPENDENT_AMBULATORY_CARE_PROVIDER_SITE_OTHER): Payer: Self-pay | Admitting: Orthopedic Surgery

## 2015-12-04 VITALS — Ht 67.0 in | Wt 215.0 lb

## 2015-12-04 DIAGNOSIS — M1731 Unilateral post-traumatic osteoarthritis, right knee: Secondary | ICD-10-CM

## 2015-12-04 NOTE — Progress Notes (Signed)
Office Visit Note   Patient: Kara [name]           Date of Birth: Jun 26, 1975           MRN: [id number] Visit Date: 12/04/2015              Requested by: Marin Olp, MD [address], [place] [address] PCP: Garret Reddish, MD   Assessment & Plan: Visit Diagnoses:  1. Post-traumatic osteoarthritis of right knee     Plan: Follow-up in the office as needed continue with her strengthening. Patient has made excellent improvement in her range of motion of her knee and will continue with her therapy. Range of motion 5-110.  Follow-Up Instructions: Return if symptoms worsen or fail to improve.   Orders:  No orders of the defined types were placed in this encounter.  No orders of the defined types were placed in this encounter.     Procedures: No procedures performed   Clinical Data: No additional findings.   Subjective: Chief Complaint  Patient presents with  . Right Knee - Follow-up  . Follow-up    right total knee arthroplasty s/p tibial plateau fracture 05/2015    Patient is here for 4 week follow up of right total knee arthroplasty s/p tibial plateau fracture. She is approximately 6 months post op. She has completed her formal physical therapy. She is currently using a joint active systems (JAS) machine at home and exercising leg daily. She is taking tramadol on prn basis for her pain.     Review of Systems   Objective: Vital Signs: Ht 5' 7"  (1.702 m)   Wt 215 lb (97.5 kg)   BMI 33.67 kg/m   Physical Exam Patient is alert oriented no adenopathy well-dressed normal affect normal respiratory effort.  Patient does have an antalgic gait she has range of motion from 5-110 she is quite pleased with her improvement in range of motion the incision is well healed she will continue with scar massage continue with extension exercises continue with her strengthening exercises for a year. There is no redness no cellulitis no effusion no signs of  infection.  Ortho Exam  Specialty Comments:  No specialty comments available.  Imaging: No results found.   PMFS History: Patient Active Problem List   Diagnosis Date Noted  . 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 Tibial plateau fracture 03/10/2015  . Allergic rhinitis 02/03/2014  . Vitamin D deficiency 02/03/2014  . BPPV (benign paroxysmal positional vertigo) 04/19/2013  . Osteoarthritis of both knees 04/19/2013  . 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 Depression 02/22/2010  . DJD (degenerative joint disease) 10/26/2009  . OBESITY 06/16/2008  . Hyperlipidemia 06/11/2007  . Hypothyroidism 08/18/2006   Past Medical History:  Diagnosis Date  . ANXIETY 08/18/2006  . 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 DEPRESSION 02/22/2010  . GERD (gastroesophageal reflux disease)    history of  . HYPERLIPIDEMIA 06/11/2007  . HYPOTHYROIDISM 08/18/2006  . OBESITY 06/16/2008  . Pneumonia    hx of   . PONV (postoperative nausea and vomiting)    used Scopolamine patch    Family History  Problem Relation Age of Onset  . Schizophrenia Mother   . Cancer Mother     lung, smoker  . Breast cancer Mother 47  . Uterine cancer Mother   . Heart disease Father 13    smoker at time    Past Surgical History:  Procedure Laterality Date  . CHOLECYSTECTOMY  N/A 05/08/2012   Procedure: LAPAROSCOPIC CHOLECYSTECTOMY WITH INTRAOPERATIVE CHOLANGIOGRAM;  Surgeon: Leighton Ruff, MD;  Location: [facility];  Service: General;  Laterality: N/A;  . FRACTURE SURGERY    . GASTRIC ROUX-EN-Y  12/29/2012   Procedure: LAPAROSCOPIC ROUX-EN-Y GASTRIC BYPASS WITH UPPER ENDOSCOPY;  Surgeon: Madilyn Hook, DO;  Location: [facility];  Service: General;;  . HARDWARE REMOVAL Right 06/14/2015   Procedure: Removal Internal Fixation Right Lateral Tibial Plateau;  Surgeon: [name], MD;  Location: [facility];  Service: Orthopedics;  Laterality: Right;  . HERNIA REPAIR    . KNEE ARTHROSCOPY Right 07/28/12   dr.  Sharol Given  . ORIF TIBIA PLATEAU Right 03/10/2015   Procedure: OPEN REDUCTION INTERNAL FIXATION (ORIF) LATERAL TIBIAL PLATEAU;  Surgeon: [name], MD;  Location: [place];  Service: Orthopedics;  Laterality: Right;  . ORIF ULNAR FRACTURE Right 1995   plates and pins  . TOTAL KNEE ARTHROPLASTY Right 06/14/2015  . TOTAL KNEE ARTHROPLASTY Right 06/14/2015   Procedure: RIGHT TOTAL KNEE ARTHROPLASTY;  Surgeon: [name], MD;  Location: [facility];  Service: Orthopedics;  Laterality: Right;  . UPPER GI ENDOSCOPY  12/29/2012   Procedure: UPPER GI ENDOSCOPY;  Surgeon: Madilyn Hook, DO;  Location: [facility];  Service: General;;   Social History   Occupational History  . Not on file.   Social History Main Topics  . Smoking status: Former Smoker    Types: Cigarettes    Quit date: 01/28/2005  . Smokeless tobacco: Never Used  . Alcohol use No     Comment: not since gastric bypass  . Drug use: No  . Sexual activity: Not on file

## 2015-12-10 ENCOUNTER — Other Ambulatory Visit: Payer: Self-pay | Admitting: Family Medicine

## 2016-01-09 ENCOUNTER — Other Ambulatory Visit: Payer: Self-pay | Admitting: Family Medicine

## 2016-01-10 ENCOUNTER — Other Ambulatory Visit: Payer: Self-pay | Admitting: Family Medicine

## 2016-01-11 ENCOUNTER — Telehealth: Payer: Self-pay | Admitting: Family Medicine

## 2016-01-11 NOTE — Telephone Encounter (Signed)
Yes thanks but please call patient and encourage her to schedule a follow up appointment or CPE within next few months

## 2016-01-11 NOTE — Telephone Encounter (Signed)
° ° ° ° ° °  Pt request refill of the following:   traMADol (ULTRAM) 50 MG tablet   Phamacy:

## 2016-01-15 ENCOUNTER — Other Ambulatory Visit: Payer: Self-pay | Admitting: Family Medicine

## 2016-03-05 DIAGNOSIS — Z6841 Body Mass Index (BMI) 40.0 and over, adult: Secondary | ICD-10-CM | POA: Diagnosis not present

## 2016-03-05 DIAGNOSIS — Z01419 Encounter for gynecological examination (general) (routine) without abnormal findings: Secondary | ICD-10-CM | POA: Diagnosis not present

## 2016-03-05 LAB — HM PAP SMEAR

## 2016-03-13 ENCOUNTER — Encounter: Payer: Self-pay | Admitting: Family Medicine

## 2016-03-13 ENCOUNTER — Ambulatory Visit (INDEPENDENT_AMBULATORY_CARE_PROVIDER_SITE_OTHER): Payer: BLUE CROSS/BLUE SHIELD | Admitting: Family Medicine

## 2016-03-13 VITALS — BP 128/80 | HR 85 | Temp 97.9°F | Ht 64.75 in | Wt 251.2 lb

## 2016-03-13 DIAGNOSIS — Z7251 High risk heterosexual behavior: Secondary | ICD-10-CM | POA: Diagnosis not present

## 2016-03-13 DIAGNOSIS — E785 Hyperlipidemia, unspecified: Secondary | ICD-10-CM

## 2016-03-13 DIAGNOSIS — Z9884 Bariatric surgery status: Secondary | ICD-10-CM

## 2016-03-13 DIAGNOSIS — E034 Atrophy of thyroid (acquired): Secondary | ICD-10-CM | POA: Diagnosis not present

## 2016-03-13 DIAGNOSIS — Z0000 Encounter for general adult medical examination without abnormal findings: Secondary | ICD-10-CM

## 2016-03-13 MED ORDER — CITALOPRAM HYDROBROMIDE 20 MG PO TABS: 20.0000 mg | ORAL_TABLET | Freq: Every day | ORAL | 3 refills | Status: DC

## 2016-03-13 NOTE — Patient Instructions (Addendum)
Start half of celexa 35m tomorrow Reduce effexor to 1/2 tablet twice a day for 3 weeks.   In 3 weeks- increase to full celexa 246mReduce effexor to 1/2 tablet in the morning only  After 2 weeks, stop the effexor   Sign release of information at the check out desk for last pap smear  If ear does not continue to improve or worsens, we could do a referral to Ear, nose and throat doctors for you  There are medicines for restless legs we could consider but want to make sure iron not low first  Please stop by lab before you go

## 2016-03-13 NOTE — Assessment & Plan Note (Signed)
Depression- has done well on effexor 37.45m BID but is very expensive now at $90. Will change to celexa 291m Titration per avs. Warned of difficulty of wean for effexor

## 2016-03-13 NOTE — Progress Notes (Signed)
Pre visit review using our clinic review tool, if applicable. No additional management support is needed unless otherwise documented below in the visit note. 

## 2016-03-13 NOTE — Progress Notes (Signed)
Phone: [phone]  Subjective:  Patient presents today for their annual physical. Chief complaint-noted.   See problem oriented charting- ROS- full  review of systems was completed and negative except for: left knee and right hip pain  The following were reviewed and entered/updated in epic: Past Medical History:  Diagnosis Date  . ANXIETY 08/18/2006  . 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 DEPRESSION 02/22/2010  . GERD (gastroesophageal reflux disease)    history of  . HYPERLIPIDEMIA 06/11/2007  . HYPOTHYROIDISM 08/18/2006  . OBESITY 06/16/2008  . Pneumonia    hx of   . PONV (postoperative nausea and vomiting)    used Scopolamine patch   Patient Active Problem List   Diagnosis Date Noted  . 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 Depression 02/22/2010    Priority: Medium  . OBESITY 06/16/2008    Priority: Medium  . Hyperlipidemia 06/11/2007    Priority: Medium  . Hypothyroidism 08/18/2006    Priority: Medium  . Allergic rhinitis 02/03/2014    Priority: Low  . Vitamin D deficiency 02/03/2014    Priority: Low  . BPPV (benign paroxysmal positional vertigo) 04/19/2013    Priority: Low  . Osteoarthritis of both knees 04/19/2013    Priority: Low  . DJD (degenerative joint disease) 10/26/2009    Priority: Low   Past Surgical History:  Procedure Laterality Date  . CHOLECYSTECTOMY N/A 05/08/2012   Procedure: LAPAROSCOPIC CHOLECYSTECTOMY WITH INTRAOPERATIVE CHOLANGIOGRAM;  Surgeon: Leighton Ruff, MD;  Location[facility];  Service: General;  Laterality: N/A;  . FRACTURE SURGERY    . GASTRIC ROUX-EN-Y  12/29/2012   Procedure: LAPAROSCOPIC ROUX-EN-Y GASTRIC BYPASS WITH UPPER ENDOSCOPY;  Surgeon: Madilyn Hook, DO;  Location[facility];  Service: General;;  . HARDWARE REMOVAL Right 06/14/2015   Procedure: Removal Internal Fixation Right Lateral Tibial Plateau;  Surgeon: [name],  MD;  Location: [place];  Service: Orthopedics;  Laterality: Right;  . HERNIA REPAIR    . KNEE ARTHROSCOPY Right 07/28/12   dr. Sharol Given  . ORIF TIBIA PLATEAU Right 03/10/2015   Procedure: OPEN REDUCTION INTERNAL FIXATION (ORIF) LATERAL TIBIAL PLATEAU;  Surgeon: [name], MD;  Location: [facility];  Service: Orthopedics;  Laterality: Right;  . ORIF ULNAR FRACTURE Right 1995   plates and pins  . TOTAL KNEE ARTHROPLASTY Right 06/14/2015  . TOTAL KNEE ARTHROPLASTY Right 06/14/2015   Procedure: RIGHT TOTAL KNEE ARTHROPLASTY;  Surgeon: [name], MD;  Location: [facility];  Service: Orthopedics;  Laterality: Right;  . UPPER GI ENDOSCOPY  12/29/2012   Procedure: UPPER GI ENDOSCOPY;  Surgeon: Madilyn Hook, DO;  Location[facility];  Service: General;;    Family History  Problem Relation Age of Onset  . Schizophrenia Mother   . Cancer Mother     lung, smoker  . Breast cancer Mother 20  . Uterine cancer Mother   . Heart disease Father 69    smoker at time    Medications- reviewed and updated Current Outpatient Prescriptions  Medication Sig Dispense Refill  . acetaminophen (TYLENOL) 325 MG tablet Take 650 mg by mouth every 6 (six) hours as needed for mild pain or moderate pain.    . calcium carbonate (OS-CAL - DOSED IN MG OF ELEMENTAL CALCIUM) 1250 (500 Ca) MG tablet Take 1 tablet by mouth 3 (three) times daily with meals.    Marland Kitchen glucosamine-chondroitin 500-400 MG tablet Take  1 tablet by mouth daily.    Marland Kitchen levothyroxine (SYNTHROID, LEVOTHROID) 200 MCG tablet TAKE ONE TABLET BY MOUTH ONCE DAILY 30 tablet 5  . medroxyPROGESTERone (DEPO-PROVERA) 150 MG/ML injection Inject 150 mg into the muscle every 3 (three) months. Last dose was 12/10/12    . Menthol (BIOFREEZE) 10.5 % AERO Apply topically.    . Pediatric Multiple Vit-C-FA (FLINSTONES GUMMIES OMEGA-3 DHA) CHEW Chew 1 each by mouth 2 (two) times daily.     . traMADol (ULTRAM) 50 MG tablet TAKE ONE TABLET BY MOUTH THREE TIMES DAILY AS NEEDED. 90 tablet 1  .  vitamin B-12 (CYANOCOBALAMIN) 500 MCG tablet Take 500 mcg by mouth daily.    . citalopram (CELEXA) 20 MG tablet Take 1 tablet (20 mg total) by mouth daily. 90 tablet 3   No current facility-administered medications for this visit.     Allergies-reviewed and updated Allergies  Allergen Reactions  . Asa [Aspirin] Itching and Swelling    Social History   Social History  . Marital status: Married    Spouse name: N/A  . Number of children: N/A  . Years of education: N/A   Social History Main Topics  . Smoking status: Former Smoker    Types: Cigarettes    Quit date: 01/28/2005  . Smokeless tobacco: Never Used  . Alcohol use No     Comment: not since gastric bypass  . Drug use: No  . Sexual activity: Not Asked   Other Topics Concern  . None   Social History Narrative   Married (husband transgender female to female). No children. Several pets-1 dog, 4 cats, 2 [place] pigs.       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 Oropharynx normal Neck: no thyromegaly CV: RRR no murmurs rubs or gallops Lungs: CTAB no crackles, wheeze, rhonchi Abdomen: soft/nontender/nondistended/normal bowel sounds. Morbid obesity Right knee s/p surgery with scar noted Ext: no pitting edema Skin: warm, dry Neuro: grossly normal, moves all extremities, PERRLA  Assessment/Plan:  41 y.o. female presenting for annual physical.  Health Maintenance counseling: 1. Anticipatory guidance: Patient counseled regarding regular dental exams q6 months, eye exams - yearly, wearing seatbelts.  2. Risk factor reduction:  Advised patient of need for regular exercise and diet  rich and fruits and vegetables to reduce risk of heart attack and stroke. Exercise- starting back. Diet-diet has been stable- she is really dependent on exercise.  Wt Readings from Last 3 Encounters:  03/13/16 251 lb 3.2 oz (113.9 kg)  12/04/15 215 lb (97.5 kg)  06/14/15 215 lb (97.5 kg)  3. Immunizations/screenings/ancillary studies Immunization History  Administered Date(s) Administered  . Influenza Split 11/08/2010  . Influenza Whole 10/29/2006  . Influenza,inj,Quad PF,36+ Mos 10/20/2012, 11/11/2013, 10/10/2014  . Td 12/18/2006   Health Maintenance Due  Topic Date Due  . HIV Screening - HIV 03/20/1988  . INFLUENZA VACCINE - declined 08/29/2015   4. Cervical cancer screening- had 2015 but also last year 5. Breast cancer screening-  breast exam with gyn and mammogram - scheduled 26th of february 6. Colon cancer screening -  no family history, start at age 19 7. Skin cancer screening- wears sunscreen regularly, no spots she is worried about  Status of chronic or acute concerns  History of gastric bypass- compliant with b12, flinstones MV, calcium.  Weight has sprung up to 250 from 215 last visit. Just now getting back to her full cardio workouts- she is well aware of the change.   Hypothyroidism- will need to update to make sure not contributing. On 200 mcg levothyroxine Lab Results  Component Value Date   TSH 3.24 06/05/2015   HLD- plan has bene diet/exercise- no rx at present  OA- right knee much better after knee replacement. Left knee and right hip still bother her.   Left leg twitching starting a month after surgery when in bed. Resolves if gets out of bed or moves leg it feels better briefly.   2 weeks ago had a virus- still cannot hear great from the right ear quite as well but slowly improving  For  annual labs given bariatric sugery status: CBC, CMP, iron, ferritin, b12, vit D, PTH, b1, folate, zinc, copper.    Plus lipid and HIV.  She is not fasting- so sugar and  lipid may be up some  Hyperlipidemia Depression- has done well on effexor 37.59m BID but is very expensive now at $90. Will change to celexa 224m Titration per avs. Warned of difficulty of wean for effexor   3 month check in given change in medicine, 1 year physical  Orders Placed This Encounter  Procedures  . CBC    [place]  . Comprehensive metabolic panel    [place]    Order Specific Question:   Has the patient fasted?    Answer:   No  . Iron and TIBC  . Ferritin  . Vitamin B12  . VITAMIN D 25 Hydroxy (Vit-D Deficiency, Fractures)  . PTH, intact and calcium  . Vitamin B1  . Folate RBC  . Zinc  . Copper, serum  . Lipid panel    [place]    Order Specific Question:   Has the patient fasted?    Answer:   No  . HIV antibody  . TSH        Meds ordered this encounter  Medications  . citalopram (CELEXA) 20 MG tablet    Sig: Take 1 tablet (20 mg total) by mouth daily.    Dispense:  90 tablet    Refill:  3   Return precautions advised.   StGarret ReddishMD   .shLind Covert

## 2016-03-14 ENCOUNTER — Encounter: Payer: Self-pay | Admitting: Family Medicine

## 2016-03-14 LAB — CBC
HCT: 39.7 % (ref 36.0–46.0)
HEMOGLOBIN: 13.4 g/dL (ref 12.0–15.0)
MCHC: 33.7 g/dL (ref 30.0–36.0)
MCV: 86.7 fl (ref 78.0–100.0)
PLATELETS: 371 10*3/uL (ref 150.0–400.0)
RBC: 4.58 Mil/uL (ref 3.87–5.11)
RDW: 13.6 % (ref 11.5–15.5)
WBC: 7.9 10*3/uL (ref 4.0–10.5)

## 2016-03-14 LAB — COMPREHENSIVE METABOLIC PANEL
ALBUMIN: 4 g/dL (ref 3.5–5.2)
ALT: 16 U/L (ref 0–35)
AST: 17 U/L (ref 0–37)
Alkaline Phosphatase: 81 U/L (ref 39–117)
BILIRUBIN TOTAL: 0.5 mg/dL (ref 0.2–1.2)
BUN: 11 mg/dL (ref 6–23)
CALCIUM: 9.2 mg/dL (ref 8.4–10.5)
CHLORIDE: 107 meq/L (ref 96–112)
CO2: 28 mEq/L (ref 19–32)
CREATININE: 0.72 mg/dL (ref 0.40–1.20)
GFR: 93.97 mL/min (ref >60.00)
Glucose, Bld: 86 mg/dL (ref 70–99)
Potassium: 5.2 mEq/L — ABNORMAL HIGH (ref 3.5–5.1)
Sodium: 142 mEq/L (ref 135–145)
Total Protein: 6.6 g/dL (ref 6.0–8.3)

## 2016-03-14 LAB — LIPID PANEL
Cholesterol: 214 mg/dL — ABNORMAL HIGH (ref 0–200)
HDL: 56.7 mg/dL (ref >39.00)
LDL Cholesterol: 141 mg/dL — ABNORMAL HIGH (ref 0–99)
NONHDL: 157.61
TRIGLYCERIDES: 83 mg/dL (ref 0.0–149.0)
Total CHOL/HDL Ratio: 4
VLDL: 16.6 mg/dL (ref 0.0–40.0)

## 2016-03-14 LAB — IRON AND TIBC
%SAT: 18 % (ref 11–50)
IRON: 74 ug/dL (ref 40–190)
TIBC: 405 ug/dL (ref 250–450)
UIBC: 331 ug/dL (ref 125–400)

## 2016-03-14 LAB — FERRITIN: Ferritin: 18.2 ng/mL (ref 10.0–291.0)

## 2016-03-14 LAB — TSH: TSH: 5.34 u[IU]/mL — AB (ref 0.35–4.50)

## 2016-03-14 LAB — VITAMIN B12: Vitamin B-12: >1500 pg/mL — ABNORMAL HIGH (ref 211–911)

## 2016-03-14 LAB — HIV ANTIBODY (ROUTINE TESTING W REFLEX): HIV 1&2 Ab, 4th Generation: NONREACTIVE

## 2016-03-14 LAB — VITAMIN D 25 HYDROXY (VIT D DEFICIENCY, FRACTURES): VITD: 33.2 ng/mL (ref 30.00–100.00)

## 2016-03-15 ENCOUNTER — Other Ambulatory Visit: Payer: Self-pay

## 2016-03-15 DIAGNOSIS — E039 Hypothyroidism, unspecified: Secondary | ICD-10-CM

## 2016-03-15 LAB — PTH, INTACT AND CALCIUM
CALCIUM: 8.9 mg/dL (ref 8.6–10.2)
PTH: 71 pg/mL — AB (ref 14–64)

## 2016-03-15 LAB — FOLATE RBC: RBC FOLATE: 756 ng/mL (ref >280)

## 2016-03-16 LAB — ZINC: ZINC: 99 ug/dL (ref 60–130)

## 2016-03-16 LAB — COPPER, SERUM: Copper: 114 ug/dL (ref 70–175)

## 2016-03-20 LAB — VITAMIN B1: Vitamin B1 (Thiamine): 37 nmol/L — ABNORMAL HIGH (ref 8–30)

## 2016-03-25 DIAGNOSIS — Z3042 Encounter for surveillance of injectable contraceptive: Secondary | ICD-10-CM | POA: Diagnosis not present

## 2016-03-25 DIAGNOSIS — Z1231 Encounter for screening mammogram for malignant neoplasm of breast: Secondary | ICD-10-CM | POA: Diagnosis not present

## 2016-04-06 ENCOUNTER — Encounter: Payer: Self-pay | Admitting: Family Medicine

## 2016-04-14 ENCOUNTER — Other Ambulatory Visit: Payer: Self-pay | Admitting: Family Medicine

## 2016-04-15 ENCOUNTER — Encounter: Payer: Self-pay | Admitting: Family Medicine

## 2016-04-22 ENCOUNTER — Other Ambulatory Visit: Payer: Self-pay | Admitting: Family Medicine

## 2016-04-22 ENCOUNTER — Other Ambulatory Visit (INDEPENDENT_AMBULATORY_CARE_PROVIDER_SITE_OTHER): Payer: BLUE CROSS/BLUE SHIELD

## 2016-04-22 DIAGNOSIS — E039 Hypothyroidism, unspecified: Secondary | ICD-10-CM | POA: Diagnosis not present

## 2016-04-22 LAB — TSH: TSH: 11.64 u[IU]/mL — AB (ref 0.35–4.50)

## 2016-04-22 MED ORDER — LEVOTHYROXINE SODIUM 112 MCG PO TABS: 224.0000 ug | ORAL_TABLET | Freq: Every day | ORAL | 5 refills | Status: DC

## 2016-04-30 IMAGING — CT CT KNEE*R* W/O CM
1 series · 12 of 14 positions shown, 15 images · non-contrast
Comparison: None.

CLINICAL DATA: Right tibial plateau fracture with pain and
swelling.

EXAM:
CT OF THE RIGHT KNEE WITHOUT CONTRAST
TECHNIQUE: Multidetector CT imaging of the RIGHT knee was performed according
to the standard protocol. Multiplanar CT image reconstructions were
also generated.

[Series 4: lower ext 1.5 st · axial · 0.40mm/px · z∈[+468,+630]mm · 12 of 128 slices shown, 15 images]
[im 10/128  soft-tissue]
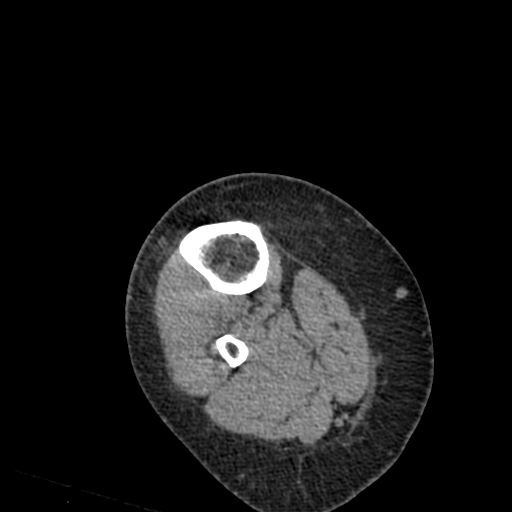
[im 10/128  bone]
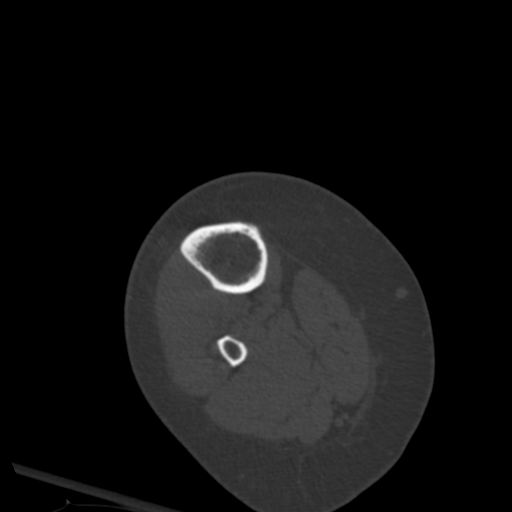
[im 20/128  bone]
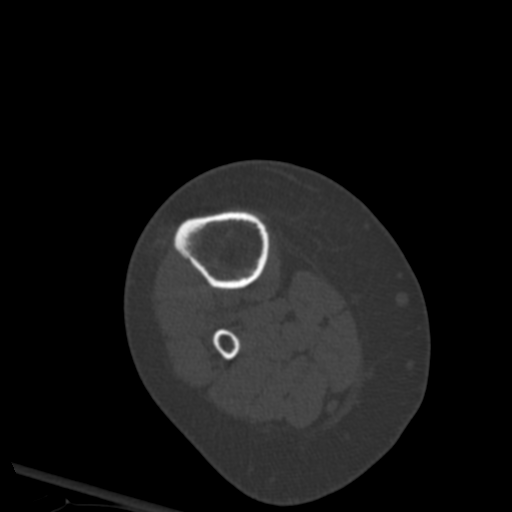
[im 30/128  bone]
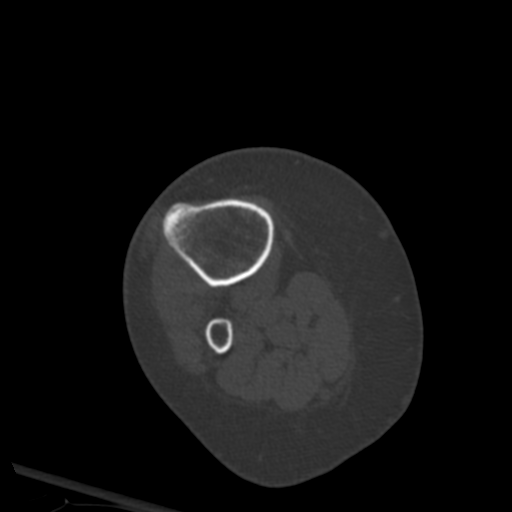
[im 40/128  bone]
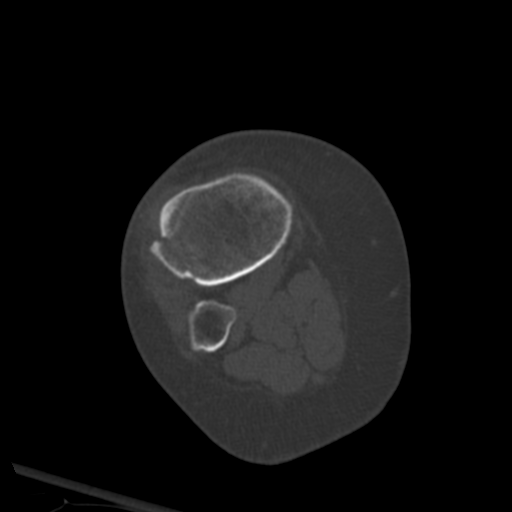
[im 49/128  soft-tissue]
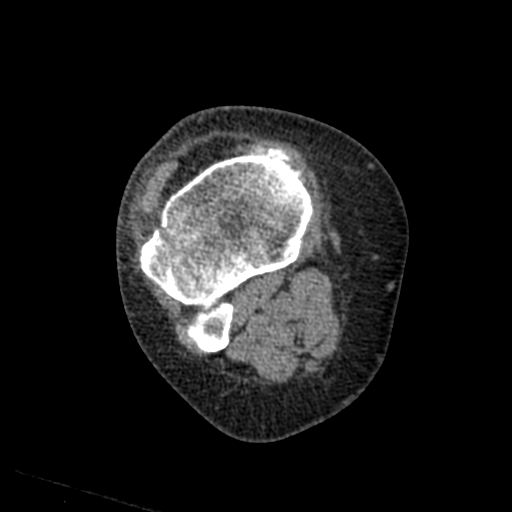
[im 49/128  bone]
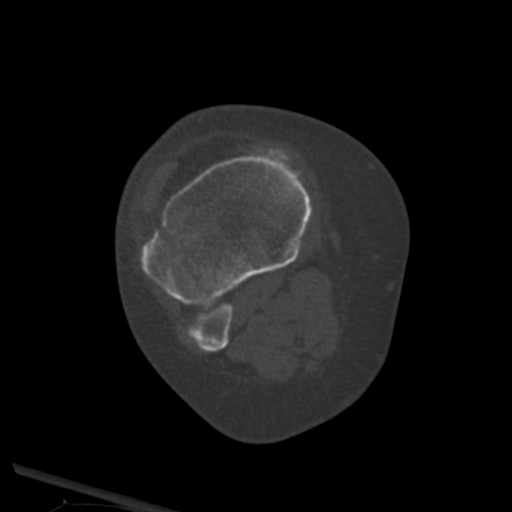
[im 59/128  bone]
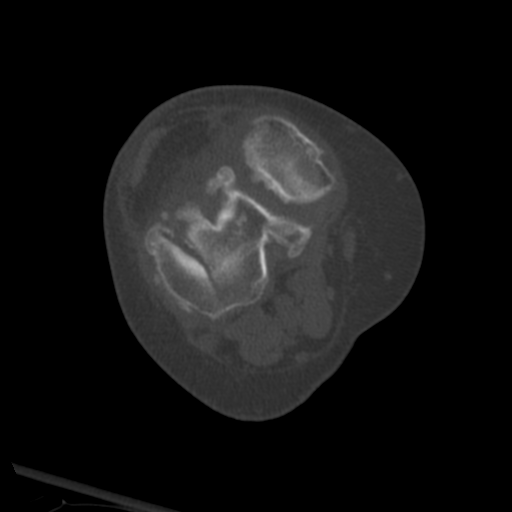
[im 69/128  bone]
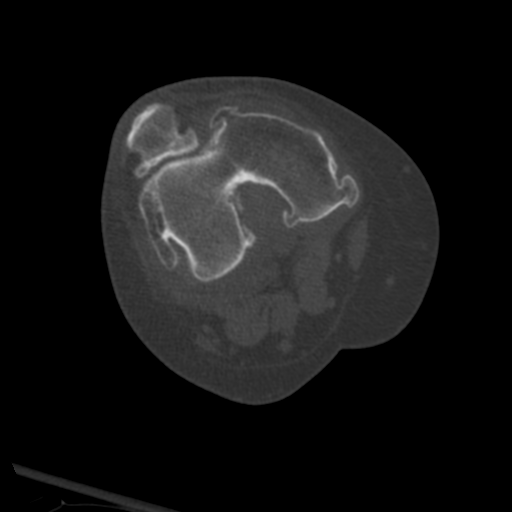
[im 79/128  bone]
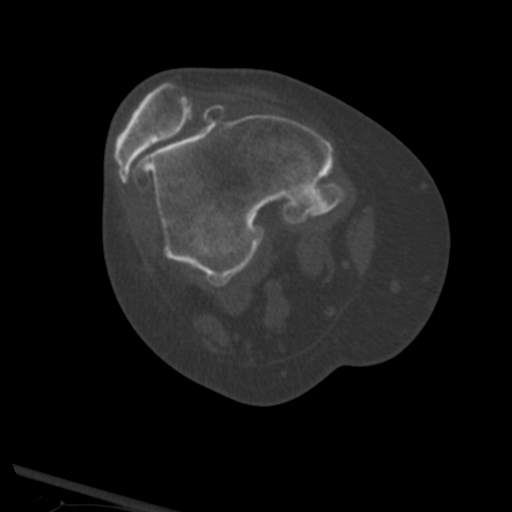
[im 88/128  soft-tissue]
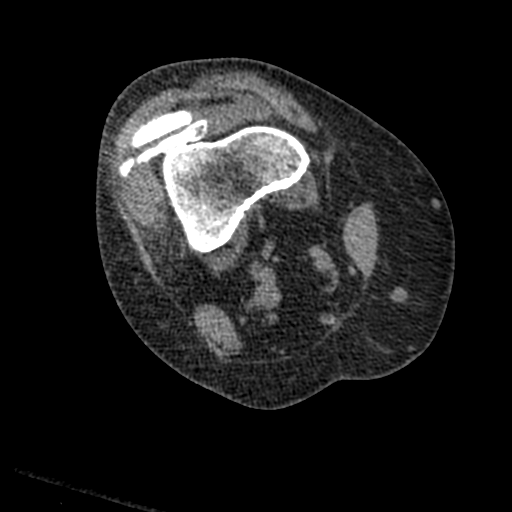
[im 88/128  bone]
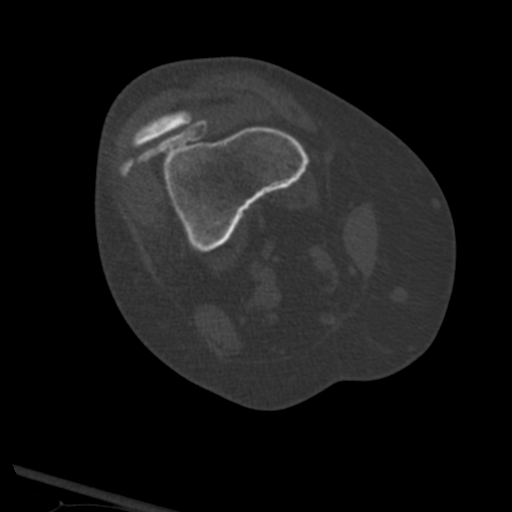
[im 98/128  bone]
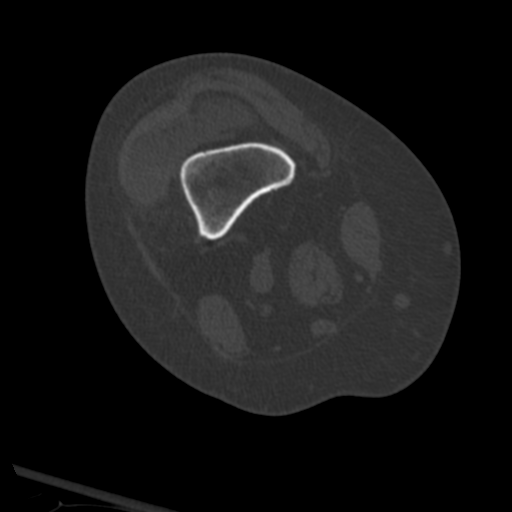
[im 108/128  bone]
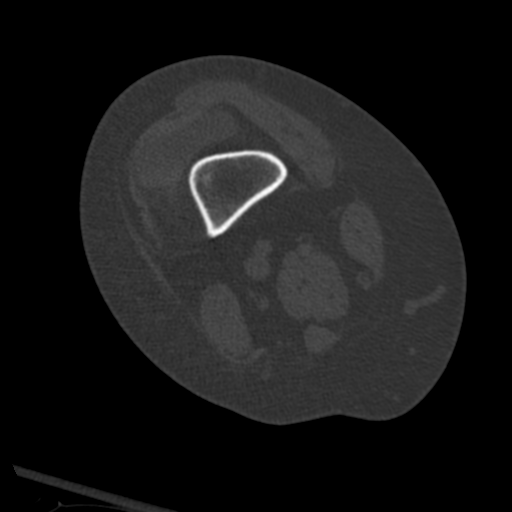
[im 118/128  bone]
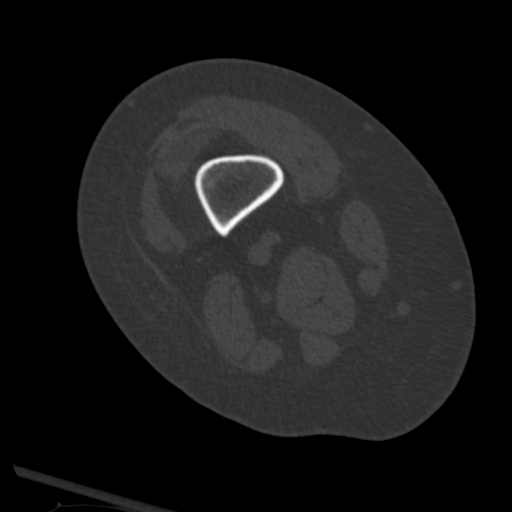

[12 of 14 positions shown; findings below may reference images not displayed]

FINDINGS: There is a mildly comminuted fracture of the lateral tibial plateau
with the a vertical primary fracture cleft involving the articular
surface with 3 mm of depression. There is a large lipohemarthrosis.

There is no other fracture or dislocation. There is severe
tricompartmental osteoarthritis. There is no aggressive lytic or
sclerotic osseous lesion.

There is no focal fluid collection or hematoma. The muscles are
normal.
IMPRESSION: 1. Mildly comminuted fracture of the lateral tibial plateau with 3
mm of depression at the articular surface consistent with Schatzker
II classification.
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
## 2016-05-28 ENCOUNTER — Encounter: Payer: Self-pay | Admitting: Family Medicine

## 2016-05-28 MED ORDER — GABAPENTIN 100 MG PO CAPS: 100.0000 mg | ORAL_CAPSULE | Freq: Every day | ORAL | 3 refills | Status: DC

## 2016-05-28 NOTE — Telephone Encounter (Signed)
Dr. Yong Channel - Please advise. Thanks!

## 2016-06-14 ENCOUNTER — Other Ambulatory Visit (INDEPENDENT_AMBULATORY_CARE_PROVIDER_SITE_OTHER): Payer: BLUE CROSS/BLUE SHIELD

## 2016-06-14 ENCOUNTER — Other Ambulatory Visit: Payer: Self-pay | Admitting: Family Medicine

## 2016-06-14 DIAGNOSIS — E039 Hypothyroidism, unspecified: Secondary | ICD-10-CM | POA: Diagnosis not present

## 2016-06-14 LAB — TSH: TSH: 5.66 u[IU]/mL — AB (ref 0.35–4.50)

## 2016-06-14 MED ORDER — LEVOTHYROXINE SODIUM 125 MCG PO TABS: 250.0000 ug | ORAL_TABLET | Freq: Every day | ORAL | 5 refills | Status: DC

## 2016-06-17 ENCOUNTER — Encounter: Payer: Self-pay | Admitting: Family Medicine

## 2016-06-17 ENCOUNTER — Telehealth: Payer: Self-pay | Admitting: Family Medicine

## 2016-06-17 NOTE — Telephone Encounter (Signed)
Kara [name] pt returned your call.

## 2016-06-18 ENCOUNTER — Other Ambulatory Visit: Payer: Self-pay

## 2016-06-18 DIAGNOSIS — E039 Hypothyroidism, unspecified: Secondary | ICD-10-CM

## 2016-06-18 NOTE — Telephone Encounter (Signed)
Pt sent My Chart message to let me know she had seen lab results and got my voicemail messages

## 2016-07-10 ENCOUNTER — Encounter: Payer: Self-pay | Admitting: Family Medicine

## 2016-07-10 ENCOUNTER — Other Ambulatory Visit: Payer: Self-pay

## 2016-07-10 MED ORDER — TRAMADOL HCL 50 MG PO TABS: 50.0000 mg | ORAL_TABLET | Freq: Three times a day (TID) | ORAL | 1 refills | Status: DC | PRN

## 2016-07-29 ENCOUNTER — Encounter (HOSPITAL_COMMUNITY): Payer: Self-pay

## 2016-08-07 ENCOUNTER — Encounter: Payer: Self-pay | Admitting: Family Medicine

## 2016-08-07 ENCOUNTER — Telehealth: Payer: Self-pay | Admitting: Family Medicine

## 2016-08-07 ENCOUNTER — Other Ambulatory Visit (INDEPENDENT_AMBULATORY_CARE_PROVIDER_SITE_OTHER): Payer: BLUE CROSS/BLUE SHIELD

## 2016-08-07 DIAGNOSIS — E039 Hypothyroidism, unspecified: Secondary | ICD-10-CM

## 2016-08-07 LAB — TSH: TSH: 0.46 u[IU]/mL (ref 0.35–4.50)

## 2016-08-07 NOTE — Telephone Encounter (Signed)
Updated in the chart and I notified the patient via My Chart

## 2016-08-07 NOTE — Telephone Encounter (Signed)
Patient wants to update pharmacy for ALL medications to [facility] on [address].

## 2016-10-09 ENCOUNTER — Encounter: Payer: Self-pay | Admitting: Family Medicine

## 2016-10-09 ENCOUNTER — Other Ambulatory Visit: Payer: Self-pay | Admitting: Family Medicine

## 2016-10-10 ENCOUNTER — Other Ambulatory Visit: Payer: Self-pay

## 2016-10-10 MED ORDER — TRAMADOL HCL 50 MG PO TABS: 50.0000 mg | ORAL_TABLET | Freq: Three times a day (TID) | ORAL | 1 refills | Status: DC | PRN

## 2016-11-11 DIAGNOSIS — Z3042 Encounter for surveillance of injectable contraceptive: Secondary | ICD-10-CM | POA: Diagnosis not present

## 2016-12-08 ENCOUNTER — Encounter: Payer: Self-pay | Admitting: Family Medicine

## 2016-12-08 ENCOUNTER — Other Ambulatory Visit: Payer: Self-pay | Admitting: Family Medicine

## 2016-12-09 ENCOUNTER — Other Ambulatory Visit: Payer: Self-pay

## 2016-12-09 MED ORDER — CITALOPRAM HYDROBROMIDE 20 MG PO TABS: 20.0000 mg | ORAL_TABLET | Freq: Every day | ORAL | 1 refills | Status: DC

## 2016-12-09 MED ORDER — LEVOTHYROXINE SODIUM 125 MCG PO TABS: 250.0000 ug | ORAL_TABLET | Freq: Every day | ORAL | 3 refills | Status: DC

## 2016-12-09 MED ORDER — CITALOPRAM HYDROBROMIDE 20 MG PO TABS: 20.0000 mg | ORAL_TABLET | Freq: Every day | ORAL | 0 refills | Status: DC

## 2016-12-09 MED ORDER — LEVOTHYROXINE SODIUM 125 MCG PO TABS: 250.0000 ug | ORAL_TABLET | Freq: Every day | ORAL | 0 refills | Status: DC

## 2017-01-03 ENCOUNTER — Other Ambulatory Visit: Payer: Self-pay

## 2017-01-03 ENCOUNTER — Encounter: Payer: Self-pay | Admitting: Family Medicine

## 2017-01-03 MED ORDER — TRAMADOL HCL 50 MG PO TABS: 50.0000 mg | ORAL_TABLET | Freq: Three times a day (TID) | ORAL | 1 refills | Status: DC | PRN

## 2017-01-27 DIAGNOSIS — Z3042 Encounter for surveillance of injectable contraceptive: Secondary | ICD-10-CM | POA: Diagnosis not present

## 2017-01-31 ENCOUNTER — Ambulatory Visit (INDEPENDENT_AMBULATORY_CARE_PROVIDER_SITE_OTHER): Payer: Self-pay

## 2017-01-31 ENCOUNTER — Encounter (INDEPENDENT_AMBULATORY_CARE_PROVIDER_SITE_OTHER): Payer: Self-pay | Admitting: Orthopedic Surgery

## 2017-01-31 ENCOUNTER — Ambulatory Visit (INDEPENDENT_AMBULATORY_CARE_PROVIDER_SITE_OTHER): Payer: BLUE CROSS/BLUE SHIELD | Admitting: Orthopedic Surgery

## 2017-01-31 DIAGNOSIS — M79604 Pain in right leg: Secondary | ICD-10-CM | POA: Diagnosis not present

## 2017-01-31 DIAGNOSIS — M1712 Unilateral primary osteoarthritis, left knee: Secondary | ICD-10-CM

## 2017-01-31 MED ORDER — METHYLPREDNISOLONE ACETATE 40 MG/ML IJ SUSP
40.0000 mg | INTRAMUSCULAR | Status: AC | PRN
  Administered 2017-01-31: 40 mg via INTRA_ARTICULAR

## 2017-01-31 MED ORDER — LIDOCAINE HCL 1 % IJ SOLN
5.0000 mL | INTRAMUSCULAR | Status: AC | PRN
Start: 2017-01-31 — End: 2017-01-31
  Administered 2017-01-31: 5 mL

## 2017-01-31 NOTE — Progress Notes (Signed)
Office Visit Note   Patient: Kara [name]           Date of Birth: 01-Apr-1975           MRN: [id number] Visit Date: 01/31/2017              Requested by: Marin Olp, MD , [place] [id number] PCP: Marin Olp, MD  Chief Complaint  Patient presents with  . Right Knee - Pain      HPI: Patient is a 42 year old woman who is a year and a half status post right total knee arthroplasty status post removal of internal fixation.  Patient states she still has a contracture she is still going to therapy she walks on the tip of her toes on the right foot and she complains of increasing arthritis in her left knee with varus collapse.  Assessment & Plan: Visit Diagnoses:  1. Pain in right leg   2. Unilateral primary osteoarthritis, left knee     Plan: Again reinforced the importance of working with the jazz splint to work on extension continue with her strength training discussed that we could proceed with manipulation under anesthesia if she would like to she is not interested at this time.  Patient's left knee was injected she states she does not want to consider surgery on the left knee we will follow-up as needed.  Follow-Up Instructions: Return if symptoms worsen or fail to improve.   Ortho Exam  Patient is alert, oriented, no adenopathy, well-dressed, normal affect, normal respiratory effort. Examination patient has an antalgic gait varus alignment to the left knee flexed knee contracture on the right lacks about 10 degrees to full extension.  Patient has no pain with range of motion of the knee there is no redness no cellulitis no effusion.  She has a patella that tracks midline no subluxation of the patella no tenderness to palpation.  Examination the left knee she has tenderness to palpation of the medial joint line collaterals and cruciates are stable.  Imaging: Xr Knee 1-2 Views Right  Result Date: 01/31/2017 2 view radiographs of the right knee  shows a congruent joint space no failure of the implant no subsidence.  Radiographs do show varus collapse of the left knee with bone-on-bone contact medial joint line with periarticular bony spurs.  No images are attached to the encounter.  Labs: Lab Results  Component Value Date   HGBA1C 5.4 05/18/2013   HGBA1C 5.6 06/13/2011   REPTSTATUS 05/03/2012 FINAL 04/30/2012   CULT  04/30/2012    NO STAPHYLOCOCCUS AUREUS ISOLATED Note: No MRSA Isolated    @LABSALLVALUES (HGBA1)@  There is no height or weight on file to calculate BMI.  Orders:  Orders Placed This Encounter  Procedures  . XR Knee 1-2 Views Right   No orders of the defined types were placed in this encounter.    Procedures: Large Joint Inj: L knee on 01/31/2017 8:53 AM Indications: pain and diagnostic evaluation Details: 22 G 1.5 in needle, anteromedial approach  Arthrogram: No  Medications: 5 mL lidocaine 1 %; 40 mg methylPREDNISolone acetate 40 MG/ML Outcome: tolerated well, no immediate complications Procedure, treatment alternatives, risks and benefits explained, specific risks discussed. Consent was given by the patient. Immediately prior to procedure a time out was called to verify the correct patient, procedure, equipment, support staff and site/side marked as required. Patient was prepped and draped in the usual sterile fashion.      Clinical Data: No additional  findings.  ROS:  All other systems negative, except as noted in the HPI. Review of Systems  Objective: Vital Signs: There were no vitals taken for this visit.  Specialty Comments:  No specialty comments available.  PMFS History: Patient Active Problem List   Diagnosis Date Noted  . 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 Allergic rhinitis 02/03/2014  . Vitamin D deficiency 02/03/2014  . BPPV (benign paroxysmal positional vertigo) 04/19/2013  . Osteoarthritis of both knees 04/19/2013  . 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  Depression 02/22/2010  . DJD (degenerative joint disease) 10/26/2009  . OBESITY 06/16/2008  . Hyperlipidemia 06/11/2007  . Hypothyroidism 08/18/2006   Past Medical History:  Diagnosis Date  . ANXIETY 08/18/2006  . 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 DEPRESSION 02/22/2010  . GERD (gastroesophageal reflux disease)    history of  . HYPERLIPIDEMIA 06/11/2007  . HYPOTHYROIDISM 08/18/2006  . OBESITY 06/16/2008  . Pneumonia    hx of   . PONV (postoperative nausea and vomiting)    used Scopolamine patch    Family History  Problem Relation Age of Onset  . Schizophrenia Mother   . Cancer Mother        lung, smoker  . Breast cancer Mother 58  . Uterine cancer Mother   . Heart disease Father 53       smoker at time    Past Surgical History:  Procedure Laterality Date  . CHOLECYSTECTOMY N/A 05/08/2012   Procedure: LAPAROSCOPIC CHOLECYSTECTOMY WITH INTRAOPERATIVE CHOLANGIOGRAM;  Surgeon: Leighton Ruff, MD;  Location: [facility];  Service: General;  Laterality: N/A;  . FRACTURE SURGERY    . GASTRIC ROUX-EN-Y  12/29/2012   Procedure: LAPAROSCOPIC ROUX-EN-Y GASTRIC BYPASS WITH UPPER ENDOSCOPY;  Surgeon: Madilyn Hook, DO;  Location: [facility];  Service: General;;  . HARDWARE REMOVAL Right 06/14/2015   Procedure: Removal Internal Fixation Right Lateral Tibial Plateau;  Surgeon: [name], MD;  Location: [place];  Service: Orthopedics;  Laterality: Right;  . HERNIA REPAIR    . KNEE ARTHROSCOPY Right 07/28/12   dr. Sharol Given  . ORIF TIBIA PLATEAU Right 03/10/2015   Procedure: OPEN REDUCTION INTERNAL FIXATION (ORIF) LATERAL TIBIAL PLATEAU;  Surgeon: [name], MD;  Location: [place];  Service: Orthopedics;  Laterality: Right;  . ORIF ULNAR FRACTURE Right 1995   plates and pins  . TOTAL KNEE ARTHROPLASTY Right 06/14/2015  . TOTAL KNEE ARTHROPLASTY Right 06/14/2015   Procedure: RIGHT TOTAL KNEE ARTHROPLASTY;  Surgeon: [name], MD;  Location: [place];  Service: Orthopedics;  Laterality:  Right;  . UPPER GI ENDOSCOPY  12/29/2012   Procedure: UPPER GI ENDOSCOPY;  Surgeon: Madilyn Hook, DO;  Location[facility];  Service: General;;   Social History   Occupational History  . Not on file  Tobacco Use  . Smoking status: Former Smoker    Types: Cigarettes    Last attempt to quit: 01/28/2005    Years since quitting: 12.0  . Smokeless tobacco: Never Used  Substance and Sexual Activity  . Alcohol use: No    Comment: not since gastric bypass  . Drug use: No  . Sexual activity: Not on file

## 2017-03-04 DIAGNOSIS — R509 Fever, unspecified: Secondary | ICD-10-CM | POA: Diagnosis not present

## 2017-03-04 DIAGNOSIS — J019 Acute sinusitis, unspecified: Secondary | ICD-10-CM | POA: Diagnosis not present

## 2017-03-10 ENCOUNTER — Other Ambulatory Visit: Payer: Self-pay | Admitting: Family Medicine

## 2017-03-18 ENCOUNTER — Encounter: Payer: Self-pay | Admitting: Family Medicine

## 2017-03-19 ENCOUNTER — Other Ambulatory Visit: Payer: Self-pay

## 2017-03-19 MED ORDER — TRAMADOL HCL 50 MG PO TABS: 50.0000 mg | ORAL_TABLET | Freq: Three times a day (TID) | ORAL | 1 refills | Status: DC | PRN

## 2017-03-26 ENCOUNTER — Ambulatory Visit (INDEPENDENT_AMBULATORY_CARE_PROVIDER_SITE_OTHER): Payer: BLUE CROSS/BLUE SHIELD | Admitting: Physician Assistant

## 2017-03-26 ENCOUNTER — Encounter: Payer: Self-pay | Admitting: Physician Assistant

## 2017-03-26 ENCOUNTER — Encounter: Payer: BLUE CROSS/BLUE SHIELD | Admitting: Family Medicine

## 2017-03-26 VITALS — BP 118/80 | HR 75 | Temp 98.0°F | Ht 66.0 in | Wt 289.0 lb

## 2017-03-26 DIAGNOSIS — Z96651 Presence of right artificial knee joint: Secondary | ICD-10-CM

## 2017-03-26 DIAGNOSIS — E034 Atrophy of thyroid (acquired): Secondary | ICD-10-CM | POA: Diagnosis not present

## 2017-03-26 DIAGNOSIS — E785 Hyperlipidemia, unspecified: Secondary | ICD-10-CM | POA: Diagnosis not present

## 2017-03-26 DIAGNOSIS — Z9884 Bariatric surgery status: Secondary | ICD-10-CM

## 2017-03-26 DIAGNOSIS — Z0000 Encounter for general adult medical examination without abnormal findings: Secondary | ICD-10-CM | POA: Diagnosis not present

## 2017-03-26 DIAGNOSIS — F320 Major depressive disorder, single episode, mild: Secondary | ICD-10-CM

## 2017-03-26 DIAGNOSIS — M170 Bilateral primary osteoarthritis of knee: Secondary | ICD-10-CM | POA: Diagnosis not present

## 2017-03-26 DIAGNOSIS — Z23 Encounter for immunization: Secondary | ICD-10-CM | POA: Diagnosis not present

## 2017-03-26 MED ORDER — VENLAFAXINE HCL 25 MG PO TABS: 25.0000 mg | ORAL_TABLET | Freq: Two times a day (BID) | ORAL | 1 refills | Status: DC

## 2017-03-26 NOTE — Assessment & Plan Note (Signed)
Continues to take prn Tramadol for this. Overall well controlled per her report.
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
## 2017-03-26 NOTE — Assessment & Plan Note (Signed)
Check labs per orders. Currently on an upward trend with her weight. Encouraged continue HOPE program through [place]. Offered nutrition visit with myself if she would like. Provided emotional support.

## 2017-03-26 NOTE — Assessment & Plan Note (Signed)
Weight is starting to increase. We discussed nutrition visit with me. She is very adamant about cleaning up her diet to help support her partner's healthier lifestyle. Encouraged continued exercise. We are also switching to Effexor -- she is helping this will be more weight neutral for her than the celexa.

## 2017-03-26 NOTE — Assessment & Plan Note (Signed)
Sees Dr. Sharol Given regularly.

## 2017-03-26 NOTE — Assessment & Plan Note (Signed)
Worsening. No SI/HI. Wean off Celexa and start Effexor. We did review risk of serotonin syndrome with Tramadol and these medications, patient verbalized understanding and red flags to look for. Weaning instructions for Celexa given in AVS. Follow-up in 4 weeks, sooner if needed. PHQ-9 is 19 today. I discussed with patient that if they develop any SI, to tell someone immediately and seek medical attention.

## 2017-03-26 NOTE — Patient Instructions (Addendum)
It was great to meet you!  Please make an appointment with the lab on your way out. I would like for you to return for lab work within 1-2 weeks. After midnight on the day of the lab draw, please do not eat anything. You may have water, black coffee, unsweetened tea.  Cut your celexa tablets in half and take 10 mg daily x 1 week. During that same week, start Effexor 25 mg twice day.  The next week, take 10 mg celexa every other day, continue Effexor 25 mg twice a day. The final week, stop celexa and continue Effexor 25 mg twice a day. Follow-up with [name] in 4 weeks so we can see how you are doing with this. Please see [name] sooner if you need.   Health Maintenance, Female Adopting a healthy lifestyle and getting preventive care can go a long way to promote health and wellness. Talk with your health care provider about what schedule of regular examinations is right for you. This is a good chance for you to check in with your provider about disease prevention and staying healthy. In between checkups, there are plenty of things you can do on your own. Experts have done a lot of research about which lifestyle changes and preventive measures are most likely to keep you healthy. Ask your health care provider for more information. Weight and diet Eat a healthy diet  Be sure to include plenty of vegetables, fruits, low-fat dairy products, and lean protein.  Do not eat a lot of foods high in solid fats, added sugars, or salt.  Get regular exercise. This is one of the most important things you can do for your health. ? Most adults should exercise for at least 150 minutes each week. The exercise should increase your heart rate and make you sweat (moderate-intensity exercise). ? Most adults should also do strengthening exercises at least twice a week. This is in addition to the moderate-intensity exercise.  Maintain a healthy weight  Body mass index (BMI) is a measurement that can be used to identify  possible weight problems. It estimates body fat based on height and weight. Your health care provider can help determine your BMI and help you achieve or maintain a healthy weight.  For females 21 years of age and older: ? A BMI below 18.5 is considered underweight. ? A BMI of 18.5 to 24.9 is normal. ? A BMI of 25 to 29.9 is considered overweight. ? A BMI of 30 and above is considered obese.  Watch levels of cholesterol and blood lipids  You should start having your blood tested for lipids and cholesterol at 44 years of age, then have this test every 5 years.  You may need to have your cholesterol levels checked more often if: ? Your lipid or cholesterol levels are high. ? You are older than 44 years of age. ? You are at high risk for heart disease.  Cancer screening Lung Cancer  Lung cancer screening is recommended for adults 34-59 years old who are at high risk for lung cancer because of a history of smoking.  A yearly low-dose CT scan of the lungs is recommended for people who: ? Currently smoke. ? Have quit within the past 15 years. ? Have at least a 30-pack-year history of smoking. A pack year is smoking an average of one pack of cigarettes a day for 1 year.  Yearly screening should continue until it has been 15 years since you quit.  Yearly screening  should stop if you develop a health problem that would prevent you from having lung cancer treatment.  Breast Cancer  Practice breast self-awareness. This means understanding how your breasts normally appear and feel.  It also means doing regular breast self-exams. Let your health care provider know about any changes, no matter how small.  If you are in your 20s or 30s, you should have a clinical breast exam (CBE) by a health care provider every 1-3 years as part of a regular health exam.  If you are 77 or older, have a CBE every year. Also consider having a breast X-ray (mammogram) every year.  If you have a family history  of breast cancer, talk to your health care provider about genetic screening.  If you are at high risk for breast cancer, talk to your health care provider about having an MRI and a mammogram every year.  Breast cancer gene (BRCA) assessment is recommended for women who have family members with BRCA-related cancers. BRCA-related cancers include: ? Breast. ? Ovarian. ? Tubal. ? Peritoneal cancers.  Results of the assessment will determine the need for genetic counseling and BRCA1 and BRCA2 testing.  Cervical Cancer Your health care provider may recommend that you be screened regularly for cancer of the pelvic organs (ovaries, uterus, and vagina). This screening involves a pelvic examination, including checking for microscopic changes to the surface of your cervix (Pap test). You may be encouraged to have this screening done every 3 years, beginning at age 29.  For women ages 76-65, health care providers may recommend pelvic exams and Pap testing every 3 years, or they may recommend the Pap and pelvic exam, combined with testing for human papilloma virus (HPV), every 5 years. Some types of HPV increase your risk of cervical cancer. Testing for HPV may also be done on women of any age with unclear Pap test results.  Other health care providers may not recommend any screening for nonpregnant women who are considered low risk for pelvic cancer and who do not have symptoms. Ask your health care provider if a screening pelvic exam is right for you.  If you have had past treatment for cervical cancer or a condition that could lead to cancer, you need Pap tests and screening for cancer for at least 20 years after your treatment. If Pap tests have been discontinued, your risk factors (such as having a new sexual partner) need to be reassessed to determine if screening should resume. Some women have medical problems that increase the chance of getting cervical cancer. In these cases, your health care provider  may recommend more frequent screening and Pap tests.  Colorectal Cancer  This type of cancer can be detected and often prevented.  Routine colorectal cancer screening usually begins at 44 years of age and continues through 44 years of age.  Your health care provider may recommend screening at an earlier age if you have risk factors for colon cancer.  Your health care provider may also recommend using home test kits to check for hidden blood in the stool.  A small camera at the end of a tube can be used to examine your colon directly (sigmoidoscopy or colonoscopy). This is done to check for the earliest forms of colorectal cancer.  Routine screening usually begins at age 22.  Direct examination of the colon should be repeated every 5-10 years through 44 years of age. However, you may need to be screened more often if early forms of precancerous polyps or  small growths are found.  Skin Cancer  Check your skin from head to toe regularly.  Tell your health care provider about any new moles or changes in moles, especially if there is a change in a mole's shape or color.  Also tell your health care provider if you have a mole that is larger than the size of a pencil eraser.  Always use sunscreen. Apply sunscreen liberally and repeatedly throughout the day.  Protect yourself by wearing long sleeves, pants, a wide-brimmed hat, and sunglasses whenever you are outside.  Heart disease, diabetes, and high blood pressure  High blood pressure causes heart disease and increases the risk of stroke. High blood pressure is more likely to develop in: ? People who have blood pressure in the high end of the normal range (130-139/85-89 mm Hg). ? People who are overweight or obese. ? People who are African American.  If you are 86-86 years of age, have your blood pressure checked every 3-5 years. If you are 84 years of age or older, have your blood pressure checked every year. You should have your  blood pressure measured twice-once when you are at a hospital or clinic, and once when you are not at a hospital or clinic. Record the average of the two measurements. To check your blood pressure when you are not at a hospital or clinic, you can use: ? An automated blood pressure machine at a pharmacy. ? A home blood pressure monitor.  If you are between 61 years and 77 years old, ask your health care provider if you should take aspirin to prevent strokes.  Have regular diabetes screenings. This involves taking a blood sample to check your fasting blood sugar level. ? If you are at a normal weight and have a low risk for diabetes, have this test once every three years after 44 years of age. ? If you are overweight and have a high risk for diabetes, consider being tested at a younger age or more often. Preventing infection Hepatitis B  If you have a higher risk for hepatitis B, you should be screened for this virus. You are considered at high risk for hepatitis B if: ? You were born in a country where hepatitis B is common. Ask your health care provider which countries are considered high risk. ? Your parents were born in a high-risk country, and you have not been immunized against hepatitis B (hepatitis B vaccine). ? You have HIV or AIDS. ? You use needles to inject street drugs. ? You live with someone who has hepatitis B. ? You have had sex with someone who has hepatitis B. ? You get hemodialysis treatment. ? You take certain medicines for conditions, including cancer, organ transplantation, and autoimmune conditions.  Hepatitis C  Blood testing is recommended for: ? Everyone born from [birth year] through 1967. ? Anyone with known risk factors for hepatitis C.  Sexually transmitted infections (STIs)  You should be screened for sexually transmitted infections (STIs) including gonorrhea and chlamydia if: ? You are sexually active and are younger than 44 years of age. ? You are older than 44  years of age and your health care provider tells you that you are at risk for this type of infection. ? Your sexual activity has changed since you were last screened and you are at an increased risk for chlamydia or gonorrhea. Ask your health care provider if you are at risk.  If you do not have HIV, but are at risk, it  may be recommended that you take a prescription medicine daily to prevent HIV infection. This is called pre-exposure prophylaxis (PrEP). You are considered at risk if: ? You are sexually active and do not regularly use condoms or know the HIV status of your partner(s). ? You take drugs by injection. ? You are sexually active with a partner who has HIV.  Talk with your health care provider about whether you are at high risk of being infected with HIV. If you choose to begin PrEP, you should first be tested for HIV. You should then be tested every 3 months for as long as you are taking PrEP. Pregnancy  If you are premenopausal and you may become pregnant, ask your health care provider about preconception counseling.  If you may become pregnant, take 400 to 800 micrograms (mcg) of folic acid every day.  If you want to prevent pregnancy, talk to your health care provider about birth control (contraception). Osteoporosis and menopause  Osteoporosis is a disease in which the bones lose minerals and strength with aging. This can result in serious bone fractures. Your risk for osteoporosis can be identified using a bone density scan.  If you are 39 years of age or older, or if you are at risk for osteoporosis and fractures, ask your health care provider if you should be screened.  Ask your health care provider whether you should take a calcium or vitamin D supplement to lower your risk for osteoporosis.  Menopause may have certain physical symptoms and risks.  Hormone replacement therapy may reduce some of these symptoms and risks. Talk to your health care provider about whether  hormone replacement therapy is right for you. Follow these instructions at home:  Schedule regular health, dental, and eye exams.  Stay current with your immunizations.  Do not use any tobacco products including cigarettes, chewing tobacco, or electronic cigarettes.  If you are pregnant, do not drink alcohol.  If you are breastfeeding, limit how much and how often you drink alcohol.  Limit alcohol intake to no more than 1 drink per day for nonpregnant women. One drink equals 12 ounces of beer, 5 ounces of wine, or 1 ounces of hard liquor.  Do not use street drugs.  Do not share needles.  Ask your health care provider for help if you need support or information about quitting drugs.  Tell your health care provider if you often feel depressed.  Tell your health care provider if you have ever been abused or do not feel safe at home. This information is not intended to replace advice given to you by your health care provider. Make sure you discuss any questions you have with your health care provider. Document Released: 07/30/2010 Document Revised: 06/22/2015 Document Reviewed: 10/18/2014 Elsevier Interactive Patient Education  2018 [employer].   Serotonin Syndrome Serotonin is a brain chemical that regulates the nervous system, which includes the brain, spinal cord, and nerves. Serotonin appears to play a role in all types of behavior, including appetite, emotions, movement, thinking, and response to stress. Excessively high levels of serotonin in the body can cause serotonin syndrome, which is a very dangerous condition. What are the causes? This condition can be caused by taking medicines or drugs that increase the level of serotonin in your body. These include:  Antidepressant medicines.  Migraine medicines.  Certain pain medicines.  Certain recreational drugs, including ecstasy, LSD, cocaine, and amphetamines.  Over-the-counter cough or cold medicines that contain  dextromethorphan.  Certain herbal supplements,  including St. John's wort, ginseng, and nutmeg.  This condition usually occurs when you take these medicines or drugs in combination, but it can also happen with a high dose of a single medicine or drug. What increases the risk? This condition is more likely to develop in:  People who have recently increased the dosage of medicine that increases the serotonin level.  People who just started taking medicine that increases the serotonin level.  What are the signs or symptoms? Symptoms of this condition usually happens within several hours of a medicine change. Symptoms include:  Headache.  Muscle twitching or stiffness.  Diarrhea.  Confusion.  Restlessness or agitation.  Shivering or goose bumps.  Loss of muscle coordination.  Rapid heart rate.  Sweating.  Severe cases of serotonin syndromecan cause:  Irregular heartbeat.  Seizures.  Loss of consciousness.  High fever.  How is this diagnosed? This condition is diagnosed with a medical history and physical exam. You will be asked aboutyour symptoms and your use of medicines and recreational drugs. Your health care provider may also order lab work or additional tests to rule out other causes of your symptoms. How is this treated? The treatment for this condition depends on the severity of your symptoms. For mild cases, stopping the medicine that caused your condition is usually all that is needed. For moderate to severe cases, hospitalization is required to monitor you and to prevent further muscle damage. Follow these instructions at home:  Take over-the-counter and prescription medicines only as told by your health care provider. This is important.  Check with your health care provider before you start taking any new prescriptions, over-the-counter medicines, herbs, or supplements.  Avoid combining any medicines that can cause this condition to occur.  Keep all  follow-up visits as told by your health care provider.This is important.  Maintain a healthy lifestyle. ? Eat healthy foods. ? Get plenty of sleep. ? Exercise regularly. ? Do not drink alcohol. ? Do not use recreational drugs. Contact a health care provider if:  Medicines do not seem to be helping.  Your symptoms do not improve or they get worse.  You have trouble taking care of yourself. Get help right away if:  You have worsening confusion, severe headache, chest pain, high fever, seizures, or loss of consciousness.  You have serious thoughts about hurting yourself or others.  You experience serious side effects of medicine, such as swelling of your face, lips, tongue, or throat. This information is not intended to replace advice given to you by your health care provider. Make sure you discuss any questions you have with your health care provider. Document Released: 02/22/2004 Document Revised: 09/09/2015 Document Reviewed: 01/27/2014 Elsevier Interactive Patient Education  [employer].

## 2017-03-26 NOTE — Assessment & Plan Note (Signed)
Re-check labs today. Currently on 250 mcg daily.

## 2017-03-26 NOTE — Assessment & Plan Note (Addendum)
She is now getting very serious about following heart healthy diet 2/2 her partner's new diet changes. Encouraged low sodium and low fat diet. Re-check labs.

## 2017-03-31 ENCOUNTER — Other Ambulatory Visit (INDEPENDENT_AMBULATORY_CARE_PROVIDER_SITE_OTHER): Payer: BLUE CROSS/BLUE SHIELD

## 2017-03-31 DIAGNOSIS — E785 Hyperlipidemia, unspecified: Secondary | ICD-10-CM

## 2017-03-31 DIAGNOSIS — E034 Atrophy of thyroid (acquired): Secondary | ICD-10-CM

## 2017-03-31 DIAGNOSIS — Z9884 Bariatric surgery status: Secondary | ICD-10-CM

## 2017-03-31 LAB — COMPREHENSIVE METABOLIC PANEL
ALT: 15 U/L (ref 0–35)
AST: 15 U/L (ref 0–37)
Albumin: 3.8 g/dL (ref 3.5–5.2)
Alkaline Phosphatase: 83 U/L (ref 39–117)
BUN: 12 mg/dL (ref 6–23)
CHLORIDE: 107 meq/L (ref 96–112)
CO2: 26 meq/L (ref 19–32)
Calcium: 9.3 mg/dL (ref 8.4–10.5)
Creatinine, Ser: 0.74 mg/dL (ref 0.40–1.20)
GFR: 90.6 mL/min (ref >60.00)
GLUCOSE: 75 mg/dL (ref 70–99)
POTASSIUM: 4.4 meq/L (ref 3.5–5.1)
SODIUM: 141 meq/L (ref 135–145)
Total Bilirubin: 0.5 mg/dL (ref 0.2–1.2)
Total Protein: 6.7 g/dL (ref 6.0–8.3)

## 2017-03-31 LAB — CBC WITH DIFFERENTIAL/PLATELET
BASOS PCT: 0.8 % (ref 0.0–3.0)
Basophils Absolute: 0.1 10*3/uL (ref 0.0–0.1)
EOS PCT: 5.9 % — AB (ref 0.0–5.0)
Eosinophils Absolute: 0.4 10*3/uL (ref 0.0–0.7)
HCT: 40.9 % (ref 36.0–46.0)
Hemoglobin: 13.6 g/dL (ref 12.0–15.0)
LYMPHS ABS: 2 10*3/uL (ref 0.7–4.0)
Lymphocytes Relative: 30.1 % (ref 12.0–46.0)
MCHC: 33.2 g/dL (ref 30.0–36.0)
MCV: 86.9 fl (ref 78.0–100.0)
MONOS PCT: 8.8 % (ref 3.0–12.0)
Monocytes Absolute: 0.6 10*3/uL (ref 0.1–1.0)
NEUTROS ABS: 3.6 10*3/uL (ref 1.4–7.7)
NEUTROS PCT: 54.4 % (ref 43.0–77.0)
Platelets: 342 10*3/uL (ref 150.0–400.0)
RBC: 4.7 Mil/uL (ref 3.87–5.11)
RDW: 13.9 % (ref 11.5–15.5)
WBC: 6.7 10*3/uL (ref 4.0–10.5)

## 2017-03-31 LAB — TSH: TSH: 2.2 u[IU]/mL (ref 0.35–4.50)

## 2017-03-31 LAB — VITAMIN B12: Vitamin B-12: 1066 pg/mL — ABNORMAL HIGH (ref 211–911)

## 2017-03-31 LAB — LIPID PANEL
CHOL/HDL RATIO: 4
Cholesterol: 169 mg/dL (ref 0–200)
HDL: 43.3 mg/dL (ref >39.00)
LDL CALC: 99 mg/dL (ref 0–99)
NONHDL: 125.74
Triglycerides: 135 mg/dL (ref 0.0–149.0)
VLDL: 27 mg/dL (ref 0.0–40.0)

## 2017-03-31 LAB — T4, FREE: Free T4: 1.16 ng/dL (ref 0.60–1.60)

## 2017-03-31 LAB — VITAMIN D 25 HYDROXY (VIT D DEFICIENCY, FRACTURES): VITD: 50.27 ng/mL (ref 30.00–100.00)

## 2017-04-04 LAB — VITAMIN B1: Vitamin B1 (Thiamine): 40 nmol/L — ABNORMAL HIGH (ref 8–30)

## 2017-04-04 LAB — IRON,TIBC AND FERRITIN PANEL
%SAT: 23 % (ref 11–50)
FERRITIN: 15 ng/mL (ref 10–232)
IRON: 91 ug/dL (ref 40–190)
TIBC: 392 mcg/dL (calc) (ref 250–450)

## 2017-04-04 LAB — ZINC: Zinc: 114 ug/dL (ref 60–130)

## 2017-04-04 LAB — FOLATE RBC: RBC FOLATE: 810 ng/mL (ref >280)

## 2017-04-04 LAB — COPPER, SERUM: Copper: 116 ug/dL (ref 70–175)

## 2017-04-13 ENCOUNTER — Other Ambulatory Visit: Payer: Self-pay | Admitting: Family Medicine

## 2017-04-14 ENCOUNTER — Other Ambulatory Visit: Payer: Self-pay

## 2017-04-14 DIAGNOSIS — Z3042 Encounter for surveillance of injectable contraceptive: Secondary | ICD-10-CM | POA: Diagnosis not present

## 2017-04-14 DIAGNOSIS — Z6841 Body Mass Index (BMI) 40.0 and over, adult: Secondary | ICD-10-CM | POA: Diagnosis not present

## 2017-04-14 DIAGNOSIS — Z1231 Encounter for screening mammogram for malignant neoplasm of breast: Secondary | ICD-10-CM | POA: Diagnosis not present

## 2017-04-14 DIAGNOSIS — Z01419 Encounter for gynecological examination (general) (routine) without abnormal findings: Secondary | ICD-10-CM | POA: Diagnosis not present

## 2017-04-14 LAB — HM MAMMOGRAPHY

## 2017-04-14 LAB — HM PAP SMEAR: HM PAP: NEGATIVE

## 2017-04-14 MED ORDER — GABAPENTIN 100 MG PO CAPS: ORAL_CAPSULE | ORAL | 2 refills | Status: DC

## 2017-04-18 ENCOUNTER — Encounter: Payer: Self-pay | Admitting: Family Medicine

## 2017-04-24 ENCOUNTER — Ambulatory Visit (INDEPENDENT_AMBULATORY_CARE_PROVIDER_SITE_OTHER): Payer: BLUE CROSS/BLUE SHIELD | Admitting: Family Medicine

## 2017-04-24 ENCOUNTER — Encounter: Payer: Self-pay | Admitting: Family Medicine

## 2017-04-24 VITALS — BP 118/82 | HR 78 | Temp 97.4°F | Ht 66.0 in | Wt 286.8 lb

## 2017-04-24 DIAGNOSIS — E785 Hyperlipidemia, unspecified: Secondary | ICD-10-CM

## 2017-04-24 DIAGNOSIS — G2581 Restless legs syndrome: Secondary | ICD-10-CM | POA: Insufficient documentation

## 2017-04-24 DIAGNOSIS — Z23 Encounter for immunization: Secondary | ICD-10-CM

## 2017-04-24 DIAGNOSIS — F325 Major depressive disorder, single episode, in full remission: Secondary | ICD-10-CM

## 2017-04-24 DIAGNOSIS — E039 Hypothyroidism, unspecified: Secondary | ICD-10-CM | POA: Diagnosis not present

## 2017-04-24 MED ORDER — GABAPENTIN 300 MG PO CAPS
ORAL_CAPSULE | ORAL | 3 refills | Status: DC
Start: 2017-04-24 — End: 2018-01-26

## 2017-04-24 NOTE — Assessment & Plan Note (Signed)
S: Lab Results  Component Value Date   TSH 2.20 03/31/2017   On thyroid medication 280mg--> 250 mcg A/P: well controlled- continue current meds

## 2017-04-24 NOTE — Patient Instructions (Addendum)
Try iron every other day- iron stores are low which can make restless legs worse.   Can use 1-2 of the 324m gabapentin tablets each evening  Continue effexor 262mtwice a day- seems to be working much better  Lets check in 3-4 months from now to see how things are going from weight perspective, depression perspective

## 2017-04-24 NOTE — Assessment & Plan Note (Signed)
S:  Changed to celexa 22m last year due to cost of effexor last year. She changed back due to phq9 of 19 plus weight gain concerns- she feels much better already.   PHQ9 2 today!  A/P: continue current medicine- much improved

## 2017-04-24 NOTE — Assessment & Plan Note (Signed)
S: luckily controlled after bypass Lab Results  Component Value Date   CHOL 169 03/31/2017   HDL 43.30 03/31/2017   LDLCALC 99 03/31/2017   LDLDIRECT 96.0 05/23/2014   TRIG 135.0 03/31/2017   CHOLHDL 4 03/31/2017   A/P: continue without rx

## 2017-04-24 NOTE — Progress Notes (Signed)
Subjective:  Kara [name] is a 42 y.o. year old very pleasant female patient who presents for/with See problem oriented charting ROS- no chest pain or shortness of breath. Some right arm pain with holding phone- no exertional component. No SI. Some anhedonia but much improved   Past Medical History-  Patient Active Problem List   Diagnosis Date Noted  . 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 Restless legs syndrome 04/24/2017    Priority: Medium  . Depression 02/22/2010    Priority: Medium  . OBESITY 06/16/2008    Priority: Medium  . Hyperlipidemia 06/11/2007    Priority: Medium  . Hypothyroidism 08/18/2006    Priority: Medium  . Allergic rhinitis 02/03/2014    Priority: Low  . Vitamin D deficiency 02/03/2014    Priority: Low  . BPPV (benign paroxysmal positional vertigo) 04/19/2013    Priority: Low  . Osteoarthritis of both knees 04/19/2013    Priority: Low  . DJD (degenerative joint disease) 10/26/2009    Priority: Low    Medications- reviewed and updated Current Outpatient Medications  Medication Sig Dispense Refill  . acetaminophen (TYLENOL) 325 MG tablet Take 650 mg by mouth every 6 (six) hours as needed for mild pain or moderate pain.    . calcium carbonate (OS-CAL - DOSED IN MG OF ELEMENTAL CALCIUM) 1250 (500 Ca) MG tablet Take 1 tablet by mouth 3 (three) times daily with meals.    . gabapentin (NEURONTIN) 100 MG capsule TAKE 1-3 CAPSULES BY MOUTH EVERY NIGHT AT BEDTIME 90 capsule 2  . glucosamine-chondroitin 500-400 MG tablet Take 1 tablet by mouth daily.    Marland Kitchen levothyroxine (SYNTHROID, LEVOTHROID) 125 MCG tablet Take 2 tablets (250 mcg total) daily by mouth. 180 tablet 3  . medroxyPROGESTERone (DEPO-PROVERA) 150 MG/ML injection Inject 150 mg into the muscle every 3 (three) months. Last dose was 12/10/12    . Menthol (BIOFREEZE) 10.5 % AERO Apply topically.    . Pediatric Multiple  Vit-C-FA (FLINSTONES GUMMIES OMEGA-3 DHA) CHEW Chew 1 each by mouth 2 (two) times daily.     . traMADol (ULTRAM) 50 MG tablet Take 1 tablet (50 mg total) by mouth 3 (three) times daily as needed. 90 tablet 1  . Turmeric 500 MG CAPS Take 1 capsule by mouth daily.    Marland Kitchen venlafaxine (EFFEXOR) 25 MG tablet Take 1 tablet (25 mg total) by mouth 2 (two) times daily. 60 tablet 1  . vitamin B-12 (CYANOCOBALAMIN) 500 MCG tablet Take 500 mcg by mouth daily.     No current facility-administered medications for this visit.     Objective: BP 118/82 (BP Location: Right Arm, Patient Position: Sitting, Cuff Size: Large)   Pulse 78   Temp (!) 97.4 F (36.3 C) (Oral)   Ht 5' 6"  (1.676 m)   Wt 286 lb 12.8 oz (130.1 kg)   SpO2 99%   BMI 46.29 kg/m  Gen: NAD, resting comfortably CV: RRR no murmurs rubs or gallops Lungs: CTAB no crackles, wheeze, rhonchi Abdomen: soft/nontender/nondistended/normal bowel sounds. Obese Ext: no edema Skin: warm, dry  Assessment/Plan:  Other issues 1. History gastric bypass- still taking b12, flinstones MV, calcium. Weight continues to go up from last year - has had a lot of stressors with wife with MI recently. Has lost 3 lbs over last month fortunately. 2 years ago weight 215.  2. OA right knee- better after knee replacement- still  issues with left knee nad right hip.  Recent shot with Dr. Sharol Given 3. Right arm pain if playing phone or sleeps on it funny  Hyperlipidemia S: luckily controlled after bypass Lab Results  Component Value Date   CHOL 169 03/31/2017   HDL 43.30 03/31/2017   LDLCALC 99 03/31/2017   LDLDIRECT 96.0 05/23/2014   TRIG 135.0 03/31/2017   CHOLHDL 4 03/31/2017   A/P: continue without rx  Hypothyroidism S: Lab Results  Component Value Date   TSH 2.20 03/31/2017   On thyroid medication 25mg--> 250 mcg A/P: well controlled- continue current meds  Restless legs syndrome Ferritin is somewhat low- gabapentin helping reasonably. Will change to  gabapentin 3047mtablets- can take 1-2. encouaged iron intake   Depression S:  Changed to celexa 2053mast year due to cost of effexor last year. She changed back due to phq9 of 19 plus weight gain concerns- she feels much better already.   PHQ9 2 today!  A/P: continue current medicine- much improved  3-4 month advised  Lab/Order associations: Need for prophylactic vaccination with combined diphtheria-tetanus-pertussis (DTP) vaccine - Plan: Tdap vaccine greater than or equal to 7yo IM   Meds ordered this encounter  Medications  . gabapentin (NEURONTIN) 300 MG capsule    Sig: TAKE 1-2 CAPSULES BY MOUTH EVERY NIGHT AT BEDTIME    Dispense:  180 capsule    Refill:  3    Return precautions advised.  SteGarret ReddishD

## 2017-04-24 NOTE — Assessment & Plan Note (Signed)
Ferritin is somewhat low- gabapentin helping reasonably. Will change to gabapentin 336m tablets- can take 1-2. encouaged iron intake

## 2017-05-28 ENCOUNTER — Other Ambulatory Visit: Payer: Self-pay | Admitting: Family Medicine

## 2017-05-28 ENCOUNTER — Encounter: Payer: Self-pay | Admitting: Family Medicine

## 2017-05-29 ENCOUNTER — Other Ambulatory Visit: Payer: Self-pay | Admitting: Physician Assistant

## 2017-06-03 ENCOUNTER — Other Ambulatory Visit: Payer: Self-pay

## 2017-06-03 MED ORDER — VENLAFAXINE HCL 25 MG PO TABS: 25.0000 mg | ORAL_TABLET | Freq: Two times a day (BID) | ORAL | 5 refills | Status: DC

## 2017-06-08 ENCOUNTER — Other Ambulatory Visit: Payer: Self-pay | Admitting: Family Medicine

## 2017-07-02 DIAGNOSIS — Z3042 Encounter for surveillance of injectable contraceptive: Secondary | ICD-10-CM | POA: Diagnosis not present

## 2017-07-25 ENCOUNTER — Ambulatory Visit (INDEPENDENT_AMBULATORY_CARE_PROVIDER_SITE_OTHER): Payer: BLUE CROSS/BLUE SHIELD | Admitting: Family Medicine

## 2017-07-25 ENCOUNTER — Encounter: Payer: Self-pay | Admitting: Family Medicine

## 2017-07-25 VITALS — BP 122/68 | HR 93 | Temp 98.3°F | Ht 66.0 in | Wt 280.0 lb

## 2017-07-25 DIAGNOSIS — F325 Major depressive disorder, single episode, in full remission: Secondary | ICD-10-CM

## 2017-07-25 DIAGNOSIS — M170 Bilateral primary osteoarthritis of knee: Secondary | ICD-10-CM | POA: Diagnosis not present

## 2017-07-25 DIAGNOSIS — E039 Hypothyroidism, unspecified: Secondary | ICD-10-CM

## 2017-07-25 DIAGNOSIS — G2581 Restless legs syndrome: Secondary | ICD-10-CM | POA: Diagnosis not present

## 2017-07-25 MED ORDER — TRAMADOL HCL 50 MG PO TABS: 50.0000 mg | ORAL_TABLET | Freq: Three times a day (TID) | ORAL | 5 refills | Status: DC | PRN

## 2017-07-25 NOTE — Assessment & Plan Note (Signed)
S: On thyroid medication-levothyroxine 125x2 for 250 mcg total mcg   Lab Results  Component Value Date   TSH 2.20 03/31/2017   A/P: continue current rx

## 2017-07-25 NOTE — Assessment & Plan Note (Signed)
6S: restless legs- using gabapentin 636m at bedtime-reasonable control- issues at times even on this. Keeping up MV with iron as well A/P: continue current meds

## 2017-07-25 NOTE — Assessment & Plan Note (Signed)
S: using tramadol fo r her knees when working out- 3x a day MWF, then twice a day the other days. nsaids not ideal given bypass. Following up with ortho- injections at times for knees.  A/P: refill tramadol. Check nccsrs next visit

## 2017-07-25 NOTE — Progress Notes (Signed)
Subjective:  Kara [name] is a 42 y.o. year old very pleasant female patient who presents for/with See problem oriented charting ROS- knee pain. No chest pain or shortness of breath. No headache or blurry vision.    Past Medical History-  Patient Active Problem List   Diagnosis Date Noted  . 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 Restless legs syndrome 04/24/2017    Priority: Medium  . Depression 02/22/2010    Priority: Medium  . OBESITY 06/16/2008    Priority: Medium  . Hyperlipidemia 06/11/2007    Priority: Medium  . Hypothyroidism 08/18/2006    Priority: Medium  . Allergic rhinitis 02/03/2014    Priority: Low  . Vitamin D deficiency 02/03/2014    Priority: Low  . BPPV (benign paroxysmal positional vertigo) 04/19/2013    Priority: Low  . Osteoarthritis of both knees 04/19/2013    Priority: Low  . DJD (degenerative joint disease) 10/26/2009    Priority: Low    Medications- reviewed and updated Current Outpatient Medications  Medication Sig Dispense Refill  . acetaminophen (TYLENOL) 325 MG tablet Take 650 mg by mouth every 6 (six) hours as needed for mild pain or moderate pain.    . calcium carbonate (OS-CAL - DOSED IN MG OF ELEMENTAL CALCIUM) 1250 (500 Ca) MG tablet Take 1 tablet by mouth 3 (three) times daily with meals.    . gabapentin (NEURONTIN) 300 MG capsule TAKE 1-2 CAPSULES BY MOUTH EVERY NIGHT AT BEDTIME 180 capsule 3  . glucosamine-chondroitin 500-400 MG tablet Take 1 tablet by mouth daily.    Marland Kitchen levothyroxine (SYNTHROID, LEVOTHROID) 125 MCG tablet Take 2 tablets (250 mcg total) daily by mouth. 180 tablet 3  . medroxyPROGESTERone (DEPO-PROVERA) 150 MG/ML injection Inject 150 mg into the muscle every 3 (three) months. Last dose was 12/10/12    . Menthol (BIOFREEZE) 10.5 % AERO Apply topically.    . Pediatric Multiple Vit-C-FA (FLINSTONES GUMMIES OMEGA-3 DHA) CHEW Chew 1 each by  mouth 2 (two) times daily.     . traMADol (ULTRAM) 50 MG tablet Take 1 tablet (50 mg total) by mouth 3 (three) times daily as needed. 90 tablet 5  . Turmeric 500 MG CAPS Take 1 capsule by mouth daily.    Marland Kitchen venlafaxine (EFFEXOR) 25 MG tablet Take 1 tablet (25 mg total) by mouth 2 (two) times daily. 60 tablet 5  . vitamin B-12 (CYANOCOBALAMIN) 500 MCG tablet Take 500 mcg by mouth daily.     No current facility-administered medications for this visit.     Objective: BP 122/68 (BP Location: Left Arm, Patient Position: Sitting, Cuff Size: Normal)   Pulse 93   Temp 98.3 F (36.8 C) (Oral)   Ht 5' 6"  (1.676 m)   Wt 280 lb (127 kg)   SpO2 97%   BMI 45.19 kg/m  Gen: NAD, resting comfortably CV: RRR no murmurs rubs or gallops Lungs: CTAB no crackles, wheeze, rhonchi Abdomen: soft/nontender/nondistended/normal bowel sounds. No rebound or guarding.  Ext: no edema Skin: warm, dry  Assessment/Plan:  Other notes: 1. Patient down 6 lbs over last few months! She thinks the only change has been the change back to effexor. Depo provera due to very severe menstrual cramps 2. Vertigo/BPPV recurred last week turning to right- doing OTC meclizine. Improving- declines PT 3. Had to use her boot recently for right ankle- that has now resolved  Hypothyroidism S: On  thyroid medication-levothyroxine 125x2 for 250 mcg total mcg   Lab Results  Component Value Date   TSH 2.20 03/31/2017   A/P: continue current rx  Depression S: controlled with PHQ9 of 1 today on effexor 15m BID- cost is better now with insurance. Weight loss now off celexa A/P: continue current rx- discussed serotonin syndrome risk with tramadol  Restless legs syndrome 6S: restless legs- using gabapentin 6025mat bedtime-reasonable control- issues at times even on this. Keeping up MV with iron as well A/P: continue current meds  Osteoarthritis of both knees S: using tramadol fo r her knees when working out- 3x a day MWF, then twice  a day the other days. nsaids not ideal given bypass. Following up with ortho- injections at times for knees.  A/P: refill tramadol. Check nccsrs next visit  Return in about 6 months (around 01/24/2018) for physical.  Meds ordered this encounter  Medications  . traMADol (ULTRAM) 50 MG tablet    Sig: Take 1 tablet (50 mg total) by mouth 3 (three) times daily as needed.    Dispense:  90 tablet    Refill:  5    Return precautions advised.  StGarret ReddishMD

## 2017-07-25 NOTE — Patient Instructions (Addendum)
No changes today  Great job with the weight loss- glad switching meds back has helped you  Refilled tramadol today for 6 months  Serotonin Syndrome- risk with combo of tramadol and venlafaxine Serotonin is a brain chemical that regulates the nervous system, which includes the brain, spinal cord, and nerves. Serotonin appears to play a role in all types of behavior, including appetite, emotions, movement, thinking, and response to stress. Excessively high levels of serotonin in the body can cause serotonin syndrome, which is a very dangerous condition. What are the causes? This condition can be caused by taking medicines or drugs that increase the level of serotonin in your body. These include:  Antidepressant medicines.  Migraine medicines.  Certain pain medicines.  Certain recreational drugs, including ecstasy, LSD, cocaine, and amphetamines.  Over-the-counter cough or cold medicines that contain dextromethorphan.  Certain herbal supplements, including St. John's wort, ginseng, and nutmeg.  This condition usually occurs when you take these medicines or drugs in combination, but it can also happen with a high dose of a single medicine or drug. What increases the risk? This condition is more likely to develop in:  People who have recently increased the dosage of medicine that increases the serotonin level.  People who just started taking medicine that increases the serotonin level.  What are the signs or symptoms? Symptoms of this condition usually happens within several hours of a medicine change. Symptoms include:  Headache.  Muscle twitching or stiffness.  Diarrhea.  Confusion.  Restlessness or agitation.  Shivering or goose bumps.  Loss of muscle coordination.  Rapid heart rate.  Sweating.  Severe cases of serotonin syndromecan cause:  Irregular heartbeat.  Seizures.  Loss of consciousness.  High fever.  How is this diagnosed? This condition is  diagnosed with a medical history and physical exam. You will be asked aboutyour symptoms and your use of medicines and recreational drugs. Your health care provider may also order lab work or additional tests to rule out other causes of your symptoms. How is this treated? The treatment for this condition depends on the severity of your symptoms. For mild cases, stopping the medicine that caused your condition is usually all that is needed. For moderate to severe cases, hospitalization is required to monitor you and to prevent further muscle damage. Follow these instructions at home:  Take over-the-counter and prescription medicines only as told by your health care provider. This is important.  Check with your health care provider before you start taking any new prescriptions, over-the-counter medicines, herbs, or supplements.  Avoid combining any medicines that can cause this condition to occur.  Keep all follow-up visits as told by your health care provider.This is important.  Maintain a healthy lifestyle. ? Eat healthy foods. ? Get plenty of sleep. ? Exercise regularly. ? Do not drink alcohol. ? Do not use recreational drugs. Contact a health care provider if:  Medicines do not seem to be helping.  Your symptoms do not improve or they get worse.  You have trouble taking care of yourself. Get help right away if:  You have worsening confusion, severe headache, chest pain, high fever, seizures, or loss of consciousness.  You have serious thoughts about hurting yourself or others.  You experience serious side effects of medicine, such as swelling of your face, lips, tongue, or throat. This information is not intended to replace advice given to you by your health care provider. Make sure you discuss any questions you have with your health care provider.  Document Released: 02/22/2004 Document Revised: 09/09/2015 Document Reviewed: 01/27/2014 Elsevier Interactive Patient Education   [employer].

## 2017-07-25 NOTE — Assessment & Plan Note (Signed)
S: controlled with PHQ9 of 1 today on effexor 71m BID- cost is better now with insurance. Weight loss now off celexa A/P: continue current rx- discussed serotonin syndrome risk with tramadol

## 2017-08-14 ENCOUNTER — Telehealth: Payer: Self-pay

## 2017-08-14 NOTE — Telephone Encounter (Signed)
PA submitted for traMADol (ULTRAM) 50 MG tablet  Approvedtoday  Questionnaire submitted. PA Case [id number] Status: Approved. Authorization and Notifications Completed.   Pharmacy Notified

## 2017-08-15 ENCOUNTER — Telehealth: Payer: Self-pay

## 2017-08-15 NOTE — Telephone Encounter (Signed)
[facility] faxed over the approval of Tramadol HCL 50 MG from 08/14/2017-08/14/2018.

## 2017-08-29 ENCOUNTER — Encounter (INDEPENDENT_AMBULATORY_CARE_PROVIDER_SITE_OTHER): Payer: Self-pay | Admitting: Orthopedic Surgery

## 2017-09-22 DIAGNOSIS — Z3042 Encounter for surveillance of injectable contraceptive: Secondary | ICD-10-CM | POA: Diagnosis not present

## 2017-12-10 DIAGNOSIS — Z3042 Encounter for surveillance of injectable contraceptive: Secondary | ICD-10-CM | POA: Diagnosis not present

## 2017-12-12 ENCOUNTER — Encounter: Payer: Self-pay | Admitting: Family Medicine

## 2017-12-12 ENCOUNTER — Other Ambulatory Visit: Payer: Self-pay | Admitting: *Deleted

## 2017-12-12 MED ORDER — LEVOTHYROXINE SODIUM 125 MCG PO TABS: 250.0000 ug | ORAL_TABLET | Freq: Every day | ORAL | 0 refills | Status: DC

## 2017-12-24 ENCOUNTER — Ambulatory Visit (INDEPENDENT_AMBULATORY_CARE_PROVIDER_SITE_OTHER): Payer: BLUE CROSS/BLUE SHIELD | Admitting: Family

## 2017-12-24 ENCOUNTER — Encounter (INDEPENDENT_AMBULATORY_CARE_PROVIDER_SITE_OTHER): Payer: Self-pay | Admitting: Family

## 2017-12-24 VITALS — Ht 66.0 in | Wt 280.0 lb

## 2017-12-24 DIAGNOSIS — M1712 Unilateral primary osteoarthritis, left knee: Secondary | ICD-10-CM

## 2017-12-24 MED ORDER — METHYLPREDNISOLONE ACETATE 40 MG/ML IJ SUSP
40.0000 mg | INTRAMUSCULAR | Status: AC | PRN
  Administered 2017-12-24: 40 mg via INTRA_ARTICULAR

## 2017-12-24 MED ORDER — LIDOCAINE HCL 1 % IJ SOLN
5.0000 mL | INTRAMUSCULAR | Status: AC | PRN
  Administered 2017-12-24: 5 mL

## 2017-12-24 NOTE — Progress Notes (Signed)
Office Visit Note   Patient: Kara [name]           Date of Birth: [birth year]           MRN: [id number] Visit Date: 12/24/2017              Requested by: Marin Olp, MD [place], [place] [id number] PCP: Marin Olp, MD  Chief Complaint  Patient presents with  . Left Knee - Follow-up    Left Knee Cortisone Injection      HPI: The patient is a 42 year old woman seen today for evaluation of left knee pain. This is acute on chronic. Aching and throbbing pain. Worse with ambulation and squatting. Has begun having pain jumping in the pool for her water aerobics and during the class. No locking or catching.   Last depomedrol injection was last January. States worked well until last few months.   Denies edema.   Assessment & Plan: Visit Diagnoses:  1. Primary osteoarthritis of left knee     Plan: depomedrol injection left knee. Tolerated well. Follow up as needed.   Follow-Up Instructions: Return if symptoms worsen or fail to improve.   Left Knee Exam   Muscle Strength  The patient has normal left knee strength.  Tenderness  The patient is experiencing tenderness in the lateral joint line.  Range of Motion  The patient has normal left knee ROM.  Tests  Varus: negative Valgus: negative  Other  Erythema: absent Swelling: none      Patient is alert, oriented, no adenopathy, well-dressed, normal affect, normal respiratory effort.   Imaging: No results found. No images are attached to the encounter.  Labs: Lab Results  Component Value Date   HGBA1C 5.4 05/18/2013   HGBA1C 5.6 06/13/2011   REPTSTATUS 05/03/2012 FINAL 04/30/2012   CULT  04/30/2012    NO STAPHYLOCOCCUS AUREUS ISOLATED Note: No MRSA Isolated     Lab Results  Component Value Date   ALBUMIN 3.8 03/31/2017   ALBUMIN 4.0 03/13/2016   ALBUMIN 3.3 (L) 03/08/2015    Body mass index is 45.19 kg/m.  Orders:  No orders of the defined types were placed in this  encounter.  No orders of the defined types were placed in this encounter.    Procedures: Large Joint Inj: L knee on 12/24/2017 10:43 AM Indications: pain Details: 18 G 1.5 in needle, anteromedial approach Medications: 5 mL lidocaine 1 %; 40 mg methylPREDNISolone acetate 40 MG/ML Consent was given by the patient.      Clinical Data: No additional findings.  ROS:  All other systems negative, except as noted in the HPI. Review of Systems  Constitutional: Negative for chills and fever.  Musculoskeletal: Positive for arthralgias and gait problem. Negative for joint swelling.    Objective: Vital Signs: Ht 5' 6"  (1.676 m)   Wt 280 lb (127 kg)   BMI 45.19 kg/m   Specialty Comments:  No specialty comments available.  PMFS History: Patient Active Problem List   Diagnosis Date Noted  . Restless legs syndrome 04/24/2017  . 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 Allergic rhinitis 02/03/2014  . Vitamin D deficiency 02/03/2014  . BPPV (benign paroxysmal positional vertigo) 04/19/2013  . Osteoarthritis of both knees 04/19/2013  . 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 Depression 02/22/2010  . DJD (degenerative joint disease) 10/26/2009  . OBESITY 06/16/2008  . Hyperlipidemia 06/11/2007  . Hypothyroidism 08/18/2006   Past Medical History:  Diagnosis Date  . ANXIETY 08/18/2006  . 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 DEPRESSION 02/22/2010  . GERD (gastroesophageal reflux disease)    history of  . HYPERLIPIDEMIA 06/11/2007  . HYPOTHYROIDISM 08/18/2006  . OBESITY 06/16/2008  . Pneumonia    hx of   . PONV (postoperative nausea and vomiting)    used Scopolamine patch    Family History  Problem Relation Age of Onset  . Schizophrenia Mother   . Cancer Mother        lung, smoker  . Breast cancer Mother 56  . Uterine cancer Mother   . Heart disease Father 24       smoker at time    Past Surgical History:  Procedure Laterality Date  .  CHOLECYSTECTOMY N/A 05/08/2012   Procedure: LAPAROSCOPIC CHOLECYSTECTOMY WITH INTRAOPERATIVE CHOLANGIOGRAM;  Surgeon: Leighton Ruff, MD;  Location: [facility];  Service: General;  Laterality: N/A;  . FRACTURE SURGERY    . GASTRIC ROUX-EN-Y  12/29/2012   Procedure: LAPAROSCOPIC ROUX-EN-Y GASTRIC BYPASS WITH UPPER ENDOSCOPY;  Surgeon: Madilyn Hook, DO;  Location: [facility];  Service: General;;  . HARDWARE REMOVAL Right 06/14/2015   Procedure: Removal Internal Fixation Right Lateral Tibial Plateau;  Surgeon: [name], MD;  Location: [place];  Service: Orthopedics;  Laterality: Right;  . HERNIA REPAIR    . KNEE ARTHROSCOPY Right 07/28/12   dr. Sharol Given  . ORIF TIBIA PLATEAU Right 03/10/2015   Procedure: OPEN REDUCTION INTERNAL FIXATION (ORIF) LATERAL TIBIAL PLATEAU;  Surgeon: [name], MD;  Location: [place];  Service: Orthopedics;  Laterality: Right;  . ORIF ULNAR FRACTURE Right 1995   plates and pins  . TOTAL KNEE ARTHROPLASTY Right 06/14/2015  . TOTAL KNEE ARTHROPLASTY Right 06/14/2015   Procedure: RIGHT TOTAL KNEE ARTHROPLASTY;  Surgeon: [name], MD;  Location: [place];  Service: Orthopedics;  Laterality: Right;  . UPPER GI ENDOSCOPY  12/29/2012   Procedure: UPPER GI ENDOSCOPY;  Surgeon: Madilyn Hook, DO;  Location: [facility];  Service: General;;   Social History   Occupational History  . Not on file  Tobacco Use  . Smoking status: Former Smoker    Types: Cigarettes    Last attempt to quit: 01/28/2005    Years since quitting: 12.9  . Smokeless tobacco: Never Used  Substance and Sexual Activity  . Alcohol use: No    Comment: not since gastric bypass  . Drug use: No  . Sexual activity: Not on file

## 2018-01-23 ENCOUNTER — Other Ambulatory Visit: Payer: Self-pay | Admitting: Family Medicine

## 2018-01-26 ENCOUNTER — Ambulatory Visit (INDEPENDENT_AMBULATORY_CARE_PROVIDER_SITE_OTHER): Payer: BLUE CROSS/BLUE SHIELD | Admitting: Family Medicine

## 2018-01-26 ENCOUNTER — Ambulatory Visit: Payer: Self-pay

## 2018-01-26 ENCOUNTER — Encounter: Payer: Self-pay | Admitting: Sports Medicine

## 2018-01-26 ENCOUNTER — Ambulatory Visit (INDEPENDENT_AMBULATORY_CARE_PROVIDER_SITE_OTHER): Payer: BLUE CROSS/BLUE SHIELD | Admitting: Sports Medicine

## 2018-01-26 ENCOUNTER — Encounter: Payer: Self-pay | Admitting: Family Medicine

## 2018-01-26 VITALS — BP 114/88 | HR 82 | Temp 98.1°F | Ht 66.0 in | Wt 286.0 lb

## 2018-01-26 VITALS — BP 114/88 | HR 82 | Ht 66.0 in | Wt 286.0 lb

## 2018-01-26 DIAGNOSIS — G2589 Other specified extrapyramidal and movement disorders: Secondary | ICD-10-CM

## 2018-01-26 DIAGNOSIS — G2581 Restless legs syndrome: Secondary | ICD-10-CM | POA: Diagnosis not present

## 2018-01-26 DIAGNOSIS — M25511 Pain in right shoulder: Secondary | ICD-10-CM

## 2018-01-26 DIAGNOSIS — M170 Bilateral primary osteoarthritis of knee: Secondary | ICD-10-CM | POA: Diagnosis not present

## 2018-01-26 DIAGNOSIS — M75101 Unspecified rotator cuff tear or rupture of right shoulder, not specified as traumatic: Secondary | ICD-10-CM

## 2018-01-26 DIAGNOSIS — Z23 Encounter for immunization: Secondary | ICD-10-CM

## 2018-01-26 DIAGNOSIS — F324 Major depressive disorder, single episode, in partial remission: Secondary | ICD-10-CM

## 2018-01-26 DIAGNOSIS — M7551 Bursitis of right shoulder: Secondary | ICD-10-CM

## 2018-01-26 DIAGNOSIS — E039 Hypothyroidism, unspecified: Secondary | ICD-10-CM

## 2018-01-26 LAB — TSH

## 2018-01-26 MED ORDER — VENLAFAXINE HCL ER 75 MG PO CP24: 75.0000 mg | ORAL_CAPSULE | Freq: Every day | ORAL | 3 refills | Status: DC

## 2018-01-26 MED ORDER — GABAPENTIN 300 MG PO CAPS: 900.0000 mg | ORAL_CAPSULE | Freq: Every day | ORAL | 3 refills | Status: DC

## 2018-01-26 NOTE — Progress Notes (Signed)
PROCEDURE NOTE: THERAPEUTIC EXERCISES (97110) 15 minutes spent for Therapeutic exercises as below and as referenced in the AVS.  This included exercises focusing on stretching, strengthening, with significant focus on eccentric aspects.   Proper technique shown and discussed handout in great detail with ATC.  All questions were discussed and answered.   Long term goals include an improvement in range of motion, strength, endurance as well as avoiding reinjury. Frequency of visits is one time as determined during today's  office visit. Frequency of exercises to be performed is as per handout.  EXERCISES REVIEWED: Intrinsic Rotator Cuff Exercises Scapular Stabilization

## 2018-01-26 NOTE — Procedures (Signed)
PROCEDURE NOTE:  Ultrasound Guided: Injection: Right shoulder Images were obtained and interpreted by myself, Teresa Coombs, DO  Images have been saved and stored to PACS system. Images obtained on: GE S7 Ultrasound machine    ULTRASOUND FINDINGS:  Ultimately does appear that she has a subacromial bursa as well as interstitial splitting of the supraspinatus and superior fibers of the subscap.  Biceps tendon appears to be intact.  DESCRIPTION OF PROCEDURE:  The patient's clinical condition is marked by substantial pain and/or significant functional disability. Other conservative therapy has not provided relief, is contraindicated, or not appropriate. There is a reasonable likelihood that injection will significantly improve the patient's pain and/or functional impairment.   After discussing the risks, benefits and expected outcomes of the injection and all questions were reviewed and answered, the patient wished to undergo the above named procedure.  Verbal consent was obtained.  The ultrasound was used to identify the target structure and adjacent neurovascular structures. The skin was then prepped in sterile fashion and the target structure was injected under direct visualization using sterile technique as below:  Single injection performed as below: PREP: Alcohol and Ethel Chloride APPROACH:direct, single injection, 21g 2 in. INJECTATE: 2 cc 0.5% Marcaine and 1 cc 94m/mL DepoMedrol ASPIRATE: None DRESSING: Band-Aid  Post procedural instructions including recommending icing and warning signs for infection were reviewed.    This procedure was well tolerated and there were no complications.   IMPRESSION: Succesful Ultrasound Guided: Injection

## 2018-01-26 NOTE — Assessment & Plan Note (Signed)
S: Patient is doing reasonably well on Effexor 25 mg twice daily.  Felt like she had more difficulty with weight gain on Celexa.  PHQ 9 of 9 today- has had some struggles with work, wife short term was thinking about suicide, Holidays without her father around. No Si.  A/P: worsening symptoms- wants to trial capsule daily again anyway for venlafaxine- We will go back to 75 mg extended release and recheck in 2 months. She will let us know if any thoughts of self harm with dosage change.

## 2018-01-26 NOTE — Assessment & Plan Note (Signed)
S: Patient continued issues with knee pain.  She uses tramadol 3 times a day on Monday Wednesday Friday when working out and then twice a day on other days.  NSAIDs are to be avoided given by past history.  She follows with orthopedics and gets injections at times. A/P: stable- continue current medications

## 2018-01-26 NOTE — Progress Notes (Signed)
Subjective:  Kara [name] is a 42 y.o. year old very pleasant female patient who presents for/with See problem oriented charting ROS- bilateral knee pain. Right shoulder pain. No chest pain or shortness ofbreath. Depressed mood and weight gain noted   Past Medical History-  Patient Active Problem List   Diagnosis Date Noted  . 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 Restless legs syndrome 04/24/2017    Priority: Medium  . Depression 02/22/2010    Priority: Medium  . OBESITY 06/16/2008    Priority: Medium  . Hyperlipidemia 06/11/2007    Priority: Medium  . Hypothyroidism 08/18/2006    Priority: Medium  . Allergic rhinitis 02/03/2014    Priority: Low  . Vitamin D deficiency 02/03/2014    Priority: Low  . BPPV (benign paroxysmal positional vertigo) 04/19/2013    Priority: Low  . Osteoarthritis of both knees 04/19/2013    Priority: Low  . DJD (degenerative joint disease) 10/26/2009    Priority: Low    Medications- reviewed and updated Current Outpatient Medications  Medication Sig Dispense Refill  . acetaminophen (TYLENOL) 325 MG tablet Take 650 mg by mouth 3 (three) times daily.     . calcium carbonate (OS-CAL - DOSED IN MG OF ELEMENTAL CALCIUM) 1250 (500 Ca) MG tablet Take 1 tablet by mouth 3 (three) times daily with meals.    Marland Kitchen Carbonyl Iron (IRON CHEWS PEDIATRIC PO) Take 1 tablet by mouth daily.    Marland Kitchen gabapentin (NEURONTIN) 300 MG capsule Take 3 capsules (900 mg total) by mouth at bedtime. 270 capsule 3  . glucosamine-chondroitin 500-400 MG tablet Take 1 tablet by mouth daily.    Marland Kitchen levothyroxine (SYNTHROID, LEVOTHROID) 125 MCG tablet Take 2 tablets (250 mcg total) by mouth daily. 180 tablet 0  . medroxyPROGESTERone (DEPO-PROVERA) 150 MG/ML injection Inject 150 mg into the muscle every 3 (three) months. Last dose was 12/10/12    . Menthol (BIOFREEZE) 10.5 % AERO Apply topically.    .  Multiple Vitamins-Minerals (BARIATRIC MULTIVITAMINS/IRON PO) Take 1 tablet by mouth 2 (two) times daily.    . traMADol (ULTRAM) 50 MG tablet TAKE 1 TABLET(50 MG) BY MOUTH THREE TIMES DAILY AS NEEDED 90 tablet 1  . vitamin B-12 (CYANOCOBALAMIN) 500 MCG tablet Take 500 mcg by mouth daily.    Marland Kitchen venlafaxine XR (EFFEXOR-XR) 75 MG 24 hr capsule Take 1 capsule (75 mg total) by mouth daily with breakfast. 90 capsule 3   No current facility-administered medications for this visit.     Objective: BP 114/88 (BP Location: Left Arm, Patient Position: Sitting, Cuff Size: Large)   Pulse 82   Temp 98.1 F (36.7 C) (Oral)   Ht 5' 6"  (1.676 m)   Wt 286 lb (129.7 kg)   SpO2 98%   BMI 46.16 kg/m  Gen: NAD, resting comfortably CV: RRR no murmurs rubs or gallops Lungs: CTAB no crackles, wheeze, rhonchi Abdomen: soft/nontender/nondistended/obese Ext: no edema Skin: warm, dry  Right Shoulder: Inspection reveals no abnormalities, atrophy or asymmetry. ROM is full in all planes. Rotator cuff strength normal throughout. Slight limitation on empty can due to pain.  Signs of impingement with positive Neer and Hawkin's tests, empty can. no drop arm sign.   Assessment/Plan:   Other notes: 1.  Patient unfortunately up 6 pounds after losing 6 pounds last visit. Rarely can eat at work and often has to eat at midnight which she  finds challenging  2. Right shoulder pain for about a month. Taking tylenol 3x a day along with her tramadol. Refer to Dr. Paulla Fore today  Hypothyroidism S: On thyroid medication-now up to 250 mcg total daily-uses 2 of the 125 mg levothyroxine per day Lab Results  Component Value Date   TSH 2.20 03/31/2017  A/P: Hopefully stable-update TSH  Depression S: Patient is doing reasonably well on Effexor 25 mg twice daily.  Felt like she had more difficulty with weight gain on Celexa.  PHQ 9 of 9 today- has had some struggles with work, wife short term was thinking about suicide, Holidays  without her father around. No Si.  A/P: worsening symptoms- wants to trial capsule daily again anyway for venlafaxine- We will go back to 75 mg extended release and recheck in 2 months. She will let us know if any thoughts of self harm with dosage change.   Restless legs syndrome S: Patient with restless legs.  She is on gabapentin 900 mg at bedtime-reasonable control with this.  She is on a bariatric vitamin as well as additional iron as well Lab Results  Component Value Date   FERRITIN 15 03/31/2017  A/P: Stable- need to get ferritin over 75 if possible.   Osteoarthritis of both knees S: Patient continued issues with knee pain.  She uses tramadol 3 times a day on Monday Wednesday Friday when working out and then twice a day on other days.  NSAIDs are to be avoided given by past history.  She follows with orthopedics and gets injections at times. A/P: stable- continue current medications  Future Appointments  Date Time Provider [facility]  01/26/2018 10:00 AM Gerda Diss, DO [facility]  04/01/2018  8:00 AM Marin Olp, MD [facility]   Recheck depression within 1-2 months  Lab/Order associations: Need for prophylactic vaccination and inoculation against influenza - Plan: Flu Vaccine QUAD 36+ mos IM  Hypothyroidism, unspecified type - Plan: TSH  Restless legs syndrome - Plan: Iron, TIBC and Ferritin Panel  Acute pain of right shoulder - Plan: Ambulatory referral to Sports Medicine  Major depressive disorder in partial remission, unspecified whether recurrent (HCC)  Primary osteoarthritis of both knees  Meds ordered this encounter  Medications  . gabapentin (NEURONTIN) 300 MG capsule    Sig: Take 3 capsules (900 mg total) by mouth at bedtime.    Dispense:  270 capsule    Refill:  3  . venlafaxine XR (EFFEXOR-XR) 75 MG 24 hr capsule    Sig: Take 1 capsule (75 mg total) by mouth daily with breakfast.    Dispense:  90 capsule    Refill:  3   Return  precautions advised.  Garret Reddish, MD

## 2018-01-26 NOTE — Assessment & Plan Note (Signed)
S: Patient with restless legs.  She is on gabapentin 900 mg at bedtime-reasonable control with this.  She is on a bariatric vitamin as well as additional iron as well Lab Results  Component Value Date   FERRITIN 15 03/31/2017  A/P: Stable- need to get ferritin over 75 if possible.

## 2018-01-26 NOTE — Assessment & Plan Note (Signed)
S: On thyroid medication-now up to 250 mcg total daily-uses 2 of the 125 mg levothyroxine per day Lab Results  Component Value Date   TSH 2.20 03/31/2017  A/P: Hopefully stable-update TSH

## 2018-01-26 NOTE — Patient Instructions (Addendum)
We will go back to 75 mg extended release and recheck in 2 months. She will let us know if any thoughts of self harm with dosage change immediately- if we are not available can call 911  Thanks for doing flu shot today  Ok to increase gabapentin to 900 mg at night- hopefully if we get iron up can go back to 625m  Please stop by lab before you go

## 2018-01-26 NOTE — Progress Notes (Signed)
Kara [name]. [place], [place] at [facility] [id number]  Darrin Apodaca - 42 y.o. female MRN [id number]  Date of birth: 1975/05/03  Visit Date: 01/26/2018  PCP: Marin Olp, MD   Referred by: Marin Olp, MD  SUBJECTIVE:   Chief Complaint  Patient presents with  . New Patient (Initial Visit)    R shoulder pain    HPI: Patient presents with 1 month of right generalized shoulder pain.  Is described as moderate and constant.  It is typically throbbing increasing to sharp on occasion.  Her symptoms have not changed in character since the onset.  She does have increasing symptoms with reaching behind her back and laying supine.  Pain occasionally radiates towards the elbow but not past.  Tylenol and gabapentin have been only minimally helpful.  She is unable to take anti-inflammatories secondary to gastric bypass surgery and she is allergic to aspirin.  REVIEW OF SYSTEMS: Per HPI Otherwise 12 point review of systems performed and is negative   HISTORY:  Prior history reviewed and updated per electronic medical record.  Patient Active Problem List   Diagnosis Date Noted  . 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    Marland Kitchen Restless legs syndrome 04/24/2017    Gabapentin 300--> 900 mg   . 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    Right 05/2015. Had tibial stress fracture 03/2015.    Marland Kitchen Allergic rhinitis 02/03/2014    zyrtec   . Vitamin D deficiency 02/03/2014  . BPPV (benign paroxysmal positional vertigo) 04/19/2013  . Osteoarthritis of both knees 04/19/2013    Tramadol prn 1-2x a day. Willing to refill as long as no increased frequency. May need knee replacement.    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 Carron Brazen now. B12, flinstones MV, calcium. Weight 386 previously down about 200 lbs to 185-190 range. OA knees severe  persists.    . Depression 02/22/2010    effexor 75 mg XR 12/2017 <--effexor 30m BID.  Celexa= weight gain and poor control Depression/anxiety (smaller portion). Effexor 37.520mdaily- trial down from BID due to excellent control.     . DJD (degenerative joint disease) 10/26/2009    Dr. DuSharol Givenadvised right hip and right knee replacement needed   . OBESITY 06/16/2008    Lost >100 lbs with bypass.    . Hyperlipidemia 06/11/2007    No rx. Diet/exercise.     . Marland Kitchenypothyroidism 08/18/2006    Levothyroxine 250 mcg. Has ranged from 200-250.     Social History   Occupational History  . Not on file  Tobacco Use  . Smoking status: Former Smoker    Types: Cigarettes    Last attempt to quit: 01/28/2005    Years since quitting: 13.1  . Smokeless tobacco: Never Used  Substance and Sexual Activity  . Alcohol use: No    Comment: not since gastric bypass  . Drug use: No  . Sexual activity: Not on file   Social History   Social History Narrative   Married (husband transgender female to female). No children. Several pets-1 dog, 4 cats, 2 guDenmarkigs.       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 ANXIETY 08/18/2006  . 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  DEPRESSION 02/22/2010  . GERD (gastroesophageal reflux disease)    history of  . HYPERLIPIDEMIA 06/11/2007  . HYPOTHYROIDISM 08/18/2006  . OBESITY 06/16/2008  . Pneumonia    hx of   . PONV (postoperative nausea and vomiting)    used Scopolamine patch   Past Surgical History:  Procedure Laterality Date  . CHOLECYSTECTOMY N/A 05/08/2012   Procedure: LAPAROSCOPIC CHOLECYSTECTOMY WITH INTRAOPERATIVE CHOLANGIOGRAM;  Surgeon: Leighton Ruff, MD;  Location: [facility];  Service: General;  Laterality: N/A;  . FRACTURE SURGERY    . GASTRIC ROUX-EN-Y  12/29/2012   Procedure: LAPAROSCOPIC ROUX-EN-Y GASTRIC BYPASS WITH UPPER ENDOSCOPY;   Surgeon: Madilyn Hook, DO;  Location: [facility];  Service: General;;  . HARDWARE REMOVAL Right 06/14/2015   Procedure: Removal Internal Fixation Right Lateral Tibial Plateau;  Surgeon: [name], MD;  Location: [place];  Service: Orthopedics;  Laterality: Right;  . HERNIA REPAIR    . KNEE ARTHROSCOPY Right 07/28/12   dr. Sharol Given  . ORIF TIBIA PLATEAU Right 03/10/2015   Procedure: OPEN REDUCTION INTERNAL FIXATION (ORIF) LATERAL TIBIAL PLATEAU;  Surgeon: [name], MD;  Location: [place];  Service: Orthopedics;  Laterality: Right;  . ORIF ULNAR FRACTURE Right 1995   plates and pins  . TOTAL KNEE ARTHROPLASTY Right 06/14/2015  . TOTAL KNEE ARTHROPLASTY Right 06/14/2015   Procedure: RIGHT TOTAL KNEE ARTHROPLASTY;  Surgeon: [name], MD;  Location: [place];  Service: Orthopedics;  Laterality: Right;  . UPPER GI ENDOSCOPY  12/29/2012   Procedure: UPPER GI ENDOSCOPY;  Surgeon: Madilyn Hook, DO;  Location: [facility];  Service: General;;   family history includes Breast cancer (age of onset: 42) in her mother; Cancer in her mother; Heart disease (age of onset: 105) in her father; Schizophrenia in her mother; Uterine cancer in her mother. Recent Labs    03/31/17 0819 01/26/18 0955  CREATININE 0.74  --   CALCIUM 9.3  --   AST 15  --   ALT 15  --   TSH 2.20 <0.01*    OBJECTIVE:  VS:  HT:5' 6"  (167.6 cm)   WT:286 lb (129.7 kg)  BMI:46.18    BP:114/88  HR:82bpm  TEMP: ( )  RESP:98 %   PHYSICAL EXAM: Adult female. No acute distress.  Alert and appropriate. EYES: Pupils are equal., EOM intact without nystagmus. and No scleral icterus. Psychiatric: Alert & appropriately interactive. and Not depressed or anxious appearing. EXTREMITY EXAM: Warm and well perfused   Right Shoulder Exam Well aligned, no significant deformity. No overlying skin changes. Non tender to palpation over: Bony Landmarks TTP over: Anterior shoulder Axial loading produces: No pain and No crepitation Drop arm test:  negative  Internal Rotation: ROM: Sacrum with mild pain.   Strength: Normal External Rotation: ROM: Normal with no pain.   Strength: Normal  Hawkins: positive, moderate pain Neers: positive, moderate pain  Empty Can: normal, no pain Strength: Normal Speed's: normal, no pain Strength: Normal O'Briens: normal, no pain Strength: Normal  Scapular movement a discoordinated fashion.    ASSESSMENT:   1. Acute pain of right shoulder   2. Subacromial bursitis of right shoulder joint   3. Scapular dyskinesis   4. Rotator cuff syndrome of right shoulder     PROCEDURES:  US Guided Injection per procedure note  Home Therapeutic exercises prescribed per procedure note.      PLAN:  Pertinent additional documentation may be included in corresponding procedure notes, imaging studies, problem based documentation and patient instructions.  Fairly classic rotator cuff syndrome with scapular dyskinesis.  There was a bursa appreciated on MSK ultrasound this was directly injected today.  Home Therapeutic exercises prescribed today per procedure note.  TENDINOPATHY - Discussed that the anticipated amount of time for healing is 12- 24 weeks for Tendinopathic changes.  Emphasized the importance of improving blood flow as well as eccentric loading of the tendon.  Activity modifications and the importance of avoiding exacerbating activities (limiting pain to no more than a 4 / 10 during or following activity) recommended and discussed.  Discussed red flag symptoms that warrant earlier emergent evaluation and patient voices understanding.   At follow up will plan to consider: repeat MSK Ultrasound and Can consider nitroglycerin protocol.  Return in about 4 weeks (around 02/23/2018).          Gerda Diss, [place] [facility] Physician

## 2018-01-26 NOTE — Patient Instructions (Addendum)

## 2018-01-27 LAB — IRON,TIBC AND FERRITIN PANEL
%SAT: 26 % (calc) (ref 16–45)
FERRITIN: 24 ng/mL (ref 16–232)
Iron: 98 ug/dL (ref 40–190)
TIBC: 372 mcg/dL (calc) (ref 250–450)

## 2018-02-05 ENCOUNTER — Other Ambulatory Visit: Payer: Self-pay

## 2018-02-05 DIAGNOSIS — E039 Hypothyroidism, unspecified: Secondary | ICD-10-CM

## 2018-02-25 DIAGNOSIS — Z3042 Encounter for surveillance of injectable contraceptive: Secondary | ICD-10-CM | POA: Diagnosis not present

## 2018-02-27 ENCOUNTER — Encounter: Payer: Self-pay | Admitting: Sports Medicine

## 2018-02-27 ENCOUNTER — Ambulatory Visit (INDEPENDENT_AMBULATORY_CARE_PROVIDER_SITE_OTHER): Payer: BLUE CROSS/BLUE SHIELD | Admitting: Sports Medicine

## 2018-02-27 VITALS — BP 110/82 | HR 80 | Ht 66.0 in | Wt 288.6 lb

## 2018-02-27 DIAGNOSIS — M170 Bilateral primary osteoarthritis of knee: Secondary | ICD-10-CM

## 2018-02-27 DIAGNOSIS — M75101 Unspecified rotator cuff tear or rupture of right shoulder, not specified as traumatic: Secondary | ICD-10-CM

## 2018-02-27 DIAGNOSIS — Z96651 Presence of right artificial knee joint: Secondary | ICD-10-CM

## 2018-02-27 DIAGNOSIS — M25511 Pain in right shoulder: Secondary | ICD-10-CM | POA: Diagnosis not present

## 2018-02-27 DIAGNOSIS — Z9884 Bariatric surgery status: Secondary | ICD-10-CM

## 2018-02-27 DIAGNOSIS — M7551 Bursitis of right shoulder: Secondary | ICD-10-CM

## 2018-02-27 DIAGNOSIS — G2589 Other specified extrapyramidal and movement disorders: Secondary | ICD-10-CM | POA: Diagnosis not present

## 2018-02-27 NOTE — Progress Notes (Signed)
Kara [name]. Kara [name], [place] at [place]  Kara [name] - 43 y.o. female MRN [id number]  Date of birth: 05/03/1975  Visit Date: February 27, 2018  PCP: Marin Olp, MD   Referred by: Marin Olp, MD  SUBJECTIVE:  Chief Complaint  Patient presents with  . Right Shoulder - Follow-up    Corticosteroid inj 01/26/2018. Provided with HEP - intrinsic RC and scapular stabilization. No NSAID's d/t gastric bypass. Tylenol prn.   . Left Knee - Follow-up    HPI: Patient is here for 4-week follow-up after right rotator subacromial bursa injection.  She reports feeling significant time.  As long as she avoids external rotation and abduction did positions as well as full overhead reaching positions she does well with this.  She has only a small amount of twinging first thing in the morning.  She has been performing her home therapeutic exercises on an almost daily basis.  She also has questions regarding her left knee which she has asked to send.  She is interested in other longer term injection options.  She is currently getting about 1 shot per year from Dr. Cherylynn Ridges at [place].  REVIEW OF SYSTEMS: Per HPI    HISTORY:  Prior history reviewed and updated per electronic medical record.  Social History   Occupational History  . Not on file  Tobacco Use  . Smoking status: Former Smoker    Types: Cigarettes    Last attempt to quit: 01/28/2005    Years since quitting: 13.0  . Smokeless tobacco: Never Used  Substance and Sexual Activity  . Alcohol use: No    Comment: not since gastric bypass  . Drug use: No  . Sexual activity: Not on file   Social History   Social History Narrative   Married (husband transgender female to female). No children. Several pets-1 dog, 4 cats, 2 [place] pigs.       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 No specialty comments available. 01/31/2017: X-rays right knee: Left knee with bone-on-bone contact of the medial joint line with periarticular bone spurs.  OBJECTIVE:  VS:  HT:5' 6"  (167.6 cm)   WT:288 lb 9.6 oz (130.9 kg)  BMI:46.6    BP:110/82  HR:80bpm  TEMP: ( )  RESP:100 %   PHYSICAL EXAM:  Adult female.  No acute distress.  Alert and appropriate.  Right shoulder is well aligned.  She has improved carriage.  Full overhead range of motion as well as normal internal and external.  Her strength is 5/5 with internal rotation, external rotation, abduction and, empty can testing, speeds testing and O'Brien's testing.  She has only minimal pain with these motions.  No pain with axial load and circumduction.  She walks with an antalgic gait.   ASSESSMENT   1. Acute pain of right shoulder   2. Subacromial bursitis of right shoulder joint   3. Scapular dyskinesis   4. Rotator cuff syndrome of right shoulder   5. Gastric bypass status for obesity   6. Status post total right knee replacement   7. Primary osteoarthritis of both knees  PROCEDURES:  None  PLAN:  Pertinent additional documentation may be included in corresponding procedure notes, imaging studies, problem based documentation and patient instructions.  No problem-specific Assessment & Plan notes found for this encounter. Right shoulder is doing remarkably better.  We will have her continue with home therapeutic exercises to be performed on a 4-5 times per week basis.  For her left knee we discussed options including hyaluronic acid injections and a longer acting steroid injection/(Zilretta).  Ultimately we will get her preapproved for Zilretta injections if she is interested she will follow-up with [name] as needed for either the above issues. Continue  previously prescribed home exercise program.  Discussed appropriate use of both heat and ice with the patient today.    RICE (Rest, ICE, Compression, Elevation) principles reviewed with the patient.   Activity modifications and the importance of avoiding exacerbating activities (limiting pain to no more than a 4 / 10 during or following activity) recommended and discussed.   Discussed red flag symptoms that warrant earlier emergent evaluation and patient voices understanding.   >50% of this 25 minutes minute visit spent in direct patient counseling and/or coordination of care. Discussion was focused on education regarding the in discussing the pathoetiology and anticipated clinical course of the above condition.    No orders of the defined types were placed in this encounter. Lab Orders  No laboratory test(s) ordered today   Imaging Orders  No imaging studies ordered today   Referral Orders  No referral(s) requested today     Return if symptoms worsen or fail to improve.          Gerda Diss, [place] [facility] Physician

## 2018-03-07 ENCOUNTER — Encounter: Payer: Self-pay | Admitting: Sports Medicine

## 2018-03-08 ENCOUNTER — Other Ambulatory Visit: Payer: Self-pay | Admitting: Family Medicine

## 2018-03-10 ENCOUNTER — Encounter: Payer: Self-pay | Admitting: Family Medicine

## 2018-03-10 MED ORDER — LEVOTHYROXINE SODIUM 125 MCG PO TABS: 250.0000 ug | ORAL_TABLET | Freq: Every day | ORAL | 0 refills | Status: DC

## 2018-03-18 ENCOUNTER — Other Ambulatory Visit (INDEPENDENT_AMBULATORY_CARE_PROVIDER_SITE_OTHER): Payer: BLUE CROSS/BLUE SHIELD

## 2018-03-18 ENCOUNTER — Other Ambulatory Visit: Payer: BLUE CROSS/BLUE SHIELD

## 2018-03-18 DIAGNOSIS — E039 Hypothyroidism, unspecified: Secondary | ICD-10-CM

## 2018-03-18 LAB — TSH: TSH: <0.01 u[IU]/mL — ABNORMAL LOW (ref 0.35–4.50)

## 2018-03-20 NOTE — Addendum Note (Signed)
Addended by: Gwenyth Ober R on: 03/20/2018 05:52 PM   Modules accepted: Orders

## 2018-03-29 ENCOUNTER — Encounter: Payer: Self-pay | Admitting: Family Medicine

## 2018-03-30 NOTE — Telephone Encounter (Signed)
Please see message. °

## 2018-04-01 ENCOUNTER — Ambulatory Visit: Payer: BLUE CROSS/BLUE SHIELD | Admitting: Family Medicine

## 2018-04-06 ENCOUNTER — Other Ambulatory Visit: Payer: Self-pay | Admitting: Family Medicine

## 2018-04-06 NOTE — Telephone Encounter (Signed)
Dr. Yong Channel, Pt need a refill on Ultram 50MG.  Pt has an appointment on 04/24/18.  Thank you.

## 2018-04-21 ENCOUNTER — Encounter: Payer: Self-pay | Admitting: Family Medicine

## 2018-04-23 NOTE — Patient Instructions (Addendum)
Video visit

## 2018-04-24 ENCOUNTER — Encounter: Payer: Self-pay | Admitting: Family Medicine

## 2018-04-24 ENCOUNTER — Ambulatory Visit (INDEPENDENT_AMBULATORY_CARE_PROVIDER_SITE_OTHER): Payer: BLUE CROSS/BLUE SHIELD | Admitting: Family Medicine

## 2018-04-24 VITALS — Ht 66.0 in

## 2018-04-24 DIAGNOSIS — E039 Hypothyroidism, unspecified: Secondary | ICD-10-CM | POA: Diagnosis not present

## 2018-04-24 DIAGNOSIS — F324 Major depressive disorder, single episode, in partial remission: Secondary | ICD-10-CM | POA: Diagnosis not present

## 2018-04-24 DIAGNOSIS — G2581 Restless legs syndrome: Secondary | ICD-10-CM

## 2018-04-24 NOTE — Progress Notes (Signed)
Phone [phone]   Subjective:  Virtual visit via Video note  Our team/I connected with Kara [name] on 04/24/18 at  8:00 AM EDT by phone (patient did not have equipment for webex) and verified that I am speaking with the correct person using two identifiers.  Location patient: [facility] Location provider: [facility], office Persons participating in the virtual visit:  patient  Our team/I discussed the limitations of evaluation and management by telemedicine and the availability of in person appointments. In light of current covid-19 pandemic, patient also understands that we are trying to protect them by minimizing in office contact if at all possible.  The patient expressed consent for telemedicine visit and agreed to proceed.   ROS- No chest pain or shortness of breath. No headache or blurry vision. No fever or cough   Past Medical History-  Patient Active Problem List   Diagnosis Date Noted  . 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 Restless legs syndrome 04/24/2017    Priority: Medium  . Depression 02/22/2010    Priority: Medium  . OBESITY 06/16/2008    Priority: Medium  . Hyperlipidemia 06/11/2007    Priority: Medium  . Hypothyroidism 08/18/2006    Priority: Medium  . Rotator cuff syndrome of right shoulder 02/27/2018    Priority: Low  . Allergic rhinitis 02/03/2014    Priority: Low  . Vitamin D deficiency 02/03/2014    Priority: Low  . BPPV (benign paroxysmal positional vertigo) 04/19/2013    Priority: Low  . Osteoarthritis of both knees 04/19/2013    Priority: Low  . DJD (degenerative joint disease) 10/26/2009    Priority: Low    Medications- reviewed and updated Current Outpatient Medications  Medication Sig Dispense Refill  . acetaminophen (TYLENOL) 325 MG tablet Take 650 mg by mouth 3 (three) times daily.     . calcium carbonate (OS-CAL - DOSED IN MG OF ELEMENTAL  CALCIUM) 1250 (500 Ca) MG tablet Take 1 tablet by mouth 3 (three) times daily with meals.    Marland Kitchen Carbonyl Iron (IRON CHEWS PEDIATRIC PO) Take 1 tablet by mouth daily.    Marland Kitchen gabapentin (NEURONTIN) 300 MG capsule Take 3 capsules (900 mg total) by mouth at bedtime. 270 capsule 3  . glucosamine-chondroitin 500-400 MG tablet Take 1 tablet by mouth daily.    Marland Kitchen levocetirizine (XYZAL ALLERGY 24HR) 5 MG tablet Take 5 mg by mouth every evening.    Marland Kitchen levothyroxine (SYNTHROID, LEVOTHROID) 125 MCG tablet Take 2 tablets (250 mcg total) by mouth daily. 180 tablet 0  . medroxyPROGESTERone (DEPO-PROVERA) 150 MG/ML injection Inject 150 mg into the muscle every 3 (three) months. Last dose was 12/10/12    . Menthol (BIOFREEZE) 10.5 % AERO Apply topically.    . Multiple Vitamins-Minerals (BARIATRIC MULTIVITAMINS/IRON PO) Take 1 tablet by mouth 2 (two) times daily.    . traMADol (ULTRAM) 50 MG tablet TAKE 1 TABLET(50 MG) BY MOUTH THREE TIMES DAILY AS NEEDED 90 tablet 1  . venlafaxine XR (EFFEXOR-XR) 75 MG 24 hr capsule Take 1 capsule (75 mg total) by mouth daily with breakfast. 90 capsule 3  . vitamin B-12 (CYANOCOBALAMIN) 500 MCG tablet Take 500 mcg by mouth daily.     No current facility-administered medications for this visit.      Objective:  Ht 5' 6"  (1.676 m)   BMI 46.58 kg/m  Gen: NAD, resting comfortably Lungs: nonlabored, normal respiratory  rate  Skin: warm, dry, no obvious rash    Assessment and Plan   #hypothyroidism S: On thyroid medication- compliant with levothyroxine 250 mcg daily everyday but Saturday and on Sunday only take 125 mcg Lab Results  Component Value Date   TSH <0.01 (L) 03/18/2018   ROS-No hair or nail changes. No heat/cold intolerance. No constipation or diarrhea. Denies shakiness or anxiety.  A/P: appointment on Wednesday for labs only. Hopefully improved  # Depression S: mild poorly controlled on venlafaxine 75 mg XR. A lot of stress with covid 19 Depression screen PHQ 2/9  04/24/2018  Decreased Interest 2  Down, Depressed, Hopeless 2  PHQ - 2 Score 4  Altered sleeping 0  Tired, decreased energy 3  Change in appetite 0  Feeling bad or failure about yourself  0  Trouble concentrating 0  Moving slowly or fidgety/restless 0  Suicidal thoughts 0  PHQ-9 Score 7  Difficult doing work/chores Somewhat difficult  A/P: mild poor control. We gave her # or [place] [facility]- she wants to hold off on adding medication at this point  # restless legs syndrome S: taking iron 65 mg. Had run out and things worsened. Now able to go back to 2 gabapentin per day A/P: Stable. Continue current medications. 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 Spider bite or scratch on hand- states "100% better" but will call if fails to continue to improve   Future Appointments  Date Time Provider [place]  04/29/2018  8:15 AM [facility]   Return in about 6 months (around 10/25/2018) for physical.  Lab/Order associations: Hypothyroidism, unspecified type  Restless legs syndrome  Major depressive disorder in partial remission, unspecified whether recurrent ([place])  Return precautions advised.  Garret Reddish, MD

## 2018-04-29 ENCOUNTER — Other Ambulatory Visit: Payer: Self-pay

## 2018-04-29 ENCOUNTER — Other Ambulatory Visit (INDEPENDENT_AMBULATORY_CARE_PROVIDER_SITE_OTHER): Payer: BLUE CROSS/BLUE SHIELD

## 2018-04-29 DIAGNOSIS — E039 Hypothyroidism, unspecified: Secondary | ICD-10-CM | POA: Diagnosis not present

## 2018-04-29 LAB — TSH: TSH: 0.03 u[IU]/mL — ABNORMAL LOW (ref 0.35–4.50)

## 2018-05-06 ENCOUNTER — Encounter: Payer: Self-pay | Admitting: Family Medicine

## 2018-05-18 DIAGNOSIS — Z6841 Body Mass Index (BMI) 40.0 and over, adult: Secondary | ICD-10-CM | POA: Diagnosis not present

## 2018-05-18 DIAGNOSIS — Z01419 Encounter for gynecological examination (general) (routine) without abnormal findings: Secondary | ICD-10-CM | POA: Diagnosis not present

## 2018-05-18 DIAGNOSIS — Z3042 Encounter for surveillance of injectable contraceptive: Secondary | ICD-10-CM | POA: Diagnosis not present

## 2018-05-18 DIAGNOSIS — N390 Urinary tract infection, site not specified: Secondary | ICD-10-CM | POA: Diagnosis not present

## 2018-05-18 DIAGNOSIS — Z1231 Encounter for screening mammogram for malignant neoplasm of breast: Secondary | ICD-10-CM | POA: Diagnosis not present

## 2018-05-18 LAB — HM MAMMOGRAPHY

## 2018-05-18 LAB — HM PAP SMEAR: HM Pap smear: NEGATIVE

## 2018-06-10 ENCOUNTER — Other Ambulatory Visit: Payer: Self-pay

## 2018-06-10 ENCOUNTER — Other Ambulatory Visit (INDEPENDENT_AMBULATORY_CARE_PROVIDER_SITE_OTHER): Payer: BLUE CROSS/BLUE SHIELD

## 2018-06-10 ENCOUNTER — Encounter: Payer: Self-pay | Admitting: Family Medicine

## 2018-06-10 DIAGNOSIS — E039 Hypothyroidism, unspecified: Secondary | ICD-10-CM

## 2018-06-10 LAB — TSH: TSH: 0.03 u[IU]/mL — ABNORMAL LOW (ref 0.35–4.50)

## 2018-06-10 NOTE — Addendum Note (Signed)
Addended by: Francis Dowse T on: 06/10/2018 08:12 AM   Modules accepted: Orders

## 2018-06-18 ENCOUNTER — Encounter: Payer: Self-pay | Admitting: Family Medicine

## 2018-06-18 ENCOUNTER — Ambulatory Visit (INDEPENDENT_AMBULATORY_CARE_PROVIDER_SITE_OTHER): Payer: BLUE CROSS/BLUE SHIELD | Admitting: Physician Assistant

## 2018-06-18 ENCOUNTER — Encounter: Payer: Self-pay | Admitting: Physician Assistant

## 2018-06-18 VITALS — Temp 97.9°F | Ht 66.0 in | Wt 288.0 lb

## 2018-06-18 DIAGNOSIS — R42 Dizziness and giddiness: Secondary | ICD-10-CM

## 2018-06-18 MED ORDER — DIAZEPAM 5 MG PO TABS: 5.0000 mg | ORAL_TABLET | Freq: Two times a day (BID) | ORAL | 0 refills | Status: DC | PRN

## 2018-06-18 MED ORDER — ONDANSETRON 4 MG PO TBDP: 4.0000 mg | ORAL_TABLET | Freq: Three times a day (TID) | ORAL | 0 refills | Status: DC | PRN

## 2018-06-18 NOTE — Progress Notes (Signed)
Virtual Visit via Video   I connected with Kara [name] on 06/18/18 at  8:20 AM EDT by a video enabled telemedicine application and verified that I am speaking with the correct person using two identifiers. Location patient: Home Location provider: [facility], Office Persons participating in the virtual visit: Moneisha Vosler, Inda Coke PA-C, Anselmo Pickler, LPN   I discussed the limitations of evaluation and management by telemedicine and the availability of in person appointments. The patient expressed understanding and agreed to proceed.  I acted as a Education administrator for [employer], PA-C [employer], LPN  Subjective:   HPI:  Dizziness Pt c/o dizziness and nausea since yesterday evening around 7:00PM. She states that prior to onset a few hours earlier she was having dental work performed and her head was moved from side to side several times, thinks that this triggered her episode. Pt vomited last night. Pt has not taken any medications. Pt is drinking liquids and had toast this morning. When pt gets up room is spinning.  Has had this in the past. Valium worked well for her, she is requesting this.  Denies: thunderclap HA, weakness on one side of the body, slurred speech, falls, confusion, head trauma   ROS: See pertinent positives and negatives per HPI.  Patient Active Problem List   Diagnosis Date Noted  . Rotator cuff syndrome of right shoulder 02/27/2018  . Restless legs syndrome 04/24/2017  . 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 Allergic rhinitis 02/03/2014  . Vitamin D deficiency 02/03/2014  . BPPV (benign paroxysmal positional vertigo) 04/19/2013  . Osteoarthritis of both knees 04/19/2013  . 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 Depression 02/22/2010  . DJD (degenerative joint disease) 10/26/2009  . OBESITY 06/16/2008  . Hyperlipidemia 06/11/2007  . Hypothyroidism 08/18/2006    Social History   Tobacco Use  . Smoking status: Former Smoker     Types: Cigarettes    Last attempt to quit: 01/28/2005    Years since quitting: 13.3  . Smokeless tobacco: Never Used  Substance Use Topics  . Alcohol use: No    Comment: not since gastric bypass    Current Outpatient Medications:  .  acetaminophen (TYLENOL) 325 MG tablet, Take 650 mg by mouth 3 (three) times daily. , Disp: , Rfl:  .  calcium carbonate (OS-CAL - DOSED IN MG OF ELEMENTAL CALCIUM) 1250 (500 Ca) MG tablet, Take 1 tablet by mouth 3 (three) times daily with meals., Disp: , Rfl:  .  Carbonyl Iron (IRON CHEWS PEDIATRIC PO), Take 1 tablet by mouth daily., Disp: , Rfl:  .  gabapentin (NEURONTIN) 300 MG capsule, Take 3 capsules (900 mg total) by mouth at bedtime. (Patient taking differently: Take 600 mg by mouth at bedtime. ), Disp: 270 capsule, Rfl: 3 .  glucosamine-chondroitin 500-400 MG tablet, Take 1 tablet by mouth daily., Disp: , Rfl:  .  levocetirizine (XYZAL ALLERGY 24HR) 5 MG tablet, Take 5 mg by mouth every evening., Disp: , Rfl:  .  levothyroxine (SYNTHROID, LEVOTHROID) 125 MCG tablet, Take 2 tablets (250 mcg total) by mouth daily. (Patient taking differently: Take 250 mcg by mouth daily. Take 2 tablets  Mon, Wed & Fri and 1 tablet 125 mcg Tues, Thurs & Sun.), Disp: 180 tablet, Rfl: 0 .  medroxyPROGESTERone (DEPO-PROVERA) 150 MG/ML injection, Inject 150 mg into the muscle every 3 (three) months. Last dose was 12/10/12, Disp: , Rfl:  .  Multiple Vitamins-Minerals (BARIATRIC MULTIVITAMINS/IRON PO), Take 1 tablet by mouth  2 (two) times daily., Disp: , Rfl:  .  traMADol (ULTRAM) 50 MG tablet, TAKE 1 TABLET(50 MG) BY MOUTH THREE TIMES DAILY AS NEEDED, Disp: 90 tablet, Rfl: 1 .  venlafaxine XR (EFFEXOR-XR) 75 MG 24 hr capsule, Take 1 capsule (75 mg total) by mouth daily with breakfast., Disp: 90 capsule, Rfl: 3 .  vitamin B-12 (CYANOCOBALAMIN) 500 MCG tablet, Take 500 mcg by mouth daily., Disp: , Rfl:  .  diazepam (VALIUM) 5 MG tablet, Take 1 tablet (5 mg total) by mouth every 12  (twelve) hours as needed (dizziness)., Disp: 20 tablet, Rfl: 0 .  ondansetron (ZOFRAN ODT) 4 MG disintegrating tablet, Take 1 tablet (4 mg total) by mouth every 8 (eight) hours as needed for nausea or vomiting., Disp: 20 tablet, Rfl: 0  Allergies  Allergen Reactions  . Asa [Aspirin] Itching and Swelling    Objective:   VITALS: Per patient if applicable, see vitals. GENERAL: Alert, appears well and in no acute distress. HEENT: Atraumatic, conjunctiva clear, no obvious abnormalities on inspection of external nose and ears. NECK: Normal movements of the head and neck. CARDIOPULMONARY: No increased WOB. Speaking in clear sentences. I:E ratio WNL.  MS: Moves all visible extremities without noticeable abnormality. PSYCH: Pleasant and cooperative, well-groomed. Speech normal rate and rhythm. Affect is appropriate. Insight and judgement are appropriate. Attention is focused, linear, and appropriate.  NEURO: CN grossly intact. Oriented as arrived to appointment on time with no prompting. Moves both UE equally.  SKIN: No obvious lesions, wounds, erythema, or cyanosis noted on face or hands.  Assessment and Plan:   Donielle was seen today for dizziness.  Diagnoses and all orders for this visit:  Dizziness Suspect recurrent of her BPPV recently triggered by position changes during dental procedure. No red flags on discussion with her. Valium and zofran provided. Epley maneuvers sent to MyChart. Work-note provided. Worsening precautions advised, may need vestibular rehab if no improvement of symptoms.  Other orders -     diazepam (VALIUM) 5 MG tablet; Take 1 tablet (5 mg total) by mouth every 12 (twelve) hours as needed (dizziness). -     ondansetron (ZOFRAN ODT) 4 MG disintegrating tablet; Take 1 tablet (4 mg total) by mouth every 8 (eight) hours as needed for nausea or vomiting.    . Reviewed expectations re: course of current medical issues. . Discussed self-management of symptoms. .  Outlined signs and symptoms indicating need for more acute intervention. . Patient verbalized understanding and all questions were answered. Marland Kitchen Health Maintenance issues including appropriate healthy diet, exercise, and smoking avoidance were discussed with patient. . See orders for this visit as documented in the electronic medical record.  I discussed the assessment and treatment plan with the patient. The patient was provided an opportunity to ask questions and all were answered. The patient agreed with the plan and demonstrated an understanding of the instructions.   The patient was advised to call back or seek an in-person evaluation if the symptoms worsen or if the condition fails to improve as anticipated.   CMA or LPN served as scribe during this visit. History, Physical, and Plan performed by medical provider. The above documentation has been reviewed and is accurate and complete.  [place], [place] 06/18/2018

## 2018-06-18 NOTE — Telephone Encounter (Signed)
Called patient hunter had no openings put with you today

## 2018-06-22 ENCOUNTER — Other Ambulatory Visit: Payer: Self-pay | Admitting: Family Medicine

## 2018-06-22 ENCOUNTER — Encounter: Payer: Self-pay | Admitting: Family Medicine

## 2018-06-23 ENCOUNTER — Encounter: Payer: Self-pay | Admitting: Family Medicine

## 2018-06-23 MED ORDER — GABAPENTIN 300 MG PO CAPS: 600.0000 mg | ORAL_CAPSULE | Freq: Every day | ORAL | 3 refills | Status: DC

## 2018-06-23 MED ORDER — TRAMADOL HCL 50 MG PO TABS: ORAL_TABLET | ORAL | 5 refills | Status: DC

## 2018-06-24 ENCOUNTER — Other Ambulatory Visit: Payer: Self-pay

## 2018-06-24 DIAGNOSIS — E039 Hypothyroidism, unspecified: Secondary | ICD-10-CM

## 2018-06-24 NOTE — Telephone Encounter (Signed)
Labs placed for future and patient is aware to recheck lab in 6 weeks.

## 2018-07-22 ENCOUNTER — Other Ambulatory Visit: Payer: Self-pay

## 2018-07-22 ENCOUNTER — Other Ambulatory Visit (INDEPENDENT_AMBULATORY_CARE_PROVIDER_SITE_OTHER): Payer: BC Managed Care – PPO

## 2018-07-22 DIAGNOSIS — E039 Hypothyroidism, unspecified: Secondary | ICD-10-CM

## 2018-07-22 LAB — TSH: TSH: 0.12 u[IU]/mL — ABNORMAL LOW (ref 0.35–4.50)

## 2018-08-03 DIAGNOSIS — Z3042 Encounter for surveillance of injectable contraceptive: Secondary | ICD-10-CM | POA: Diagnosis not present

## 2018-08-04 ENCOUNTER — Encounter: Payer: Self-pay | Admitting: Family Medicine

## 2018-08-04 DIAGNOSIS — E039 Hypothyroidism, unspecified: Secondary | ICD-10-CM

## 2018-09-07 ENCOUNTER — Other Ambulatory Visit: Payer: Self-pay

## 2018-09-07 ENCOUNTER — Other Ambulatory Visit (INDEPENDENT_AMBULATORY_CARE_PROVIDER_SITE_OTHER): Payer: Self-pay

## 2018-09-07 DIAGNOSIS — E039 Hypothyroidism, unspecified: Secondary | ICD-10-CM

## 2018-09-07 LAB — TSH: TSH: 0.71 u[IU]/mL (ref 0.35–4.50)

## 2018-10-19 DIAGNOSIS — Z3042 Encounter for surveillance of injectable contraceptive: Secondary | ICD-10-CM | POA: Diagnosis not present

## 2018-10-26 NOTE — Progress Notes (Signed)
Phone [phone]   Subjective:  Kara [name] is a 43 y.o. year old very pleasant female patient who presents for/with See problem oriented charting Chief Complaint  Patient presents with  . Follow Up  . Hypothyroidism  . Depression   ROS- depression, anhedonia, poor sleep, fatigue.    Past Medical History-  Patient Active Problem List   Diagnosis Date Noted  . 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 Restless legs syndrome 04/24/2017    Priority: Medium  . Depression 02/22/2010    Priority: Medium  . OBESITY 06/16/2008    Priority: Medium  . Hyperlipidemia 06/11/2007    Priority: Medium  . Hypothyroidism 08/18/2006    Priority: Medium  . Rotator cuff syndrome of right shoulder 02/27/2018    Priority: Low  . Allergic rhinitis 02/03/2014    Priority: Low  . Vitamin D deficiency 02/03/2014    Priority: Low  . BPPV (benign paroxysmal positional vertigo) 04/19/2013    Priority: Low  . Osteoarthritis of both knees 04/19/2013    Priority: Low  . DJD (degenerative joint disease) 10/26/2009    Priority: Low    Medications- reviewed and updated Current Outpatient Medications  Medication Sig Dispense Refill  . acetaminophen (TYLENOL) 325 MG tablet Take 650 mg by mouth 3 (three) times daily.     . calcium carbonate (OS-CAL - DOSED IN MG OF ELEMENTAL CALCIUM) 1250 (500 Ca) MG tablet Take 1 tablet by mouth 3 (three) times daily with meals.    Marland Kitchen Carbonyl Iron (IRON CHEWS PEDIATRIC PO) Take 1 tablet by mouth daily.    . diazepam (VALIUM) 5 MG tablet Take 1 tablet (5 mg total) by mouth every 12 (twelve) hours as needed (dizziness). 20 tablet 0  . gabapentin (NEURONTIN) 300 MG capsule Take 2 capsules (600 mg total) by mouth at bedtime. 180 capsule 3  . glucosamine-chondroitin 500-400 MG tablet Take 1 tablet by mouth daily.    Marland Kitchen levocetirizine (XYZAL ALLERGY 24HR) 5 MG tablet Take 5 mg by mouth  every evening.    Marland Kitchen levothyroxine (SYNTHROID) 125 MCG tablet Take 2 tablets (250 mcg total) by mouth daily. Take 2 tablets  Mon, Wed & Fri and 1 tablet 125 mcg Tues, Thurs & Sun. 180 tablet 0  . medroxyPROGESTERone (DEPO-PROVERA) 150 MG/ML injection Inject 150 mg into the muscle every 3 (three) months. Last dose was 12/10/12    . Multiple Vitamins-Minerals (BARIATRIC MULTIVITAMINS/IRON PO) Take 1 tablet by mouth 2 (two) times daily.    . ondansetron (ZOFRAN ODT) 4 MG disintegrating tablet Take 1 tablet (4 mg total) by mouth every 8 (eight) hours as needed for nausea or vomiting. 20 tablet 0  . traMADol (ULTRAM) 50 MG tablet TAKE 1 TABLET(50 MG) BY MOUTH THREE TIMES DAILY AS NEEDED 90 tablet 5  . venlafaxine XR (EFFEXOR-XR) 75 MG 24 hr capsule Take 1 capsule (75 mg total) by mouth daily with breakfast. 90 capsule 3  . vitamin B-12 (CYANOCOBALAMIN) 500 MCG tablet Take 500 mcg by mouth daily.       Objective:  BP (!) 134/94   Pulse 78   Temp 98.3 F (36.8 C)   Ht 5' 6"  (1.676 m)   Wt 297 lb 12.8 oz (135.1 kg)   SpO2 99%   BMI 48.07 kg/m  Gen: NAD, resting comfortably CV: RRR no murmurs rubs or gallops Lungs: CTAB no crackles, wheeze, rhonchi Abdomen:  soft/nontender/nondistended/normal bowel sounds. No rebound or guarding.  Ext: no edema Skin: warm, dry    Assessment and Plan   #hypothyroidism S: On thyroid medication- Synthroid 16m-takes 2 tablets Monday Wednesday and Friday and 1 tablet the rest of the week Lab Results  Component Value Date   TSH 0.71 09/07/2018   A/P: Hopefully stable. Continue current medications-with worsening depression will update TSH.    # Depression S: poorly controlled on Effexor 767m.  Mild poor control last visit due to COVID-19 pandemic  A lot of tension at work- people are being very rude/unkind. 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   Would be interested in counseling but wants to do in  person which is harder with covid 19. She is also discouraged by weight gain.  [facility] 10/28/2018 04/24/2018 01/26/2018  Decreased Interest 2 2 1   Down, Depressed, Hopeless 2 2 1   PHQ - 2 Score 4 4 2   Altered sleeping 3 0 2  Tired, decreased energy 2 3 1   Change in appetite 3 0 1  Feeling bad or failure about yourself  2 0 2  Trouble concentrating 2 0 1  Moving slowly or fidgety/restless 0 0 0  Suicidal thoughts 0 0 0  PHQ-9 Score 16 7 9   Difficult doing work/chores - Somewhat difficult Somewhat difficult  A/P: Poor control today- we will increase effexor. She is interested in potential add on therapy if this does not work instead of going to max dose effexor- we will do 6 week follow up to discuss.  - she will research options for in person therapy - can potentially see Kara [name]#Restless leg syndrome S: Patient was having worsening restless legs before last visit-seem to improve when restarted her iron.  She is currently taking 2 gabapentin per day. Lab Results  Component Value Date   FERRITIN 24 01/26/2018  A/P: doing reasonably well- update ferritin level   #elevated BP S: high today but traditionally not high BP Readings from Last 3 Encounters:  10/28/18 (!) 134/94  02/27/18 110/82  01/26/18 114/88  A/P: first elevation today- will recheck in 6 weeks.   Recommended follow up: We also need to do your physical- might be worth going ahead and scheduling the next available sometime after the 6-week follow-up. I am updating most physical labs other than extensive backslash labs  Lab/Order associations:   ICD-10-CM   1. Hypothyroidism, unspecified type  E03.9 CBC    TSH  2. Restless legs syndrome  G25.81 IBC + Ferritin  3. Major depressive disorder in partial remission, unspecified whether recurrent (HCC)  F32.4 TSH    Vitamin B12    VITAMIN D 25 Hydroxy (Vit-D Deficiency, Fractures)  4. Hyperlipidemia, unspecified hyperlipidemia type  E78.5 CBC     Comprehensive metabolic panel    Lipid panel  5. Vitamin D deficiency  E55.9 VITAMIN D 25 Hydroxy (Vit-D Deficiency, Fractures)  6. Kara Fieldingastric Bypass Dec 2014  Z98.84 Vitamin B12    VITAMIN D 25 Hydroxy (Vit-D Deficiency, Fractures)   Meds ordered this encounter  Medications  . venlafaxine XR (EFFEXOR-XR) 150 MG 24 hr capsule    Sig: Take 1 capsule (150 mg total) by mouth daily with breakfast.    Dispense:  90 capsule    Refill:  3    Return precautions advised.  StGarret ReddishMD

## 2018-10-26 NOTE — Patient Instructions (Addendum)
Health Maintenance Due  Topic Date Due  . INFLUENZA VACCINE -today 08/29/2018   Increase venlafaxine to 150 mg.  Follow-up in 6 weeks.  If you have worsening depression or any thoughts of self-harm-please contact us immediately  Kara [name] is an option that I believe he is doing some in person visits. [email or web address]  We also need to do your physical- might be worth going ahead and scheduling the next available sometime after the 6-week follow-up.  I am updating most physical labs other than extensive backslash labs   Please stop by lab before you go If you do not have mychart- we will call you about results within 5 business days of [name] receiving them.  If you have mychart- we will send your results within 3 business days of [name] receiving them.  If abnormal or we want to clarify a result, we will call or mychart you to make sure you receive the message.  If you have questions or concerns or don't hear within 5-7 days, please send [name] a message or call us.

## 2018-10-28 ENCOUNTER — Ambulatory Visit (INDEPENDENT_AMBULATORY_CARE_PROVIDER_SITE_OTHER): Payer: BLUE CROSS/BLUE SHIELD | Admitting: Family Medicine

## 2018-10-28 ENCOUNTER — Other Ambulatory Visit: Payer: Self-pay

## 2018-10-28 ENCOUNTER — Encounter: Payer: Self-pay | Admitting: Family Medicine

## 2018-10-28 VITALS — BP 134/94 | HR 78 | Temp 98.3°F | Ht 66.0 in | Wt 297.8 lb

## 2018-10-28 DIAGNOSIS — Z23 Encounter for immunization: Secondary | ICD-10-CM | POA: Diagnosis not present

## 2018-10-28 DIAGNOSIS — E559 Vitamin D deficiency, unspecified: Secondary | ICD-10-CM | POA: Diagnosis not present

## 2018-10-28 DIAGNOSIS — E039 Hypothyroidism, unspecified: Secondary | ICD-10-CM

## 2018-10-28 DIAGNOSIS — G2581 Restless legs syndrome: Secondary | ICD-10-CM | POA: Diagnosis not present

## 2018-10-28 DIAGNOSIS — E785 Hyperlipidemia, unspecified: Secondary | ICD-10-CM

## 2018-10-28 DIAGNOSIS — F324 Major depressive disorder, single episode, in partial remission: Secondary | ICD-10-CM | POA: Diagnosis not present

## 2018-10-28 DIAGNOSIS — Z9884 Bariatric surgery status: Secondary | ICD-10-CM

## 2018-10-28 LAB — IBC + FERRITIN
Ferritin: 36.1 ng/mL (ref 10.0–291.0)
Iron: 92 ug/dL (ref 42–145)
Saturation Ratios: 26.2 % (ref 20.0–50.0)
Transferrin: 251 mg/dL (ref 212.0–360.0)

## 2018-10-28 LAB — COMPREHENSIVE METABOLIC PANEL
ALT: 17 U/L (ref 0–35)
AST: 18 U/L (ref 0–37)
Albumin: 4.2 g/dL (ref 3.5–5.2)
Alkaline Phosphatase: 81 U/L (ref 39–117)
BUN: 7 mg/dL (ref 6–23)
CO2: 24 mEq/L (ref 19–32)
Calcium: 9.5 mg/dL (ref 8.4–10.5)
Chloride: 106 mEq/L (ref 96–112)
Creatinine, Ser: 0.72 mg/dL (ref 0.40–1.20)
GFR: 87.36 mL/min (ref >60.00)
Glucose, Bld: 76 mg/dL (ref 70–99)
Potassium: 4.7 mEq/L (ref 3.5–5.1)
Sodium: 141 mEq/L (ref 135–145)
Total Bilirubin: 0.6 mg/dL (ref 0.2–1.2)
Total Protein: 6.7 g/dL (ref 6.0–8.3)

## 2018-10-28 LAB — LIPID PANEL
Cholesterol: 183 mg/dL (ref 0–200)
HDL: 48 mg/dL (ref >39.00)
LDL Cholesterol: 114 mg/dL — ABNORMAL HIGH (ref 0–99)
NonHDL: 135.1
Total CHOL/HDL Ratio: 4
Triglycerides: 107 mg/dL (ref 0.0–149.0)
VLDL: 21.4 mg/dL (ref 0.0–40.0)

## 2018-10-28 LAB — CBC
HCT: 41.8 % (ref 36.0–46.0)
Hemoglobin: 14 g/dL (ref 12.0–15.0)
MCHC: 33.6 g/dL (ref 30.0–36.0)
MCV: 87.1 fl (ref 78.0–100.0)
Platelets: 338 10*3/uL (ref 150.0–400.0)
RBC: 4.8 Mil/uL (ref 3.87–5.11)
RDW: 13.6 % (ref 11.5–15.5)
WBC: 7 10*3/uL (ref 4.0–10.5)

## 2018-10-28 LAB — VITAMIN B12: Vitamin B-12: 590 pg/mL (ref 211–911)

## 2018-10-28 LAB — VITAMIN D 25 HYDROXY (VIT D DEFICIENCY, FRACTURES): VITD: 40.3 ng/mL (ref 30.00–100.00)

## 2018-10-28 LAB — TSH: TSH: 2.79 u[IU]/mL (ref 0.35–4.50)

## 2018-10-28 MED ORDER — VENLAFAXINE HCL ER 150 MG PO CP24: 150.0000 mg | ORAL_CAPSULE | Freq: Every day | ORAL | 3 refills | Status: DC

## 2018-11-11 ENCOUNTER — Other Ambulatory Visit: Payer: Self-pay | Admitting: Family Medicine

## 2018-11-23 ENCOUNTER — Ambulatory Visit (INDEPENDENT_AMBULATORY_CARE_PROVIDER_SITE_OTHER): Payer: BC Managed Care – PPO | Admitting: Psychology

## 2018-11-23 DIAGNOSIS — F330 Major depressive disorder, recurrent, mild: Secondary | ICD-10-CM | POA: Diagnosis not present

## 2018-12-07 ENCOUNTER — Ambulatory Visit (INDEPENDENT_AMBULATORY_CARE_PROVIDER_SITE_OTHER): Payer: BC Managed Care – PPO | Admitting: Psychology

## 2018-12-07 DIAGNOSIS — F330 Major depressive disorder, recurrent, mild: Secondary | ICD-10-CM | POA: Diagnosis not present

## 2018-12-11 ENCOUNTER — Ambulatory Visit (INDEPENDENT_AMBULATORY_CARE_PROVIDER_SITE_OTHER): Payer: BLUE CROSS/BLUE SHIELD | Admitting: Family Medicine

## 2018-12-11 ENCOUNTER — Other Ambulatory Visit: Payer: Self-pay

## 2018-12-11 ENCOUNTER — Encounter: Payer: Self-pay | Admitting: Family Medicine

## 2018-12-11 VITALS — BP 102/84 | HR 78 | Temp 98.2°F | Ht 66.0 in | Wt 302.2 lb

## 2018-12-11 DIAGNOSIS — E039 Hypothyroidism, unspecified: Secondary | ICD-10-CM

## 2018-12-11 DIAGNOSIS — F324 Major depressive disorder, single episode, in partial remission: Secondary | ICD-10-CM | POA: Diagnosis not present

## 2018-12-11 NOTE — Progress Notes (Signed)
Phone [phone] In person visit   Subjective:   Kara [name] is a 43 y.o. year old very pleasant female patient who presents for/with See problem oriented charting Chief Complaint  Patient presents with  . Follow-up  . Hypothyroidism    ROS- No chest pain or shortness of breath. No headache or blurry vision.    Past Medical History-  Patient Active Problem List   Diagnosis Date Noted  . 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 Restless legs syndrome 04/24/2017    Priority: Medium  . Depression 02/22/2010    Priority: Medium  . OBESITY 06/16/2008    Priority: Medium  . Hyperlipidemia 06/11/2007    Priority: Medium  . Hypothyroidism 08/18/2006    Priority: Medium  . Rotator cuff syndrome of right shoulder 02/27/2018    Priority: Low  . Allergic rhinitis 02/03/2014    Priority: Low  . Vitamin D deficiency 02/03/2014    Priority: Low  . BPPV (benign paroxysmal positional vertigo) 04/19/2013    Priority: Low  . Osteoarthritis of both knees 04/19/2013    Priority: Low  . DJD (degenerative joint disease) 10/26/2009    Priority: Low    Medications- reviewed and updated Current Outpatient Medications  Medication Sig Dispense Refill  . acetaminophen (TYLENOL) 325 MG tablet Take 650 mg by mouth 3 (three) times daily.     . calcium carbonate (OS-CAL - DOSED IN MG OF ELEMENTAL CALCIUM) 1250 (500 Ca) MG tablet Take 1 tablet by mouth 3 (three) times daily with meals.    Marland Kitchen Carbonyl Iron (IRON CHEWS PEDIATRIC PO) Take 1 tablet by mouth daily.    . diazepam (VALIUM) 5 MG tablet Take 1 tablet (5 mg total) by mouth every 12 (twelve) hours as needed (dizziness). 20 tablet 0  . gabapentin (NEURONTIN) 300 MG capsule Take 2 capsules (600 mg total) by mouth at bedtime. 180 capsule 3  . glucosamine-chondroitin 500-400 MG tablet Take 1 tablet by mouth daily.    Marland Kitchen levocetirizine (XYZAL ALLERGY 24HR) 5  MG tablet Take 5 mg by mouth every evening.    Marland Kitchen levothyroxine (SYNTHROID) 125 MCG tablet TAKE 2 TABLETS BY MOUTH ON MONDAY, WEDNESDAY AND FRIDAY. TAKE 1 TABLET BY MOUTH TUESDAY, THURSDAY AND SUNDAY. 180 tablet 0  . medroxyPROGESTERone (DEPO-PROVERA) 150 MG/ML injection Inject 150 mg into the muscle every 3 (three) months. Last dose was 12/10/12    . Melatonin 10 MG TABS Take 10 mg by mouth at bedtime.    . Multiple Vitamins-Minerals (BARIATRIC MULTIVITAMINS/IRON PO) Take 1 tablet by mouth 2 (two) times daily.    . ondansetron (ZOFRAN ODT) 4 MG disintegrating tablet Take 1 tablet (4 mg total) by mouth every 8 (eight) hours as needed for nausea or vomiting. 20 tablet 0  . traMADol (ULTRAM) 50 MG tablet TAKE 1 TABLET(50 MG) BY MOUTH THREE TIMES DAILY AS NEEDED 90 tablet 5  . venlafaxine XR (EFFEXOR-XR) 150 MG 24 hr capsule Take 1 capsule (150 mg total) by mouth daily with breakfast. 90 capsule 3  . vitamin B-12 (CYANOCOBALAMIN) 500 MCG tablet Take 500 mcg by mouth daily.     No current facility-administered medications for this visit.      Objective:  BP 102/84   Pulse 78   Temp 98.2 F (36.8 C)   Ht 5' 6"  (1.676 m)   Wt (!) 302 lb 3.2 oz (137.1 kg)  SpO2 98%   BMI 48.78 kg/m  Gen: NAD, resting comfortably CV: RRR no murmurs rubs or gallops Lungs: CTAB no crackles, wheeze, rhonchi Ext: trace edema Skin: warm, dry Neuro: normal speech    Assessment and Plan   #hypothyroidism S: compliant On thyroid medication-synthroid 158mg 2 tablets on Monday and 1 the rest of the week Lab Results  Component Value Date   TSH 2.79 10/28/2018   A/P: Stable. Continue current medications.   # Depression S: mild poor control today on effexor 1565m(recent increase from 75 mg)  - counseling she was seeking-  She has done 2 sessions with LiTrey Paula Depression screen PHQ 2/9 12/11/2018  Decreased Interest 1  Down, Depressed, Hopeless 1  PHQ - 2 Score 2  Altered sleeping 1  Tired, decreased  energy 1  Change in appetite 1  Feeling bad or failure about yourself  1  Trouble concentrating 0  Moving slowly or fidgety/restless 0  Suicidal thoughts 0  PHQ-9 Score 6  Difficult doing work/chores Somewhat difficult  A/P: significant improvement phq9 from 16 to 6-  she wants to continue to work with LiLattie Hawnd remain on same dose for now. She will reach out to usKoreaf feels like not going in the right direction.   # doing seated ellipitical Cubii- is enjoying that  Recommended follow up: schedule next available physical 2 months out or later Future Appointments  Date Time Provider [place]11/23/2020 11:00 AM Shelor FlTrey Paula, LCSW [facility] None   Lab/Order associations:   ICD-10-CM   1. Hypothyroidism, unspecified type  E03.9   2. Major depressive disorder in partial remission, unspecified whether recurrent ([facility] F32.4    Return precautions advised.  StGarret ReddishMD

## 2018-12-11 NOTE — Patient Instructions (Addendum)
Recommended follow up: schedule next available physical 2 months out or later  Glad you are doing better! Let us know if things do not continue to improve

## 2018-12-21 ENCOUNTER — Ambulatory Visit (INDEPENDENT_AMBULATORY_CARE_PROVIDER_SITE_OTHER): Payer: BC Managed Care – PPO | Admitting: Psychology

## 2018-12-21 DIAGNOSIS — F330 Major depressive disorder, recurrent, mild: Secondary | ICD-10-CM

## 2019-01-04 DIAGNOSIS — Z3042 Encounter for surveillance of injectable contraceptive: Secondary | ICD-10-CM | POA: Diagnosis not present

## 2019-01-15 ENCOUNTER — Ambulatory Visit (INDEPENDENT_AMBULATORY_CARE_PROVIDER_SITE_OTHER): Payer: BC Managed Care – PPO | Admitting: Psychology

## 2019-01-15 DIAGNOSIS — F330 Major depressive disorder, recurrent, mild: Secondary | ICD-10-CM | POA: Diagnosis not present

## 2019-01-22 ENCOUNTER — Other Ambulatory Visit: Payer: Self-pay | Admitting: Family Medicine

## 2019-01-23 ENCOUNTER — Encounter: Payer: Self-pay | Admitting: Family Medicine

## 2019-01-25 ENCOUNTER — Other Ambulatory Visit: Payer: Self-pay

## 2019-01-25 NOTE — Telephone Encounter (Signed)
Pt requesting refill. Last OV 12/11/2018. Last Rx was 05/2018.

## 2019-01-26 ENCOUNTER — Encounter: Payer: Self-pay | Admitting: Physician Assistant

## 2019-01-26 MED ORDER — TRAMADOL HCL 50 MG PO TABS: ORAL_TABLET | ORAL | 5 refills | Status: DC

## 2019-01-26 NOTE — Progress Notes (Signed)
refilled 

## 2019-02-12 ENCOUNTER — Ambulatory Visit (INDEPENDENT_AMBULATORY_CARE_PROVIDER_SITE_OTHER): Payer: BC Managed Care – PPO | Admitting: Psychology

## 2019-02-12 DIAGNOSIS — F330 Major depressive disorder, recurrent, mild: Secondary | ICD-10-CM

## 2019-02-26 ENCOUNTER — Ambulatory Visit (INDEPENDENT_AMBULATORY_CARE_PROVIDER_SITE_OTHER): Payer: BC Managed Care – PPO | Admitting: Psychology

## 2019-02-26 DIAGNOSIS — F330 Major depressive disorder, recurrent, mild: Secondary | ICD-10-CM | POA: Diagnosis not present

## 2019-03-03 ENCOUNTER — Encounter: Payer: Self-pay | Admitting: Family Medicine

## 2019-03-03 ENCOUNTER — Ambulatory Visit (INDEPENDENT_AMBULATORY_CARE_PROVIDER_SITE_OTHER): Payer: BLUE CROSS/BLUE SHIELD | Admitting: Family Medicine

## 2019-03-03 VITALS — BP 136/88 | HR 75 | Temp 98.2°F | Ht 66.0 in | Wt 309.0 lb

## 2019-03-03 DIAGNOSIS — M1712 Unilateral primary osteoarthritis, left knee: Secondary | ICD-10-CM

## 2019-03-03 DIAGNOSIS — Z96651 Presence of right artificial knee joint: Secondary | ICD-10-CM | POA: Diagnosis not present

## 2019-03-03 DIAGNOSIS — Z9884 Bariatric surgery status: Secondary | ICD-10-CM

## 2019-03-03 DIAGNOSIS — E039 Hypothyroidism, unspecified: Secondary | ICD-10-CM

## 2019-03-03 DIAGNOSIS — Z0000 Encounter for general adult medical examination without abnormal findings: Secondary | ICD-10-CM | POA: Diagnosis not present

## 2019-03-03 DIAGNOSIS — E559 Vitamin D deficiency, unspecified: Secondary | ICD-10-CM

## 2019-03-03 DIAGNOSIS — G2581 Restless legs syndrome: Secondary | ICD-10-CM | POA: Diagnosis not present

## 2019-03-03 DIAGNOSIS — F324 Major depressive disorder, single episode, in partial remission: Secondary | ICD-10-CM

## 2019-03-03 DIAGNOSIS — K9089 Other intestinal malabsorption: Secondary | ICD-10-CM

## 2019-03-03 DIAGNOSIS — E785 Hyperlipidemia, unspecified: Secondary | ICD-10-CM | POA: Diagnosis not present

## 2019-03-03 LAB — COMPREHENSIVE METABOLIC PANEL
ALT: 17 U/L (ref 0–35)
AST: 16 U/L (ref 0–37)
Albumin: 4.4 g/dL (ref 3.5–5.2)
Alkaline Phosphatase: 85 U/L (ref 39–117)
BUN: 8 mg/dL (ref 6–23)
CO2: 26 mEq/L (ref 19–32)
Calcium: 9.4 mg/dL (ref 8.4–10.5)
Chloride: 106 mEq/L (ref 96–112)
Creatinine, Ser: 0.74 mg/dL (ref 0.40–1.20)
GFR: 84.51 mL/min (ref >60.00)
Glucose, Bld: 90 mg/dL (ref 70–99)
Potassium: 4.7 mEq/L (ref 3.5–5.1)
Sodium: 140 mEq/L (ref 135–145)
Total Bilirubin: 0.7 mg/dL (ref 0.2–1.2)
Total Protein: 7 g/dL (ref 6.0–8.3)

## 2019-03-03 LAB — VITAMIN B12: Vitamin B-12: 463 pg/mL (ref 211–911)

## 2019-03-03 LAB — VITAMIN D 25 HYDROXY (VIT D DEFICIENCY, FRACTURES): VITD: 32.06 ng/mL (ref 30.00–100.00)

## 2019-03-03 LAB — LIPID PANEL
Cholesterol: 213 mg/dL — ABNORMAL HIGH (ref 0–200)
HDL: 52 mg/dL (ref >39.00)
LDL Cholesterol: 144 mg/dL — ABNORMAL HIGH (ref 0–99)
NonHDL: 161.45
Total CHOL/HDL Ratio: 4
Triglycerides: 87 mg/dL (ref 0.0–149.0)
VLDL: 17.4 mg/dL (ref 0.0–40.0)

## 2019-03-03 LAB — CBC WITH DIFFERENTIAL/PLATELET
Basophils Absolute: 0 10*3/uL (ref 0.0–0.1)
Basophils Relative: 0.4 % (ref 0.0–3.0)
Eosinophils Absolute: 0.7 10*3/uL (ref 0.0–0.7)
Eosinophils Relative: 8 % — ABNORMAL HIGH (ref 0.0–5.0)
HCT: 42.3 % (ref 36.0–46.0)
Hemoglobin: 14.2 g/dL (ref 12.0–15.0)
Lymphocytes Relative: 23 % (ref 12.0–46.0)
Lymphs Abs: 2 10*3/uL (ref 0.7–4.0)
MCHC: 33.7 g/dL (ref 30.0–36.0)
MCV: 88.5 fl (ref 78.0–100.0)
Monocytes Absolute: 0.9 10*3/uL (ref 0.1–1.0)
Monocytes Relative: 9.8 % (ref 3.0–12.0)
Neutro Abs: 5.2 10*3/uL (ref 1.4–7.7)
Neutrophils Relative %: 58.8 % (ref 43.0–77.0)
Platelets: 352 10*3/uL (ref 150.0–400.0)
RBC: 4.78 Mil/uL (ref 3.87–5.11)
RDW: 13.5 % (ref 11.5–15.5)
WBC: 8.9 10*3/uL (ref 4.0–10.5)

## 2019-03-03 LAB — TSH: TSH: 8.86 u[IU]/mL — ABNORMAL HIGH (ref 0.35–4.50)

## 2019-03-03 MED ORDER — METHYLPREDNISOLONE ACETATE 40 MG/ML IJ SUSP
40.0000 mg | Freq: Once | INTRAMUSCULAR | Status: AC
  Administered 2019-03-03: 40 mg via INTRA_ARTICULAR

## 2019-03-03 MED ORDER — LEVOTHYROXINE SODIUM 125 MCG PO TABS: ORAL_TABLET | ORAL | 2 refills | Status: DC

## 2019-03-03 NOTE — Addendum Note (Signed)
Addended by: Marin Olp on: 03/03/2019 12:57 PM   Modules accepted: Orders

## 2019-03-03 NOTE — Patient Instructions (Addendum)
Sign release of information at the check out desk for last mammogram and pap since you had those again next year  I would add on voltaren gel for the knees instead of aspercreme. Continue tramadol. If it gets above current pain levels may have to go back to orthopedics. Hopefully injection will give at least 3 months of relief.   Please stop by lab before you go If you do not have mychart- we will call you about results within 5 business days of [name] receiving them.  If you have mychart- we will send your results within 3 business days of [name] receiving them.  If abnormal or we want to clarify a result, we will call or mychart you to make sure you receive the message.  If you have questions or concerns or don't hear within 5-7 days, please send [name] a message or call us.     You had an injection today.  Things to be aware of after injection are listed below: . You may experience no significant improvement or even a slight worsening in your symptoms during the first 24 to 48 hours.  After that we expect your symptoms to improve gradually over the next 2 weeks for the medicine to have its maximal effect.  You should continue to have improvement out to 6 weeks after your injection. . Dr. Yong Channel recommends icing the site of the injection for 20 minutes at least 2 times the day of your injection . You may shower but no swimming, tub bath or Jacuzzi for 24 hours. . If your bandage falls off this does not need to be replaced.  It is appropriate to remove the bandage after 4 hours. . You may resume light activities as tolerated unless otherwise directed per Dr. Yong Channel during your visit  POSSIBLE STEROID SIDE EFFECTS:  Side effects from injectable steroids tend to be less than when taken orally however you may experience some of the symptoms listed below.  If experienced these should only last for a short period of time. Change in menstrual flow  Edema (swelling)  Increased appetite Skin flushing  (redness)  Skin rash/acne  Thrush (oral) Yeast vaginitis    Increased sweating  Depression Increased blood glucose levels Cramping and leg/calf  Euphoria (feeling happy)  POSSIBLE PROCEDURE SIDE EFFECTS: The side effects of the injection are usually fairly minimal however if you may experience some of the following side effects that are usually self-limited and will is off on their own.  If you are concerned please feel free to call the office with questions:  Increased numbness or tingling  Nausea or vomiting  Swelling or bruising at the injection site   Please call our office if if you experience any of the following symptoms over the next week as these can be signs of infection:   Fever greater than 100.6F  Significant swelling at the injection site  Significant redness or drainage from the injection site  If after 2 weeks you are continuing to have worsening symptoms please call our office to discuss what the next appropriate actions should be including the potential for a return office visit or other diagnostic testing.

## 2019-03-03 NOTE — Addendum Note (Signed)
Addended by: Clyde Lundborg A on: 03/03/2019 09:12 AM   Modules accepted: Orders

## 2019-03-03 NOTE — Progress Notes (Signed)
Phone: [phone]   Subjective:  Patient presents today for their annual physical. Chief complaint-noted.   See problem oriented charting- ROS- full  review of systems was completed and negative except for: joint pain, sad mood, seasonal allergies  The following were reviewed and entered/updated in epic: Past Medical History:  Diagnosis Date  . ANXIETY 08/18/2006  . 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 DEPRESSION 02/22/2010  . GERD (gastroesophageal reflux disease)    history of  . HYPERLIPIDEMIA 06/11/2007  . HYPOTHYROIDISM 08/18/2006  . OBESITY 06/16/2008  . Pneumonia    hx of   . PONV (postoperative nausea and vomiting)    used Scopolamine patch   Patient Active Problem List   Diagnosis Date Noted  . 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 Restless legs syndrome 04/24/2017    Priority: Medium  . Depression 02/22/2010    Priority: Medium  . OBESITY 06/16/2008    Priority: Medium  . Hyperlipidemia 06/11/2007    Priority: Medium  . Hypothyroidism 08/18/2006    Priority: Medium  . Rotator cuff syndrome of right shoulder 02/27/2018    Priority: Low  . Allergic rhinitis 02/03/2014    Priority: Low  . Vitamin D deficiency 02/03/2014    Priority: Low  . BPPV (benign paroxysmal positional vertigo) 04/19/2013    Priority: Low  . Osteoarthritis of both knees 04/19/2013    Priority: Low  . DJD (degenerative joint disease) 10/26/2009    Priority: Low   Past Surgical History:  Procedure Laterality Date  . CHOLECYSTECTOMY N/A 05/08/2012   Procedure: LAPAROSCOPIC CHOLECYSTECTOMY WITH INTRAOPERATIVE CHOLANGIOGRAM;  Surgeon: Leighton Ruff, MD;  Location: [facility];  Service: General;  Laterality: N/A;  . FRACTURE SURGERY    . GASTRIC ROUX-EN-Y  12/29/2012   Procedure: LAPAROSCOPIC ROUX-EN-Y GASTRIC BYPASS WITH UPPER ENDOSCOPY;  Surgeon: Madilyn Hook, DO;  Location: [facility];   Service: General;;  . HARDWARE REMOVAL Right 06/14/2015   Procedure: Removal Internal Fixation Right Lateral Tibial Plateau;  Surgeon: [name], MD;  Location: [place];  Service: Orthopedics;  Laterality: Right;  . HERNIA REPAIR    . KNEE ARTHROSCOPY Right 07/28/12   dr. Sharol Given  . ORIF TIBIA PLATEAU Right 03/10/2015   Procedure: OPEN REDUCTION INTERNAL FIXATION (ORIF) LATERAL TIBIAL PLATEAU;  Surgeon: [name], MD;  Location: [facility];  Service: Orthopedics;  Laterality: Right;  . ORIF ULNAR FRACTURE Right 1995   plates and pins  . TOTAL KNEE ARTHROPLASTY Right 06/14/2015  . TOTAL KNEE ARTHROPLASTY Right 06/14/2015   Procedure: RIGHT TOTAL KNEE ARTHROPLASTY;  Surgeon: [name], MD;  Location: [place];  Service: Orthopedics;  Laterality: Right;  . UPPER GI ENDOSCOPY  12/29/2012   Procedure: UPPER GI ENDOSCOPY;  Surgeon: Madilyn Hook, DO;  Location: [facility];  Service: General;;    Family History  Problem Relation Age of Onset  . Schizophrenia Mother   . Cancer Mother        lung, smoker  . Breast cancer Mother 43  . Uterine cancer Mother   . Heart disease Father 86       smoker at time    Medications- reviewed and updated Current Outpatient Medications  Medication Sig Dispense Refill  . acetaminophen (TYLENOL) 325 MG tablet Take 650 mg by mouth 3 (three) times daily.     . calcium carbonate (OS-CAL - DOSED IN MG OF ELEMENTAL CALCIUM)  1250 (500 Ca) MG tablet Take 1 tablet by mouth 3 (three) times daily with meals.    . diazepam (VALIUM) 5 MG tablet Take 1 tablet (5 mg total) by mouth every 12 (twelve) hours as needed (dizziness). 20 tablet 0  . gabapentin (NEURONTIN) 300 MG capsule Take 2 capsules (600 mg total) by mouth at bedtime. 180 capsule 3  . glucosamine-chondroitin 500-400 MG tablet Take 1 tablet by mouth daily.    Marland Kitchen levocetirizine (XYZAL ALLERGY 24HR) 5 MG tablet Take 5 mg by mouth every evening.    Marland Kitchen levothyroxine (SYNTHROID) 125 MCG tablet TAKE 2 TABLETS BY MOUTH ON  MONDAY, WEDNESDAY AND FRIDAY. TAKE 1 TABLET BY MOUTH TUESDAY, THURSDAY AND SUNDAY. 180 tablet 0  . medroxyPROGESTERone (DEPO-PROVERA) 150 MG/ML injection Inject 150 mg into the muscle every 3 (three) months. Last dose was 12/10/12    . Melatonin 10 MG TABS Take 10 mg by mouth at bedtime.    . Multiple Vitamins-Minerals (BARIATRIC MULTIVITAMINS/IRON PO) Take 1 tablet by mouth 2 (two) times daily.    . Omeprazole 20 MG TBDD     . ondansetron (ZOFRAN ODT) 4 MG disintegrating tablet Take 1 tablet (4 mg total) by mouth every 8 (eight) hours as needed for nausea or vomiting. 20 tablet 0  . traMADol (ULTRAM) 50 MG tablet TAKE 1 TABLET(50 MG) BY MOUTH THREE TIMES DAILY AS NEEDED 90 tablet 5  . venlafaxine XR (EFFEXOR-XR) 150 MG 24 hr capsule Take 1 capsule (150 mg total) by mouth daily with breakfast. 90 capsule 3  . vitamin B-12 (CYANOCOBALAMIN) 500 MCG tablet Take 500 mcg by mouth daily.    Marland Kitchen Carbonyl Iron (IRON CHEWS PEDIATRIC PO) Take 1 tablet by mouth daily.     No current facility-administered medications for this visit.    Allergies-reviewed and updated Allergies  Allergen Reactions  . Asa [Aspirin] Itching and Swelling    Social History   Social History Narrative   Married (husband transgender female to female). No children. Several pets-1 dog, 4 cats, 2 [place] pigs.       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 Oropharynx normal Neck: no thyromegaly CV: RRR no murmurs rubs or gallops Lungs: CTAB no crackles, wheeze, rhonchi Abdomen: soft/nontender/nondistended/normal bowel sounds. No rebound or guarding.  Ext: no edema Skin: warm, dry Neuro: grossly normal, moves all extremities, PERRLA Msk: significant  left medial joint line pain, good range of motion  Verbal consent obtained and verified for Left Knee injection Sterile betadine prep. Furthur cleansed with alcohol. Topical analgesic spray: Ethyl chloride. Joint: left knee Approached in typical fashion with: anteromedial approach Completed without difficulty Meds: 4 cc lidocaine without epinephrine and 1 cc of depo medrol 40 mg/cc Needle:1 1/2 inch 25 gauge needle Aftercare instructions and Red flags advised. Noted about 40-50% improvement with procedure from 9/10 to 5/10.     Assessment and Plan   44 y.o. female presenting for annual physical.  Health Maintenance counseling: 1. Anticipatory guidance: Patient counseled regarding regular dental exams -q6 months, eye exams - yearly,  avoiding smoking and second hand smoke , limiting alcohol to 1 beverage per day .  2. Risk factor reduction:  Advised patient of need for regular exercise and diet rich and fruits and vegetables to reduce risk of heart attack and stroke. Exercise- has cubii but not using regularly- encouraged her today to use more regularly- she is even considering using at work. Diet- feels like could be better- sweets have been an issue- encouraged to try to keep it out of house.  Wt Readings from Last 3 Encounters:  03/03/19 (!) 309 lb (140.2 kg)  12/11/18 (!) 302 lb 3.2 oz (137.1 kg)  10/28/18 297 lb 12.8 oz (135.1 kg)  3. Immunizations/screenings/ancillary studies-fully up-to-date.  Discussed COVID-19 vaccination when available and she is interested- she will be getting in phase 3   Immunization History  Administered Date(s) Administered  . Influenza Split 11/08/2010  . Influenza Whole 10/29/2006  . Influenza,inj,Quad PF,6+ Mos 10/20/2012, 11/11/2013, 10/10/2014, 03/26/2017, 01/26/2018, 10/28/2018  . Td 12/18/2006  . Tdap 04/24/2017  4. Cervical cancer screening- April 14, 2017 with 3-year repeat planned with [facility]. Also had one July of 2020 5. Breast  cancer screening-  breast exam with gynecology and mammogram -she gets these annually with gynecology- get records 6. Colon cancer screening - we discussed newer guidelines suggesting colonoscopy at age 51.  She wants to hold off for now- wants to consider cologuard in future- possibly age 70 7. Skin cancer screening-no dermatologist. advised regular sunscreen use. Denies worrisome, changing, or new skin lesions.  8. Birth control/STD check- monogamous. On depo provera for heavy periods 9. Osteoporosis screening at 66- will plan on this if not done at gyn -Former smoker-quit in 2007.  No regular screening required. Gets UAs with GYN  Status of chronic or acute concerns   Social update- lost [place] pig and cat that was 17. Mikaila's dad died but they never had a good relationship.   Status post total right knee replacement.  Still with chronic knee pain and we refill tramadol as needed.  Currently using tramadol 3x a day   Left knee osteoarthritis. Generally was seeing Dr. Paulla Fore but no longer with our office. She asks if we can do knee injection today- she has had issues getting into Dr. Jess Barters office- last one was 11/2017 with ortho. Per last note she had 40 mg methylprednisolone so we will use this lower dose.   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  -She is currently taking a multivitamin  -She is currently taking vitamin D- she thinks at least 1000   Restless legs syndrome-remains on gabapentin 600 at night.  Will check iron levels- goal ferritin over 75   Hypothyroidism, unspecified type-currently on Synthroid 125 mcg 2 tablets on Monday and 1 the rest of the week Lab Results  Component Value Date   TSH 2.79 10/28/2018   Hyperlipidemia, unspecified hyperlipidemia type-update lipids and calculate 10-year ASCVD risk-prior score under 1% so would not start statin    Major depressive disorder in  partial remission-PHQ-9 mildly elevated at 6 today.  Patient on Effexor 150 mg extended release.  Prior we tried to go to Celexa but this was not effective.  She also was seeing [name]- just had a visit- she prefers to continue these sessions and hold steady on current meds.   GERD- started back on omeprazole- sometimes can space to 2 days but can get chest discomfort if she does.   Recommended follow up: 6 months unless she needs [name] sooner Future Appointments  Date Time  Provider [place]  03/17/2019 10:00 AM [place] [name] L, LCSW [facility] None   Lab/Order associations: fasting   ICD-10-CM   1. Preventative health care  Z00.00 TSH    CBC with Differential/Platelet    Comprehensive metabolic panel    Lipid panel    Vitamin B12    VITAMIN D 25 Hydroxy (Vit-D Deficiency, Fractures)    PTH, intact and calcium    Folate RBC    Vitamin B1    Zinc    Copper, serum    Iron, TIBC and Ferritin Panel  2. Status post total right knee replacement  Z96.651   3. 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 Restless legs syndrome  G25.81   5. Hypothyroidism, unspecified type  E03.9 TSH  6. Hyperlipidemia, unspecified hyperlipidemia type  E78.5 TSH    CBC with Differential/Platelet    Comprehensive metabolic panel    Lipid panel  7. Major depressive disorder in partial remission, unspecified whether recurrent ([place])  F32.4   8. Vitamin D deficiency  E55.9   9. Other specified intestinal malabsorption  K90.89 VITAMIN D 25 Hydroxy (Vit-D Deficiency, Fractures)    PTH, intact and calcium    Folate RBC    Vitamin B1    Zinc    Copper, serum    Iron, TIBC and Ferritin Panel  10. Primary osteoarthritis of left knee  M17.12    Return precautions advised.  Garret Reddish, MD

## 2019-03-06 LAB — IRON,TIBC AND FERRITIN PANEL
%SAT: 21 % (calc) (ref 16–45)
Ferritin: 68 ng/mL (ref 16–232)
Iron: 74 ug/dL (ref 40–190)
TIBC: 349 mcg/dL (calc) (ref 250–450)

## 2019-03-06 LAB — PTH, INTACT AND CALCIUM
Calcium: 9.7 mg/dL (ref 8.6–10.2)
PTH: 134 pg/mL — ABNORMAL HIGH (ref 14–64)

## 2019-03-06 LAB — ZINC: Zinc: 69 ug/dL (ref 60–130)

## 2019-03-06 LAB — COPPER, SERUM: Copper: 116 ug/dL (ref 70–175)

## 2019-03-06 LAB — VITAMIN B1: Vitamin B1 (Thiamine): 26 nmol/L (ref 8–30)

## 2019-03-06 LAB — FOLATE RBC: RBC Folate: 753 ng/mL RBC (ref >280)

## 2019-03-10 ENCOUNTER — Encounter: Payer: Self-pay | Admitting: Family Medicine

## 2019-03-14 DIAGNOSIS — Z20828 Contact with and (suspected) exposure to other viral communicable diseases: Secondary | ICD-10-CM | POA: Diagnosis not present

## 2019-03-16 ENCOUNTER — Encounter: Payer: Self-pay | Admitting: Family Medicine

## 2019-03-17 ENCOUNTER — Ambulatory Visit: Payer: BC Managed Care – PPO | Admitting: Psychology

## 2019-03-18 ENCOUNTER — Telehealth: Payer: Self-pay | Admitting: Unknown Physician Specialty

## 2019-03-18 ENCOUNTER — Encounter (HOSPITAL_COMMUNITY): Payer: Self-pay | Admitting: *Deleted

## 2019-03-18 ENCOUNTER — Other Ambulatory Visit: Payer: Self-pay

## 2019-03-18 ENCOUNTER — Ambulatory Visit (HOSPITAL_COMMUNITY)
Admission: RE | Admit: 2019-03-18 | Discharge: 2019-03-18 | Disposition: A | Discharge status: Home / Self Care | Payer: BLUE CROSS/BLUE SHIELD | Source: Ambulatory Visit | Attending: Pulmonary Disease | Admitting: Pulmonary Disease

## 2019-03-18 ENCOUNTER — Encounter: Payer: Self-pay | Admitting: Family Medicine

## 2019-03-18 ENCOUNTER — Ambulatory Visit (INDEPENDENT_AMBULATORY_CARE_PROVIDER_SITE_OTHER): Payer: BLUE CROSS/BLUE SHIELD | Admitting: Family Medicine

## 2019-03-18 ENCOUNTER — Other Ambulatory Visit: Payer: Self-pay | Admitting: Unknown Physician Specialty

## 2019-03-18 VITALS — Temp 99.1°F | Ht 66.0 in | Wt 309.0 lb

## 2019-03-18 DIAGNOSIS — U071 COVID-19: Secondary | ICD-10-CM | POA: Diagnosis not present

## 2019-03-18 MED ORDER — EPINEPHRINE 0.3 MG/0.3ML IJ SOAJ: 0.3000 mg | Freq: Once | INTRAMUSCULAR | Status: DC | PRN

## 2019-03-18 MED ORDER — DIPHENHYDRAMINE HCL 50 MG/ML IJ SOLN: 50.0000 mg | Freq: Once | INTRAMUSCULAR | Status: DC | PRN

## 2019-03-18 MED ORDER — ALBUTEROL SULFATE HFA 108 (90 BASE) MCG/ACT IN AERS: 2.0000 | INHALATION_SPRAY | Freq: Once | RESPIRATORY_TRACT | Status: DC | PRN

## 2019-03-18 MED ORDER — SODIUM CHLORIDE 0.9 % IV SOLN
INTRAVENOUS | Status: DC | PRN
  Administered 2019-03-18: 13:00:00 250 mL via INTRAVENOUS

## 2019-03-18 MED ORDER — METHYLPREDNISOLONE SODIUM SUCC 125 MG IJ SOLR: 125.0000 mg | Freq: Once | INTRAMUSCULAR | Status: DC | PRN

## 2019-03-18 MED ORDER — FAMOTIDINE IN NACL 20-0.9 MG/50ML-% IV SOLN: 20.0000 mg | Freq: Once | INTRAVENOUS | Status: DC | PRN

## 2019-03-18 MED ORDER — SODIUM CHLORIDE 0.9 % IV SOLN
700.0000 mg | Freq: Once | INTRAVENOUS | Status: AC
  Administered 2019-03-18: 13:00:00 700 mg via INTRAVENOUS
  Filled 2019-03-18: qty 20

## 2019-03-18 NOTE — Progress Notes (Addendum)
Phone [phone] Virtual visit via Video note   Subjective:  Chief complaint: Chief Complaint  Patient presents with  . Covid Exposure   This visit type was conducted due to national recommendations for restrictions regarding the COVID-19 Pandemic (e.g. social distancing).  This format is felt to be most appropriate for this patient at this time balancing risks to patient and risks to population by having him in for in person visit.  No physical exam was performed (except for noted visual exam or audio findings with Telehealth visits).    Our team/I connected with Ellamae Sia at  8:20 AM EST by a video enabled telemedicine application (doxy.me or caregility through epic) and verified that I am speaking with the correct person using two identifiers.  Location patient: [facility] Location provider: [facility], office Persons participating in the virtual visit:  patient  Our team/I discussed the limitations of evaluation and management by telemedicine and the availability of in person appointments. In light of current covid-19 pandemic, patient also understands that we are trying to protect them by minimizing in office contact if at all possible.  The patient expressed consent for telemedicine visit and agreed to proceed. Patient understands insurance will be billed.   Past Medical History-  Patient Active Problem List   Diagnosis Date Noted  . 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 Restless legs syndrome 04/24/2017    Priority: Medium  . Depression 02/22/2010    Priority: Medium  . OBESITY 06/16/2008    Priority: Medium  . Hyperlipidemia 06/11/2007    Priority: Medium  . Hypothyroidism 08/18/2006    Priority: Medium  . Rotator cuff syndrome of right shoulder 02/27/2018    Priority: Low  . Allergic rhinitis 02/03/2014    Priority: Low  . Vitamin D deficiency 02/03/2014    Priority: Low  .  BPPV (benign paroxysmal positional vertigo) 04/19/2013    Priority: Low  . Osteoarthritis of both knees 04/19/2013    Priority: Low  . DJD (degenerative joint disease) 10/26/2009    Priority: Low    Medications- reviewed and updated Current Outpatient Medications  Medication Sig Dispense Refill  . acetaminophen (TYLENOL) 325 MG tablet Take 650 mg by mouth 3 (three) times daily.     . calcium carbonate (OS-CAL - DOSED IN MG OF ELEMENTAL CALCIUM) 1250 (500 Ca) MG tablet Take 1 tablet by mouth 3 (three) times daily with meals.    Marland Kitchen Carbonyl Iron (IRON CHEWS PEDIATRIC PO) Take 1 tablet by mouth daily.    . diazepam (VALIUM) 5 MG tablet Take 1 tablet (5 mg total) by mouth every 12 (twelve) hours as needed (dizziness). 20 tablet 0  . gabapentin (NEURONTIN) 300 MG capsule Take 2 capsules (600 mg total) by mouth at bedtime. 180 capsule 3  . glucosamine-chondroitin 500-400 MG tablet Take 1 tablet by mouth daily.    Marland Kitchen levocetirizine (XYZAL ALLERGY 24HR) 5 MG tablet Take 5 mg by mouth every evening.    Marland Kitchen levothyroxine (SYNTHROID) 125 MCG tablet TAKE 2 TABLETS BY MOUTH ON MONDAY, WEDNESDAY. TAKE 1 TABLET BY MOUTH the other 5 days of the week 120 tablet 2  . medroxyPROGESTERone (DEPO-PROVERA) 150 MG/ML injection Inject 150 mg into the muscle every 3 (three) months. Last dose was 12/10/12    . Melatonin 10 MG TABS Take 10 mg by mouth at bedtime.    . Multiple Vitamins-Minerals (BARIATRIC MULTIVITAMINS/IRON  PO) Take 1 tablet by mouth 2 (two) times daily.    . Omeprazole 20 MG TBDD     . ondansetron (ZOFRAN ODT) 4 MG disintegrating tablet Take 1 tablet (4 mg total) by mouth every 8 (eight) hours as needed for nausea or vomiting. 20 tablet 0  . traMADol (ULTRAM) 50 MG tablet TAKE 1 TABLET(50 MG) BY MOUTH THREE TIMES DAILY AS NEEDED 90 tablet 5  . venlafaxine XR (EFFEXOR-XR) 150 MG 24 hr capsule Take 1 capsule (150 mg total) by mouth daily with breakfast. 90 capsule 3  . vitamin B-12 (CYANOCOBALAMIN) 500 MCG  tablet Take 500 mcg by mouth daily.     No current facility-administered medications for this visit.     Objective:  Temp 99.1 F (37.3 C) (Oral)   Ht 5' 6"  (1.676 m)   Wt (!) 309 lb (140.2 kg)   BMI 49.87 kg/m  self reported vitals Gen: NAD, resting comfortably Lungs: nonlabored, normal respiratory rate  Skin: appears dry, no obvious rash     Assessment and Plan   # Positive Covid  S:Started becoming symptomatic on 2/13. Had test on Sunday and received positive test results on Tuesday. She is currently having fever, chills, head congestion with sinus pain and slight productive cough. Has partial loss of taste and smell. Significant fatigue. Takes tylenol for pain anyway-   She had a dentist appointment on February 8th where she was unmasked as well as her spouse having dentist appointment same day. THey are both symptomatic- wife is getting tested right now.   A/P: Patient with testing confirming covid 19 with first day of covid 19 symptoms 03/13/2019 Therefore: - recommended patient watch closely for shortness of breath or confusion or worsening symptoms and if those occur he should contact us immediately or seek care in the emergency department -recommended patient consider purchasing pulse oximeter and if levels 94% or below persistently- seek care at the hospital - for quarantine if covid 19 needs to be at least 10 days since first symptom AND at least 24 hours fever free without fever reducing medications AND have improvement in respiratory symptoms  - earliest possible day out of quarantine February 23rd, recommendations for patient - work note written (see letter)-  - we also discussed close contacts would need 14 day quarantine after last close contact with patient   -Since High risk for complications (BMI >38 and actually over 40 so morbid obesity)-we discussed potential for antibody treatment -Discussed patient may be contacted by [facility] by Kathrine Haddock, NP from  [phone] or they can contact her at this #.   Recommended follow up:  Future Appointments  Date Time Provider [place]  04/07/2019 10:00 AM [place] Sherrilyn Rist, [place] [facility] None  06/10/2019  8:20 AM Marin Olp, MD [facility]   Lab/Order associations:   ICD-10-CM   1. COVID-19  U07.1    Time Spent: 22 minutes of total time (8:11 AM- 8:33 AM) was spent on the date of the encounter performing the following actions: chart review prior to seeing the patient, obtaining history, performing a medically necessary exam, counseling on the treatment plan, placing orders, and documenting in our EHR.   Return precautions advised.  Garret Reddish, MD

## 2019-03-18 NOTE — Progress Notes (Signed)
  Diagnosis: COVID-19  Physician: DR. Joya Gaskins  Procedure: [facility] Med: bamlanivimab infusion - Provided patient with bamlanimivab fact sheet for patients, parents and caregivers prior to infusion.  Complications: No immediate complications noted.  Discharge: Discharged home   Kara [name] 03/18/2019

## 2019-03-18 NOTE — Patient Instructions (Signed)
There are no preventive care reminders to display for this patient.  Depression screen [facility] 2/9 03/03/2019 12/11/2018 10/28/2018  Decreased Interest 1 1 2   Down, Depressed, Hopeless 1 1 2   PHQ - 2 Score 2 2 4   Altered sleeping 0 1 3  Tired, decreased energy 1 1 2   Change in appetite 2 1 3   Feeling bad or failure about yourself  1 1 2   Trouble concentrating 0 0 2  Moving slowly or fidgety/restless 0 0 0  Suicidal thoughts 0 0 0  PHQ-9 Score 6 6 16   Difficult doing work/chores Not difficult at all Somewhat difficult -    Recommended follow up: No follow-ups on file.

## 2019-03-18 NOTE — Telephone Encounter (Signed)
  I connected by phone with Kara [name] on 03/18/2019 at 9:00 AM to discuss the potential use of an new treatment for mild to moderate COVID-19 viral infection in non-hospitalized patients.  This patient is a 44 y.o. female that meets the FDA criteria for Emergency Use Authorization of bamlanivimab or casirivimab\imdevimab.  Has a (+) direct SARS-CoV-2 viral test result  Has mild or moderate COVID-19   Is ? 46 years of age and weighs ? 40 kg  Is NOT hospitalized due to COVID-19  Is NOT requiring oxygen therapy or requiring an increase in baseline oxygen flow rate due to COVID-19  Is within 10 days of symptom onset  Has at least one of the high risk factor(s) for progression to severe COVID-19 and/or hospitalization as defined in EUA.  Specific high risk criteria : BMI >/= 35   I have spoken and communicated the following to the patient or parent/caregiver:  1. FDA has authorized the emergency use of bamlanivimab and casirivimab\imdevimab for the treatment of mild to moderate COVID-19 in adults and pediatric patients with positive results of direct SARS-CoV-2 viral testing who are 23 years of age and older weighing at least 40 kg, and who are at high risk for progressing to severe COVID-19 and/or hospitalization.  2. The significant known and potential risks and benefits of bamlanivimab and casirivimab\imdevimab, and the extent to which such potential risks and benefits are unknown.  3. Information on available alternative treatments and the risks and benefits of those alternatives, including clinical trials.  4. Patients treated with bamlanivimab and casirivimab\imdevimab should continue to self-isolate and use infection control measures (e.g., wear mask, isolate, social distance, avoid sharing personal items, clean and disinfect "high touch" surfaces, and frequent handwashing) according to CDC guidelines.   5. The patient or parent/caregiver has the option to accept or refuse  bamlanivimab or casirivimab\imdevimab .  After reviewing this information with the patient, The patient agreed to proceed with receiving the bamlanimivab infusion and will be provided a copy of the Fact sheet prior to receiving the infusion.Kara [name] 03/18/2019 9:00 AM  Sx onset 2/15

## 2019-03-29 DIAGNOSIS — Z3042 Encounter for surveillance of injectable contraceptive: Secondary | ICD-10-CM | POA: Diagnosis not present

## 2019-04-07 ENCOUNTER — Ambulatory Visit (INDEPENDENT_AMBULATORY_CARE_PROVIDER_SITE_OTHER): Payer: BC Managed Care – PPO | Admitting: Psychology

## 2019-04-07 DIAGNOSIS — F330 Major depressive disorder, recurrent, mild: Secondary | ICD-10-CM | POA: Diagnosis not present

## 2019-05-10 ENCOUNTER — Ambulatory Visit (INDEPENDENT_AMBULATORY_CARE_PROVIDER_SITE_OTHER): Payer: BC Managed Care – PPO | Admitting: Physician Assistant

## 2019-05-10 ENCOUNTER — Other Ambulatory Visit: Payer: Self-pay

## 2019-05-10 ENCOUNTER — Ambulatory Visit: Payer: Self-pay

## 2019-05-10 ENCOUNTER — Encounter: Payer: Self-pay | Admitting: Physician Assistant

## 2019-05-10 VITALS — Ht 66.0 in | Wt 309.0 lb

## 2019-05-10 DIAGNOSIS — M25552 Pain in left hip: Secondary | ICD-10-CM

## 2019-05-10 NOTE — Progress Notes (Signed)
Office Visit Note   Patient: Kara [name]           Date of Birth: [birth year]           MRN: [id number] Visit Date: 05/10/2019              Requested by: Marin Olp, MD [place],  [place] [address] PCP: Marin Olp, MD  Chief Complaint  Patient presents with  . Left Hip - Pain      HPI: This is a pleasant woman with a history of right knee replacement and bilateral hip arthritis.  She is being seen today because of a fall she took last Thursday.  She came down landing on her lower back and right side.  Her biggest complaint is of left groin pain radiating to her posterior buttock buttocks and down the back of her leg.  She denies any pain in her back or paresthesias  Assessment & Plan: Visit Diagnoses:  1. Pain in left hip     Plan: Should treat things symptomatically with anti-inflammatories we would expect that she would continue to get better if not she should follow-up.  Findings consistent with aggravation of her arthritic left hip cannot rule out a nondisplaced pelvic fracture  Follow-Up Instructions: No follow-ups on file.   Ortho Exam  Patient is alert, oriented, no adenopathy, well-dressed, normal affect, normal respiratory effort. Focused examination bilateral hips almost no internal or external rotation on the left hip she does not really have any significant pain on internal or external rotation and range of motion is quite limited.  No pain in the lower back negative straight leg raise  Imaging: No results found. No images are attached to the encounter.  Labs: Lab Results  Component Value Date   HGBA1C 5.4 05/18/2013   HGBA1C 5.6 06/13/2011   REPTSTATUS 05/03/2012 FINAL 04/30/2012   CULT  04/30/2012    NO STAPHYLOCOCCUS AUREUS ISOLATED Note: No MRSA Isolated     Lab Results  Component Value Date   ALBUMIN 4.4 03/03/2019   ALBUMIN 4.2 10/28/2018   ALBUMIN 3.8 03/31/2017    No results found for: MG Lab Results    Component Value Date   VD25OH 32.06 03/03/2019   VD25OH 40.30 10/28/2018   VD25OH 50.27 03/31/2017    No results found for: PREALBUMIN CBC EXTENDED Latest Ref Rng & Units 03/03/2019 10/28/2018 03/31/2017  WBC 4.0 - 10.5 K/uL 8.9 7.0 6.7  RBC 3.87 - 5.11 Mil/uL 4.78 4.80 4.70  HGB 12.0 - 15.0 g/dL 14.2 14.0 13.6  HCT 36.0 - 46.0 % 42.3 41.8 40.9  PLT 150.0 - 400.0 K/uL 352.0 338.0 342.0  NEUTROABS 1.4 - 7.7 K/uL 5.2 - 3.6  LYMPHSABS 0.7 - 4.0 K/uL 2.0 - 2.0     Body mass index is 49.87 kg/m.  Orders:  Orders Placed This Encounter  Procedures  . XR HIP UNILAT W OR W/O PELVIS 2-3 VIEWS LEFT   No orders of the defined types were placed in this encounter.    Procedures: No procedures performed  Clinical Data: No additional findings.  ROS:  All other systems negative, except as noted in the HPI. Review of Systems  Objective: Vital Signs: Ht 5' 6"  (1.676 m)   Wt (!) 309 lb (140.2 kg)   BMI 49.87 kg/m   Specialty Comments:  No specialty comments available.  PMFS History: Patient Active Problem List   Diagnosis Date Noted  . Rotator cuff syndrome of right shoulder  02/27/2018  . Restless legs syndrome 04/24/2017  . 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 Allergic rhinitis 02/03/2014  . Vitamin D deficiency 02/03/2014  . BPPV (benign paroxysmal positional vertigo) 04/19/2013  . Osteoarthritis of both knees 04/19/2013  . 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 Depression 02/22/2010  . DJD (degenerative joint disease) 10/26/2009  . OBESITY 06/16/2008  . Hyperlipidemia 06/11/2007  . Hypothyroidism 08/18/2006   Past Medical History:  Diagnosis Date  . ANXIETY 08/18/2006  . 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 DEPRESSION 02/22/2010  . GERD (gastroesophageal reflux disease)    history of  . HYPERLIPIDEMIA 06/11/2007  . HYPOTHYROIDISM 08/18/2006  . OBESITY 06/16/2008  . Pneumonia    hx of   . PONV (postoperative nausea and vomiting)     used Scopolamine patch    Family History  Problem Relation Age of Onset  . Schizophrenia Mother   . Cancer Mother        lung, smoker  . Breast cancer Mother 78  . Uterine cancer Mother   . Heart disease Father 74       smoker at time    Past Surgical History:  Procedure Laterality Date  . CHOLECYSTECTOMY N/A 05/08/2012   Procedure: LAPAROSCOPIC CHOLECYSTECTOMY WITH INTRAOPERATIVE CHOLANGIOGRAM;  Surgeon: Leighton Ruff, MD;  Location: [facility];  Service: General;  Laterality: N/A;  . FRACTURE SURGERY    . GASTRIC ROUX-EN-Y  12/29/2012   Procedure: LAPAROSCOPIC ROUX-EN-Y GASTRIC BYPASS WITH UPPER ENDOSCOPY;  Surgeon: Madilyn Hook, DO;  Location: [facility];  Service: General;;  . HARDWARE REMOVAL Right 06/14/2015   Procedure: Removal Internal Fixation Right Lateral Tibial Plateau;  Surgeon: [name], MD;  Location: [facility];  Service: Orthopedics;  Laterality: Right;  . HERNIA REPAIR    . KNEE ARTHROSCOPY Right 07/28/12   dr. Sharol Given  . ORIF TIBIA PLATEAU Right 03/10/2015   Procedure: OPEN REDUCTION INTERNAL FIXATION (ORIF) LATERAL TIBIAL PLATEAU;  Surgeon: [name], MD;  Location: [facility];  Service: Orthopedics;  Laterality: Right;  . ORIF ULNAR FRACTURE Right 1995   plates and pins  . TOTAL KNEE ARTHROPLASTY Right 06/14/2015  . TOTAL KNEE ARTHROPLASTY Right 06/14/2015   Procedure: RIGHT TOTAL KNEE ARTHROPLASTY;  Surgeon: [name], MD;  Location: [facility];  Service: Orthopedics;  Laterality: Right;  . UPPER GI ENDOSCOPY  12/29/2012   Procedure: UPPER GI ENDOSCOPY;  Surgeon: Madilyn Hook, DO;  Location: [facility];  Service: General;;   Social History   Occupational History  . Not on file  Tobacco Use  . Smoking status: Former Smoker    Types: Cigarettes    Quit date: 01/28/2005    Years since quitting: 14.2  . Smokeless tobacco: Never Used  Substance and Sexual Activity  . Alcohol use: No    Comment: not since gastric bypass  . Drug use: No  . Sexual activity: Not on file

## 2019-05-19 ENCOUNTER — Ambulatory Visit (INDEPENDENT_AMBULATORY_CARE_PROVIDER_SITE_OTHER): Payer: BC Managed Care – PPO | Admitting: Psychology

## 2019-05-19 DIAGNOSIS — F330 Major depressive disorder, recurrent, mild: Secondary | ICD-10-CM

## 2019-06-10 ENCOUNTER — Other Ambulatory Visit: Payer: Self-pay

## 2019-06-10 ENCOUNTER — Ambulatory Visit (INDEPENDENT_AMBULATORY_CARE_PROVIDER_SITE_OTHER): Payer: BC Managed Care – PPO | Admitting: Family Medicine

## 2019-06-10 ENCOUNTER — Encounter: Payer: Self-pay | Admitting: Family Medicine

## 2019-06-10 VITALS — BP 122/88 | HR 73 | Temp 98.1°F | Ht 66.0 in | Wt 301.6 lb

## 2019-06-10 DIAGNOSIS — E785 Hyperlipidemia, unspecified: Secondary | ICD-10-CM | POA: Diagnosis not present

## 2019-06-10 DIAGNOSIS — E039 Hypothyroidism, unspecified: Secondary | ICD-10-CM | POA: Diagnosis not present

## 2019-06-10 DIAGNOSIS — M170 Bilateral primary osteoarthritis of knee: Secondary | ICD-10-CM

## 2019-06-10 DIAGNOSIS — F324 Major depressive disorder, single episode, in partial remission: Secondary | ICD-10-CM | POA: Diagnosis not present

## 2019-06-10 DIAGNOSIS — G2581 Restless legs syndrome: Secondary | ICD-10-CM

## 2019-06-10 DIAGNOSIS — E559 Vitamin D deficiency, unspecified: Secondary | ICD-10-CM | POA: Diagnosis not present

## 2019-06-10 LAB — TSH: TSH: 1.46 u[IU]/mL (ref 0.35–4.50)

## 2019-06-10 MED ORDER — TRAMADOL HCL 50 MG PO TABS: ORAL_TABLET | ORAL | 5 refills | Status: DC

## 2019-06-10 NOTE — Patient Instructions (Addendum)
No changes today- update bloodwork next visit.   Glad you recovered from Covid but sorry it was a slow process!!!  Please stop by lab before you go If you have mychart- we will send your results within 3 business days of [name] receiving them.  If you do not have mychart- we will call you about results within 5 business days of [name] receiving them.   Return in about 5 months (around 11/10/2019) for follow up- or sooner if needed.

## 2019-06-10 NOTE — Progress Notes (Signed)
Phone [phone] In person visit   Subjective:   Kara [name] is a 44 y.o. year old very pleasant female patient who presents for/with See problem oriented charting Chief Complaint  Patient presents with  . Hypothyroidism  . Hyperlipidemia  . Depression    This visit occurred during the SARS-CoV-2 public health emergency.  Safety protocols were in place, including screening questions prior to the visit, additional usage of staff PPE, and extensive cleaning of exam room while observing appropriate contact time as indicated for disinfecting solutions.   Past Medical History-  Patient Active Problem List   Diagnosis Date Noted  . 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 Restless legs syndrome 04/24/2017    Priority: Medium  . Osteoarthritis of both knees 04/19/2013    Priority: Medium  . Depression 02/22/2010    Priority: Medium  . OBESITY 06/16/2008    Priority: Medium  . Hyperlipidemia 06/11/2007    Priority: Medium  . Hypothyroidism 08/18/2006    Priority: Medium  . Rotator cuff syndrome of right shoulder 02/27/2018    Priority: Low  . Allergic rhinitis 02/03/2014    Priority: Low  . Vitamin D deficiency 02/03/2014    Priority: Low  . BPPV (benign paroxysmal positional vertigo) 04/19/2013    Priority: Low  . DJD (degenerative joint disease) 10/26/2009    Priority: Low    Medications- reviewed and updated Current Outpatient Medications  Medication Sig Dispense Refill  . acetaminophen (TYLENOL) 325 MG tablet Take 650 mg by mouth 3 (three) times daily.     . calcium carbonate (OS-CAL - DOSED IN MG OF ELEMENTAL CALCIUM) 1250 (500 Ca) MG tablet Take 1 tablet by mouth 3 (three) times daily with meals.    Marland Kitchen Carbonyl Iron (IRON CHEWS PEDIATRIC PO) Take 1 tablet by mouth daily.    . diazepam (VALIUM) 5 MG tablet Take 1 tablet (5 mg total) by mouth every 12 (twelve) hours as needed  (dizziness). 20 tablet 0  . gabapentin (NEURONTIN) 300 MG capsule Take 2 capsules (600 mg total) by mouth at bedtime. 180 capsule 3  . glucosamine-chondroitin 500-400 MG tablet Take 1 tablet by mouth daily.    Marland Kitchen levocetirizine (XYZAL ALLERGY 24HR) 5 MG tablet Take 5 mg by mouth every evening.    Marland Kitchen levothyroxine (SYNTHROID) 125 MCG tablet TAKE 2 TABLETS BY MOUTH ON MONDAY, WEDNESDAY. TAKE 1 TABLET BY MOUTH the other 5 days of the week 120 tablet 2  . medroxyPROGESTERone (DEPO-PROVERA) 150 MG/ML injection Inject 150 mg into the muscle every 3 (three) months. Last dose was 12/10/12    . Melatonin 10 MG TABS Take 10 mg by mouth at bedtime.    . Multiple Vitamins-Minerals (BARIATRIC MULTIVITAMINS/IRON PO) Take 1 tablet by mouth 2 (two) times daily.    . Omeprazole 20 MG TBDD     . ondansetron (ZOFRAN ODT) 4 MG disintegrating tablet Take 1 tablet (4 mg total) by mouth every 8 (eight) hours as needed for nausea or vomiting. 20 tablet 0  . traMADol (ULTRAM) 50 MG tablet TAKE 1 TABLET(50 MG) BY MOUTH THREE TIMES DAILY AS NEEDED 90 tablet 5  . venlafaxine XR (EFFEXOR-XR) 150 MG 24 hr capsule Take 1 capsule (150 mg total) by mouth daily with breakfast. 90 capsule 3  . vitamin B-12 (CYANOCOBALAMIN) 500 MCG tablet Take 500 mcg by mouth daily.     No current facility-administered medications  for this visit.     Objective:  BP 122/88 (BP Location: Left Arm, Patient Position: Sitting, Cuff Size: Large)   Pulse 73   Temp 98.1 F (36.7 C) (Temporal)   Ht 5' 6"  (1.676 m)   Wt (!) 301 lb 9.6 oz (136.8 kg)   SpO2 93%   BMI 48.68 kg/m  Gen: NAD, resting comfortably CV: RRR no murmurs rubs or gallops Lungs: CTAB no crackles, wheeze, rhonchi Abdomen: soft/nontender/nondistended/normal bowel sounds.  Ext: trace edema Skin: warm, dry    Assessment and Plan   #hypothyroidism S: compliant On thyroid medication-levothyroxine 125 mcg-takes 250 twice a week with recent TSH being elevated Lab Results   Component Value Date   TSH 8.86 (H) 03/03/2019  A/P:hopefully controlled-update TSH today  #hyperlipidemia S: Medication: none  Lifestyle changes: She lost some weight with covid due to not being able to taste anything- down 9 lbs. Exercise was limited by fatigue. Finally came back around 6 weeks. 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  -celexa was not effective.  - still working with [name]  Depression screen [facility] 2/9 06/10/2019 03/03/2019 12/11/2018  Decreased Interest 1 1 1   Down, Depressed, Hopeless 2 1 1   PHQ - 2 Score 3 2 2   Altered sleeping 0 0 1  Tired, decreased energy 1 1 1   Change in appetite 2 2 1   Feeling bad or failure about yourself  1 1 1   Trouble concentrating 0 0 0  Moving slowly or fidgety/restless 0 0 0  Suicidal thoughts 0 0 0  PHQ-9 Score 7 6 6   Difficult doing work/chores Not difficult at all Not difficult at all Somewhat difficult  Some recent data might be hidden  A/P: partial remission- wants to continue to current meds and continue counseling- she really thinks getting a new job would helpful  #Vitamin D deficiency S: Medication: takes MV and separate vitamin D.  Last vitamin D-trending down slightly Lab Results  Component Value Date   VD25OH 32.06  03/03/2019  A/P:  Last PTH was high- had planned to but due to covid didn't start caltrate- we will recheck PTH next visit- not sure why so high- realized today she is already on calcium carbonate 3x a day.   #OA- Knee injection February 3-patient reports got some relief but starting to worsen again- had a fall on driveway recently . Had to see Dr. Jess Barters office -continues on tramadol for the knees- usually 3x a day on bad days- twice on weekends. Will refill due to being out in June. PDMP reviewed.   #COVID-19 03/18/2019- had some prolonged fatigue but feeling better. Got outpatient antibody infusion.      # Restless legs S:gabapentin 674m at bedtime controlling symptoms.  Ferritin trending up  A/P: good control- continue current meds   #vertigo issues- intermittent- last zofran and diazepam refill about a year ago.   Recommended follow up: Return in about 5 months (around 11/10/2019) for follow up- or sooner if needed. Future Appointments  Date Time Provider [place]  06/24/2019 11:00 AM Shelor [name] L, LCSW [facility] None    Lab/Order associations:   ICD-10-CM   1. Hypothyroidism, unspecified type  E03.9 TSH  2. Hyperlipidemia, unspecified hyperlipidemia type  E78.5   3. Major depressive disorder in partial remission, unspecified whether recurrent ([place])  F32.4   4. Vitamin D deficiency  E55.9   5. Restless legs syndrome  G25.81   6. Primary osteoarthritis of both knees  M17.0     Meds ordered this encounter  Medications  . traMADol (ULTRAM) 50 MG tablet    Sig: TAKE 1 TABLET(50 MG) BY MOUTH THREE TIMES DAILY AS NEEDED    Dispense:  90 tablet    Refill:  5   Return precautions advised.  Garret Reddish, MD

## 2019-06-18 DIAGNOSIS — Z3042 Encounter for surveillance of injectable contraceptive: Secondary | ICD-10-CM | POA: Diagnosis not present

## 2019-06-18 DIAGNOSIS — Z6841 Body Mass Index (BMI) 40.0 and over, adult: Secondary | ICD-10-CM | POA: Diagnosis not present

## 2019-06-18 DIAGNOSIS — Z01419 Encounter for gynecological examination (general) (routine) without abnormal findings: Secondary | ICD-10-CM | POA: Diagnosis not present

## 2019-06-18 DIAGNOSIS — Z1231 Encounter for screening mammogram for malignant neoplasm of breast: Secondary | ICD-10-CM | POA: Diagnosis not present

## 2019-06-18 LAB — HM MAMMOGRAPHY

## 2019-06-20 ENCOUNTER — Other Ambulatory Visit: Payer: Self-pay | Admitting: Family Medicine

## 2019-06-24 ENCOUNTER — Ambulatory Visit (INDEPENDENT_AMBULATORY_CARE_PROVIDER_SITE_OTHER): Payer: BC Managed Care – PPO | Admitting: Psychology

## 2019-06-24 DIAGNOSIS — F330 Major depressive disorder, recurrent, mild: Secondary | ICD-10-CM

## 2019-06-25 ENCOUNTER — Encounter: Payer: Self-pay | Admitting: Family Medicine

## 2019-07-30 DIAGNOSIS — R87618 Other abnormal cytological findings on specimens from cervix uteri: Secondary | ICD-10-CM | POA: Diagnosis not present

## 2019-08-04 ENCOUNTER — Ambulatory Visit (INDEPENDENT_AMBULATORY_CARE_PROVIDER_SITE_OTHER): Payer: BC Managed Care – PPO | Admitting: Psychology

## 2019-08-04 DIAGNOSIS — F330 Major depressive disorder, recurrent, mild: Secondary | ICD-10-CM

## 2019-09-03 DIAGNOSIS — Z3042 Encounter for surveillance of injectable contraceptive: Secondary | ICD-10-CM | POA: Diagnosis not present

## 2019-09-16 ENCOUNTER — Ambulatory Visit (INDEPENDENT_AMBULATORY_CARE_PROVIDER_SITE_OTHER): Payer: Self-pay | Admitting: Psychology

## 2019-09-16 DIAGNOSIS — F330 Major depressive disorder, recurrent, mild: Secondary | ICD-10-CM

## 2019-10-17 ENCOUNTER — Other Ambulatory Visit: Payer: Self-pay | Admitting: Family Medicine

## 2019-11-15 ENCOUNTER — Ambulatory Visit: Payer: Self-pay | Admitting: Psychology

## 2019-11-15 ENCOUNTER — Ambulatory Visit: Payer: BC Managed Care – PPO | Admitting: Family Medicine

## 2019-11-22 DIAGNOSIS — Z3042 Encounter for surveillance of injectable contraceptive: Secondary | ICD-10-CM | POA: Diagnosis not present

## 2019-11-26 NOTE — Progress Notes (Signed)
Phone [phone] In person visit   Subjective:   Kara [name] is a 44 y.o. year old very pleasant female patient who presents for/with See problem oriented charting Chief Complaint  Patient presents with  . Hyperlipidemia  . Hypothyroidism    This visit occurred during the SARS-CoV-2 public health emergency.  Safety protocols were in place, including screening questions prior to the visit, additional usage of staff PPE, and extensive cleaning of exam room while observing appropriate contact time as indicated for disinfecting solutions.   Past Medical History-  Patient Active Problem List   Diagnosis Date Noted  . 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 Restless legs syndrome 04/24/2017    Priority: Medium  . Osteoarthritis of both knees 04/19/2013    Priority: Medium  . Depression 02/22/2010    Priority: Medium  . OBESITY 06/16/2008    Priority: Medium  . Hyperlipidemia 06/11/2007    Priority: Medium  . Hypothyroidism 08/18/2006    Priority: Medium  . Rotator cuff syndrome of right shoulder 02/27/2018    Priority: Low  . Allergic rhinitis 02/03/2014    Priority: Low  . Vitamin D deficiency 02/03/2014    Priority: Low  . BPPV (benign paroxysmal positional vertigo) 04/19/2013    Priority: Low  . DJD (degenerative joint disease) 10/26/2009    Priority: Low    Medications- reviewed and updated Current Outpatient Medications  Medication Sig Dispense Refill  . acetaminophen (TYLENOL) 325 MG tablet Take 650 mg by mouth 3 (three) times daily.     . calcium carbonate (OS-CAL - DOSED IN MG OF ELEMENTAL CALCIUM) 1250 (500 Ca) MG tablet Take 1 tablet by mouth 3 (three) times daily with meals.    Marland Kitchen Carbonyl Iron (IRON CHEWS PEDIATRIC PO) Take 1 tablet by mouth daily.    . diazepam (VALIUM) 5 MG tablet Take 1 tablet (5 mg total) by mouth every 12 (twelve) hours as needed (dizziness). 20  tablet 0  . gabapentin (NEURONTIN) 300 MG capsule TAKE 2 CAPSULES(600 MG) BY MOUTH AT BEDTIME 180 capsule 3  . glucosamine-chondroitin 500-400 MG tablet Take 1 tablet by mouth daily.    Marland Kitchen levocetirizine (XYZAL ALLERGY 24HR) 5 MG tablet Take 5 mg by mouth every evening.    Marland Kitchen levothyroxine (SYNTHROID) 125 MCG tablet TAKE 2 TABLETS BY MOUTH ON MONDAY, WEDNESDAY. TAKE 1 TABLET BY MOUTH the other 5 days of the week (Patient taking differently: TAKE 2 TABLETS BY MOUTH ON MONDAY, THURSDAY. TAKE 1 TABLET BY MOUTH the other 5 days of the week) 120 tablet 2  . medroxyPROGESTERone (DEPO-PROVERA) 150 MG/ML injection Inject 150 mg into the muscle every 3 (three) months. Last dose was 12/10/12    . Melatonin 10 MG TABS Take 10 mg by mouth at bedtime.    . Multiple Vitamins-Minerals (BARIATRIC MULTIVITAMINS/IRON PO) Take 1 tablet by mouth 2 (two) times daily.    . Omeprazole 20 MG TBDD     . ondansetron (ZOFRAN ODT) 4 MG disintegrating tablet Take 1 tablet (4 mg total) by mouth every 8 (eight) hours as needed for nausea or vomiting. 20 tablet 0  . traMADol (ULTRAM) 50 MG tablet TAKE 1 TABLET(50 MG) BY MOUTH THREE TIMES DAILY AS NEEDED 90 tablet 5  . venlafaxine XR (EFFEXOR-XR) 150 MG 24 hr capsule TAKE 1 CAPSULE(150 MG) BY MOUTH DAILY WITH BREAKFAST 90 capsule 3  . vitamin B-12 (CYANOCOBALAMIN) 500  MCG tablet Take 500 mcg by mouth daily.     No current facility-administered medications for this visit.     Objective:  BP 130/68   Pulse 86   Temp 98.6 F (37 C) (Temporal)   Ht 5' 6"  (1.676 m)   Wt (!) 308 lb 6.4 oz (139.9 kg)   SpO2 99%   BMI 49.78 kg/m  Gen: NAD, resting comfortably CV: RRR no murmurs rubs or gallops Lungs: CTAB no crackles, wheeze, rhonchi MSK: significant left medial joint line pain, some lateral pain as well. Antalgic gait.  Skin: warm, dry  Verbal consent obtained and verified for Left Knee injection Sterile betadine prep. Furthur cleansed with alcohol. Topical analgesic  spray: Ethyl chloride. Joint: Left knee Approached in typical fashion with: anteromedial approach Completed without difficulty Meds: 3 cc lidocaine without epinephrine and 1 cc of depo medrol 80 mg/cc Needle:1 1/2 inch 25 gauge needle Aftercare instructions and Red flags advised.     Assessment and Plan  #Left knee osteoarthritis knee Injection S:Patient mentioned that her left knee has been bothering her since mid-august. 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  -nsaid snot ideal with gastric bypass history. We use tramadol and tylenol combo. aspercreme works better than voltaren.   Last knee injection was February 3.   A/P:  Left Knee OA worsening despite ongoing tramadol and tylenol and aspercreme. She is at the point she wants to do another injection- did get 8-9 months relief so I think that's reasonable. Se enote above. Easier for her with her schedule to go ahead and do this today than scheduling with Dr. Sharol Given- they didn't have openings this week - She had immediate improvement from 7/10 to 3-4/10 after injection.  - continue tramadol as needed for pain- if injections become more frequent may need to consider left knee replacment/follow up with Dr. Sharol Given  #restless legs- gabapentin still helpful. Continue current meds  # Depression S: Medication:Effexor 150 mg extended release. Thinks change in job has really helped her.  -Celexa was not effective.  Working with [name] with behavioral health Depression screen [facility] 2/9 11/30/2019 06/10/2019 03/03/2019  Decreased Interest 0 1 1  Down, Depressed, Hopeless 0 2 1  PHQ - 2 Score 0 3 2  Altered sleeping 2 0 0  Tired, decreased energy 0 1 1  Change in appetite 1 2 2   Feeling bad or failure about yourself  0 1 1  Trouble concentrating 0 0 0  Moving slowly or fidgety/restless 0 0 0  Suicidal thoughts 0 0 0  PHQ-9 Score 3 7 6   Difficult doing  work/chores - Not difficult at all Not difficult at all  Some recent data might be hidden  A/P: depression better. Still some sleep issues- recommended trialing lower dose like 1-3 mg of melatonin instead of 10  #hypothyroidism S: compliant On thyroid medication- Synthroid 160mg x2. Two on Monday and Thursday and then 1 rest of the week.  Lab Results  Component Value Date   TSH 1.46 06/10/2019   A/P:stable on last check- continue current medicine   #hyperlipidemia/obesity S: Medication:none lifestlye- started at gym back in august. Change in job has been really good for her. Eating more normally.   Lab Results  Component Value Date   CHOL 213 (H) 03/03/2019   HDL 52.00 03/03/2019   LDLCALC 144 (H) 03/03/2019  LDLDIRECT 96.0 05/23/2014   TRIG 87.0 03/03/2019   CHOLHDL 4 03/03/2019   A/P: right now we want to focus on lifestyle with 10 year ascvd risk only being 1%. We discussed making dietary changes- wants to make better choices in her food choices/snacking in particular  #Vitamin D deficiency/5 parathyroid hormone S: Medication:  Taking calcium 800 mg per day and vitamin D now 1000 units . Prior PTH was high but hadnt started caltrate yet at that time.  Last vitamin D Lab Results  Component Value Date   VD25OH 32.06 03/03/2019  A/P: recheck PTH and vitamin D today - hoping levels look better  # history of covid 19 in February- fatigue continues to improve. Also has been immunized. She is leaning toward doing the booster.  Immunization History  Administered Date(s) Administered  . Influenza Split 11/08/2010  . Influenza Whole 10/29/2006  . Influenza,inj,Quad PF,6+ Mos 10/20/2012, 11/11/2013, 10/10/2014, 03/26/2017, 01/26/2018, 10/28/2018  . PFIZER SARS-COV-2 Vaccination 04/13/2019, 05/11/2019  . Td 12/18/2006  . Tdap 04/24/2017   # still with incontinence issues- pending referral from GYN. Not having much success with Kegel exercises . myrbetriq didn't help  Recommended  follow up: Return in about 6 months (around 05/29/2020) for physical or sooner if needed. Future Appointments  Date Time Provider [place]  12/02/2019 12:00 PM Shelor [name] L, LCSW [facility] None    Lab/Order associations:   ICD-10-CM   1. Hypothyroidism, unspecified type  E03.9 TSH    COMPLETE METABOLIC PANEL WITH GFR  2. Major depressive disorder in partial remission, unspecified whether recurrent ([place])  F32.4   3. Hyperlipidemia, unspecified hyperlipidemia type  V67.2 COMPLETE METABOLIC PANEL WITH GFR  4. Vitamin D deficiency  E55.9 VITAMIN D 25 Hydroxy (Vit-D Deficiency, Fractures)  5. Encounter for hepatitis C screening test for low risk patient  Z11.59 Hepatitis C antibody  6. High serum parathyroid hormone (PTH)  R79.89 PTH, intact (no Ca)  7. Primary osteoarthritis of left knee  M17.12     No orders of the defined types were placed in this encounter.  Return precautions advised.  Garret Reddish, MD

## 2019-11-26 NOTE — Patient Instructions (Addendum)
Health Maintenance Due  Topic Date Due  . Hepatitis C Screening - with bloodwork Never done  . INFLUENZA VACCINE In office flu shot today. 08/29/2019   Please stop by lab before you go If you have mychart- we will send your results within 3 business days of [name] receiving them.  If you do not have mychart- we will call you about results within 5 business days of [name] receiving them.  *please note we are currently using [facility] which has a longer processing time than [place] typically so labs may not come back as quickly as in the past *please also note that you will see labs on mychart as soon as they post. I will later go in and write notes on them- will say "notes from Dr. Yong Channel"  You had an injection today.  Things to be aware of after injection are listed below: . You may experience no significant improvement or even a slight worsening in your symptoms during the first 24 to 48 hours.  After that we expect your symptoms to improve gradually over the next 2 weeks for the medicine to have its maximal effect.  You should continue to have improvement out to 6 weeks after your injection. . Dr. Yong Channel recommends icing the site of the injection for 20 minutes at least 2 times the day of your injection . You may shower but no swimming, tub bath or Jacuzzi for 24 hours. . If your bandage falls off this does not need to be replaced.  It is appropriate to remove the bandage after 4 hours. . You may resume light activities as tolerated unless otherwise directed per Dr. Yong Channel during your visit  POSSIBLE STEROID SIDE EFFECTS:  Side effects from injectable steroids tend to be less than when taken orally however you may experience some of the symptoms listed below.  If experienced these should only last for a short period of time. Change in menstrual flow  Edema (swelling)  Increased appetite Skin flushing (redness)  Skin rash/acne  Thrush (oral) Yeast vaginitis    Increased  sweating  Depression Increased blood glucose levels Cramping and leg/calf  Euphoria (feeling happy)  POSSIBLE PROCEDURE SIDE EFFECTS: The side effects of the injection are usually fairly minimal however if you may experience some of the following side effects that are usually self-limited and will is off on their own.  If you are concerned please feel free to call the office with questions:  Increased numbness or tingling  Nausea or vomiting  Swelling or bruising at the injection site   Please call our office if if you experience any of the following symptoms over the next week as these can be signs of infection:   Fever greater than 100.81F  Significant swelling at the injection site  Significant redness or drainage from the injection site  If after 2 weeks you are continuing to have worsening symptoms please call our office to discuss what the next appropriate actions should be including the potential for a return office visit or other diagnostic testing.

## 2019-11-30 ENCOUNTER — Encounter: Payer: Self-pay | Admitting: Family Medicine

## 2019-11-30 ENCOUNTER — Ambulatory Visit (INDEPENDENT_AMBULATORY_CARE_PROVIDER_SITE_OTHER): Payer: BC Managed Care – PPO | Admitting: Family Medicine

## 2019-11-30 ENCOUNTER — Other Ambulatory Visit: Payer: Self-pay

## 2019-11-30 VITALS — BP 130/68 | HR 86 | Temp 98.6°F | Ht 66.0 in | Wt 308.4 lb

## 2019-11-30 DIAGNOSIS — E039 Hypothyroidism, unspecified: Secondary | ICD-10-CM | POA: Diagnosis not present

## 2019-11-30 DIAGNOSIS — E785 Hyperlipidemia, unspecified: Secondary | ICD-10-CM

## 2019-11-30 DIAGNOSIS — R7989 Other specified abnormal findings of blood chemistry: Secondary | ICD-10-CM | POA: Diagnosis not present

## 2019-11-30 DIAGNOSIS — E559 Vitamin D deficiency, unspecified: Secondary | ICD-10-CM

## 2019-11-30 DIAGNOSIS — F324 Major depressive disorder, single episode, in partial remission: Secondary | ICD-10-CM | POA: Diagnosis not present

## 2019-11-30 DIAGNOSIS — Z23 Encounter for immunization: Secondary | ICD-10-CM | POA: Diagnosis not present

## 2019-11-30 DIAGNOSIS — Z1159 Encounter for screening for other viral diseases: Secondary | ICD-10-CM | POA: Diagnosis not present

## 2019-11-30 DIAGNOSIS — M1712 Unilateral primary osteoarthritis, left knee: Secondary | ICD-10-CM

## 2019-11-30 MED ORDER — LEVOTHYROXINE SODIUM 125 MCG PO TABS: ORAL_TABLET | ORAL | 3 refills | Status: DC

## 2019-12-01 ENCOUNTER — Other Ambulatory Visit: Payer: Self-pay | Admitting: Family Medicine

## 2019-12-01 LAB — COMPLETE METABOLIC PANEL WITH GFR
AG Ratio: 1.6 (calc) (ref 1.0–2.5)
ALT: 17 U/L (ref 6–29)
AST: 17 U/L (ref 10–35)
Albumin: 4.2 g/dL (ref 3.6–5.1)
Alkaline phosphatase (APISO): 83 U/L (ref 31–125)
BUN: 10 mg/dL (ref 7–25)
CO2: 22 mmol/L (ref 20–32)
Calcium: 9.3 mg/dL (ref 8.6–10.2)
Chloride: 107 mmol/L (ref 98–110)
Creat: 0.71 mg/dL (ref 0.50–1.10)
GFR, Est African American: 118 mL/min/{1.73_m2} (ref >=60)
GFR, Est Non African American: 102 mL/min/{1.73_m2} (ref >=60)
Globulin: 2.6 g/dL (calc) (ref 1.9–3.7)
Glucose, Bld: 74 mg/dL (ref 65–99)
Potassium: 4.8 mmol/L (ref 3.5–5.3)
Sodium: 140 mmol/L (ref 135–146)
Total Bilirubin: 0.6 mg/dL (ref 0.2–1.2)
Total Protein: 6.8 g/dL (ref 6.1–8.1)

## 2019-12-01 LAB — PARATHYROID HORMONE, INTACT (NO CA): PTH: 75 pg/mL — ABNORMAL HIGH (ref 14–64)

## 2019-12-01 LAB — HEPATITIS C ANTIBODY
Hepatitis C Ab: NONREACTIVE
SIGNAL TO CUT-OFF: 0.01 (ref <1.00)

## 2019-12-01 LAB — VITAMIN D 25 HYDROXY (VIT D DEFICIENCY, FRACTURES): Vit D, 25-Hydroxy: 22 ng/mL — ABNORMAL LOW (ref 30–100)

## 2019-12-01 LAB — TSH: TSH: 20.3 mIU/L — ABNORMAL HIGH

## 2019-12-01 MED ORDER — VITAMIN D (ERGOCALCIFEROL) 1.25 MG (50000 UNIT) PO CAPS: 50000.0000 [IU] | ORAL_CAPSULE | ORAL | 1 refills | Status: DC

## 2019-12-02 ENCOUNTER — Ambulatory Visit (INDEPENDENT_AMBULATORY_CARE_PROVIDER_SITE_OTHER): Payer: Self-pay | Admitting: Psychology

## 2019-12-02 ENCOUNTER — Other Ambulatory Visit: Payer: Self-pay

## 2019-12-02 DIAGNOSIS — E039 Hypothyroidism, unspecified: Secondary | ICD-10-CM

## 2019-12-02 DIAGNOSIS — F330 Major depressive disorder, recurrent, mild: Secondary | ICD-10-CM

## 2019-12-03 ENCOUNTER — Other Ambulatory Visit: Payer: Self-pay

## 2019-12-03 ENCOUNTER — Encounter: Payer: Self-pay | Admitting: Family Medicine

## 2019-12-03 MED ORDER — LEVOTHYROXINE SODIUM 125 MCG PO TABS: ORAL_TABLET | ORAL | 3 refills | Status: DC

## 2019-12-06 MED ORDER — METHYLPREDNISOLONE ACETATE 80 MG/ML IJ SUSP
80.0000 mg | Freq: Once | INTRAMUSCULAR | Status: AC
  Administered 2019-12-06: 80 mg via INTRA_ARTICULAR

## 2019-12-06 NOTE — Addendum Note (Signed)
Addended byHildred Alamin on: 12/06/2019 09:35 AM   Modules accepted: Orders

## 2019-12-19 ENCOUNTER — Other Ambulatory Visit: Payer: Self-pay | Admitting: Family Medicine

## 2019-12-20 NOTE — Telephone Encounter (Signed)
LR: 06-10-2019 Qty: 90 with 5 refills  Last office visit: 11-30-2019 Upcoming appointment: 06-02-2020

## 2020-01-13 ENCOUNTER — Ambulatory Visit (INDEPENDENT_AMBULATORY_CARE_PROVIDER_SITE_OTHER): Payer: BC Managed Care – PPO | Admitting: Psychology

## 2020-01-13 DIAGNOSIS — F330 Major depressive disorder, recurrent, mild: Secondary | ICD-10-CM

## 2020-01-14 ENCOUNTER — Other Ambulatory Visit: Payer: BC Managed Care – PPO

## 2020-01-14 DIAGNOSIS — E039 Hypothyroidism, unspecified: Secondary | ICD-10-CM | POA: Diagnosis not present

## 2020-01-15 ENCOUNTER — Encounter: Payer: Self-pay | Admitting: Family Medicine

## 2020-01-15 LAB — TSH: TSH: 7.77 mIU/L — ABNORMAL HIGH

## 2020-01-18 ENCOUNTER — Other Ambulatory Visit: Payer: Self-pay

## 2020-01-18 DIAGNOSIS — E039 Hypothyroidism, unspecified: Secondary | ICD-10-CM

## 2020-01-18 NOTE — Telephone Encounter (Signed)
Lvm for pt to schedule lab visit for first week in Feb. 2022

## 2020-02-23 ENCOUNTER — Ambulatory Visit: Payer: BC Managed Care – PPO | Admitting: Psychology

## 2020-03-01 ENCOUNTER — Other Ambulatory Visit: Payer: Self-pay

## 2020-03-01 ENCOUNTER — Other Ambulatory Visit (INDEPENDENT_AMBULATORY_CARE_PROVIDER_SITE_OTHER): Payer: BC Managed Care – PPO

## 2020-03-01 DIAGNOSIS — E039 Hypothyroidism, unspecified: Secondary | ICD-10-CM

## 2020-03-01 LAB — TSH: TSH: 3.73 u[IU]/mL (ref 0.35–4.50)

## 2020-03-01 NOTE — Addendum Note (Signed)
Addended by: Adah Salvage F on: 03/01/2020 08:18 AM   Modules accepted: Orders

## 2020-03-21 ENCOUNTER — Encounter: Payer: Self-pay | Admitting: Family Medicine

## 2020-03-22 ENCOUNTER — Ambulatory Visit: Payer: BC Managed Care – PPO | Admitting: Psychology

## 2020-04-19 ENCOUNTER — Ambulatory Visit: Payer: BC Managed Care – PPO | Admitting: Psychology

## 2020-04-24 DIAGNOSIS — Z3042 Encounter for surveillance of injectable contraceptive: Secondary | ICD-10-CM | POA: Diagnosis not present

## 2020-05-02 ENCOUNTER — Other Ambulatory Visit: Payer: Self-pay

## 2020-05-02 ENCOUNTER — Encounter: Payer: Self-pay | Admitting: Family

## 2020-05-02 ENCOUNTER — Telehealth (INDEPENDENT_AMBULATORY_CARE_PROVIDER_SITE_OTHER): Payer: BC Managed Care – PPO | Admitting: Family

## 2020-05-02 VITALS — Ht 66.0 in | Wt 308.4 lb

## 2020-05-02 DIAGNOSIS — Z9884 Bariatric surgery status: Secondary | ICD-10-CM

## 2020-05-02 DIAGNOSIS — K219 Gastro-esophageal reflux disease without esophagitis: Secondary | ICD-10-CM | POA: Diagnosis not present

## 2020-05-02 DIAGNOSIS — R112 Nausea with vomiting, unspecified: Secondary | ICD-10-CM | POA: Diagnosis not present

## 2020-05-02 MED ORDER — OMEPRAZOLE 40 MG PO CPDR: 40.0000 mg | DELAYED_RELEASE_CAPSULE | Freq: Every day | ORAL | 3 refills | Status: AC

## 2020-05-02 MED ORDER — ONDANSETRON 4 MG PO TBDP: 4.0000 mg | ORAL_TABLET | Freq: Three times a day (TID) | ORAL | 0 refills | Status: DC | PRN

## 2020-05-02 NOTE — Progress Notes (Signed)
Virtual Visit via Video   I connected with patient on 05/02/20 at  4:00 PM EDT by a video enabled telemedicine application and verified that I am speaking with the correct person using two identifiers.  Location patient: Home Location provider: [employer], Office Persons participating in the virtual visit: Kara [name]/Kara [name]  I discussed the limitations of evaluation and management by telemedicine and the availability of in person appointments. The patient expressed understanding and agreed to proceed.  Subjective:   HPI:   45 year old female with a history of gastric bypass and cholecystectomy presents with concerns of nausea vomiting x5 days.  Nausea is worse after eating.  Reports eating 2 cycles on Sunday and vomited.  She has had a decreased appetite.  Has had an increase in heartburn and indigestion despite taking omeprazole 20 mg daily.  Denies any diarrhea but admits to decreased defecation over the last 3 days.  However, her food intake has been less.  Admits to right upper quadrant abdominal pain-mild.  She has been taken Tums that have not helped much.  Denies any increase in stress.  ROS:   See pertinent positives and negatives per HPI.  Patient Active Problem List   Diagnosis Date Noted  . Rotator cuff syndrome of right shoulder 02/27/2018  . Restless legs syndrome 04/24/2017  . 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 Allergic rhinitis 02/03/2014  . Vitamin D deficiency 02/03/2014  . BPPV (benign paroxysmal positional vertigo) 04/19/2013  . Osteoarthritis of both knees 04/19/2013  . 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 Depression 02/22/2010  . DJD (degenerative joint disease) 10/26/2009  . OBESITY 06/16/2008  . Hyperlipidemia 06/11/2007  . Hypothyroidism 08/18/2006    Social History   Tobacco Use  . Smoking status: Former Smoker    Types: Cigarettes    Quit date: 01/28/2005    Years since quitting: 15.2  . Smokeless  tobacco: Never Used  Substance Use Topics  . Alcohol use: No    Comment: not since gastric bypass    Current Outpatient Medications:  .  acetaminophen (TYLENOL) 325 MG tablet, Take 650 mg by mouth 3 (three) times daily., Disp: , Rfl:  .  calcium carbonate (OS-CAL - DOSED IN MG OF ELEMENTAL CALCIUM) 1250 (500 Ca) MG tablet, Take 1 tablet by mouth 3 (three) times daily with meals., Disp: , Rfl:  .  Carbonyl Iron (IRON CHEWS PEDIATRIC PO), Take 1 tablet by mouth daily., Disp: , Rfl:  .  cholecalciferol (VITAMIN D3) 25 MCG (1000 UNIT) tablet, Take 2,000 Units by mouth daily., Disp: , Rfl:  .  diazepam (VALIUM) 5 MG tablet, Take 1 tablet (5 mg total) by mouth every 12 (twelve) hours as needed (dizziness)., Disp: 20 tablet, Rfl: 0 .  gabapentin (NEURONTIN) 300 MG capsule, TAKE 2 CAPSULES(600 MG) BY MOUTH AT BEDTIME, Disp: 180 capsule, Rfl: 3 .  glucosamine-chondroitin 500-400 MG tablet, Take 1 tablet by mouth daily., Disp: , Rfl:  .  levocetirizine (XYZAL) 5 MG tablet, Take 5 mg by mouth every evening., Disp: , Rfl:  .  levothyroxine (SYNTHROID) 125 MCG tablet, TAKE 2 TABLETS BY MOUTH ON MONDAY, THURSDAY. TAKE 1 TABLET BY MOUTH the other 5 days of the week, Disp: 145 tablet, Rfl: 3 .  medroxyPROGESTERone (DEPO-PROVERA) 150 MG/ML injection, Inject 150 mg into the muscle every 3 (three) months. Last dose was 12/10/12, Disp: , Rfl:  .  Melatonin 10 MG TABS, Take 10 mg by mouth at bedtime., Disp: , Rfl:  .  Multiple Vitamins-Minerals (BARIATRIC MULTIVITAMINS/IRON PO), Take 1 tablet by mouth 2 (two) times daily., Disp: , Rfl:  .  omeprazole (PRILOSEC) 40 MG capsule, Take 1 capsule (40 mg total) by mouth daily., Disp: 30 capsule, Rfl: 3 .  ondansetron (ZOFRAN ODT) 4 MG disintegrating tablet, Take 1 tablet (4 mg total) by mouth every 8 (eight) hours as needed for nausea or vomiting., Disp: 20 tablet, Rfl: 0 .  traMADol (ULTRAM) 50 MG tablet, TAKE 1 TABLET(50 MG) BY MOUTH THREE TIMES DAILY AS NEEDED, Disp: 90  tablet, Rfl: 5 .  venlafaxine XR (EFFEXOR-XR) 150 MG 24 hr capsule, TAKE 1 CAPSULE(150 MG) BY MOUTH DAILY WITH BREAKFAST, Disp: 90 capsule, Rfl: 3 .  vitamin B-12 (CYANOCOBALAMIN) 500 MCG tablet, Take 500 mcg by mouth daily., Disp: , Rfl:  .  Vitamin D, Ergocalciferol, (DRISDOL) 1.25 MG (50000 UNIT) CAPS capsule, Take 1 capsule (50,000 Units total) by mouth every 7 (seven) days. (Patient not taking: Reported on 05/02/2020), Disp: 13 capsule, Rfl: 1  Allergies  Allergen Reactions  . Asa [Aspirin] Itching and Swelling    Objective:   Ht 5' 6"  (1.676 m)   Wt (!) 308 lb 6.8 oz (139.9 kg)   BMI 49.78 kg/m   Patient is well-developed, well-nourished in no acute distress.  Resting comfortably at home.  Head is normocephalic, atraumatic.  No labored breathing.  Speech is clear and coherent with logical content.  Patient is alert and oriented at baseline.    Assessment and Plan:    Kara [name] was seen today for emesis, nausea and abdominal pain.  Diagnoses and all orders for this visit:  Non-intractable vomiting with nausea, unspecified vomiting type  Gastroesophageal reflux disease, unspecified whether esophagitis present  History of gastric bypass  Morbid obesity ([place])  Other orders -     omeprazole (PRILOSEC) 40 MG capsule; Take 1 capsule (40 mg total) by mouth daily. -     ondansetron (ZOFRAN ODT) 4 MG disintegrating tablet; Take 1 tablet (4 mg total) by mouth every 8 (eight) hours as needed for nausea or vomiting.  Call the office with any questions or concerns.  Return for office visit if symptoms worsen or persist.  Criss Rosales diet advance as able to tolerate.  Consider referral to GI if symptoms persist.  Kennyth Arnold, FNP 05/02/2020

## 2020-05-03 ENCOUNTER — Telehealth: Payer: Self-pay

## 2020-05-03 NOTE — Telephone Encounter (Signed)
I returned pt's call to discuss Prilosec 40 mg refill. Pt was seen by Dutch Quint yesterday, and was prescribed 40 mg tab. She says that she could not get her refill from [facility]. I let her know that I spoke with [facility] this morning. They are saying that her insurance does not cover this medication any longer, due to it being OTC. Pt says that she will double up on the Prilosec 20 mg tab that she has at home. Pt was told to follow up with her insurance to see if another medication could be covered.

## 2020-05-03 NOTE — Telephone Encounter (Signed)
Called and spoke with pharmacist, she states they told pt to double up on Prilosec 9m since her insurance will not cover the 460mprescription.

## 2020-05-03 NOTE — Telephone Encounter (Signed)
This is rather confusing-Prilosec 20 mg over-the-counter but I am not aware of 40 mg being over-the-counter option-I would double check with pharmacy-this is the first I have ever heard of this

## 2020-05-23 ENCOUNTER — Ambulatory Visit (INDEPENDENT_AMBULATORY_CARE_PROVIDER_SITE_OTHER): Payer: BC Managed Care – PPO | Admitting: Psychology

## 2020-05-23 DIAGNOSIS — F330 Major depressive disorder, recurrent, mild: Secondary | ICD-10-CM | POA: Diagnosis not present

## 2020-06-01 NOTE — Progress Notes (Signed)
Phone [phone]   Subjective:  Patient presents today for their annual physical. Chief complaint-noted.   See problem oriented charting- ROS- full  review of systems was completed and negative except for: allergies, urinary frequency and urgency, left knee pain  The following were reviewed and entered/updated in epic: Past Medical History:  Diagnosis Date  . ANXIETY 08/18/2006  . 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 DEPRESSION 02/22/2010  . GERD (gastroesophageal reflux disease)    history of  . HYPERLIPIDEMIA 06/11/2007  . HYPOTHYROIDISM 08/18/2006  . OBESITY 06/16/2008  . Pneumonia    hx of   . PONV (postoperative nausea and vomiting)    used Scopolamine patch   Patient Active Problem List   Diagnosis Date Noted  . 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 Restless legs syndrome 04/24/2017    Priority: Medium  . Osteoarthritis of both knees 04/19/2013    Priority: Medium  . Depression 02/22/2010    Priority: Medium  . Morbid obesity ([place]) 06/16/2008    Priority: Medium  . Hyperlipidemia 06/11/2007    Priority: Medium  . Hypothyroidism 08/18/2006    Priority: Medium  . Rotator cuff syndrome of right shoulder 02/27/2018    Priority: Low  . Allergic rhinitis 02/03/2014    Priority: Low  . Vitamin D deficiency 02/03/2014    Priority: Low  . BPPV (benign paroxysmal positional vertigo) 04/19/2013    Priority: Low  . DJD (degenerative joint disease) 10/26/2009    Priority: Low   Past Surgical History:  Procedure Laterality Date  . CHOLECYSTECTOMY N/A 05/08/2012   Procedure: LAPAROSCOPIC CHOLECYSTECTOMY WITH INTRAOPERATIVE CHOLANGIOGRAM;  Surgeon: Leighton Ruff, MD;  Location: [facility];  Service: General;  Laterality: N/A;  . FRACTURE SURGERY    . GASTRIC ROUX-EN-Y  12/29/2012   Procedure: LAPAROSCOPIC ROUX-EN-Y GASTRIC BYPASS WITH UPPER ENDOSCOPY;  Surgeon: Madilyn Hook, DO;  Location: [facility];  Service: General;;  . HARDWARE REMOVAL Right 06/14/2015   Procedure: Removal Internal Fixation Right Lateral Tibial Plateau;  Surgeon: [name], MD;  Location: [place];  Service: Orthopedics;  Laterality: Right;  . HERNIA REPAIR    . KNEE ARTHROSCOPY Right 07/28/12   dr. Sharol Given  . ORIF TIBIA PLATEAU Right 03/10/2015   Procedure: OPEN REDUCTION INTERNAL FIXATION (ORIF) LATERAL TIBIAL PLATEAU;  Surgeon: [name], MD;  Location: [place];  Service: Orthopedics;  Laterality: Right;  . ORIF ULNAR FRACTURE Right 1995   plates and pins  . TOTAL KNEE ARTHROPLASTY Right 06/14/2015  . TOTAL KNEE ARTHROPLASTY Right 06/14/2015   Procedure: RIGHT TOTAL KNEE ARTHROPLASTY;  Surgeon: [name], MD;  Location: [place];  Service: Orthopedics;  Laterality: Right;  . UPPER GI ENDOSCOPY  12/29/2012   Procedure: UPPER GI ENDOSCOPY;  Surgeon: Madilyn Hook, DO;  Location: [facility];  Service: General;;    Family History  Problem Relation Age of Onset  . Schizophrenia Mother   . Cancer Mother        lung, smoker  . Breast cancer Mother 22  . Uterine cancer Mother   . Heart disease Father 19       smoker at time    Medications- reviewed and updated Current Outpatient Medications  Medication Sig Dispense Refill  . acetaminophen (TYLENOL) 325 MG tablet Take 650 mg by mouth 3 (three) times daily.    . calcium carbonate (OS-CAL - DOSED IN MG  OF ELEMENTAL CALCIUM) 1250 (500 Ca) MG tablet Take 1 tablet by mouth 3 (three) times daily with meals.    Marland Kitchen Carbonyl Iron (IRON CHEWS PEDIATRIC PO) Take 1 tablet by mouth daily.    . cholecalciferol (VITAMIN D3) 25 MCG (1000 UNIT) tablet Take 2,000 Units by mouth daily.    . diazepam (VALIUM) 5 MG tablet Take 1 tablet (5 mg total) by mouth every 12 (twelve) hours as needed (dizziness). 20 tablet 0  . gabapentin (NEURONTIN) 300 MG capsule TAKE 2 CAPSULES(600 MG) BY MOUTH AT BEDTIME 180 capsule 3  . glucosamine-chondroitin 500-400 MG tablet Take  1 tablet by mouth daily.    Marland Kitchen levocetirizine (XYZAL) 5 MG tablet Take 5 mg by mouth every evening.    Marland Kitchen levothyroxine (SYNTHROID) 125 MCG tablet TAKE 2 TABLETS BY MOUTH ON MONDAY, THURSDAY. TAKE 1 TABLET BY MOUTH the other 5 days of the week 145 tablet 3  . medroxyPROGESTERone (DEPO-PROVERA) 150 MG/ML injection Inject 150 mg into the muscle every 3 (three) months. Last dose was 12/10/12    . Melatonin 10 MG TABS Take 10 mg by mouth at bedtime.    . Multiple Vitamins-Minerals (BARIATRIC MULTIVITAMINS/IRON PO) Take 1 tablet by mouth 2 (two) times daily.    Marland Kitchen omeprazole (PRILOSEC) 40 MG capsule Take 1 capsule (40 mg total) by mouth daily. 30 capsule 3  . ondansetron (ZOFRAN ODT) 4 MG disintegrating tablet Take 1 tablet (4 mg total) by mouth every 8 (eight) hours as needed for nausea or vomiting. 20 tablet 0  . terbinafine (LAMISIL) 250 MG tablet Take 1 tablet (250 mg total) by mouth daily. For toenail fungus/onychomycosis 90 tablet 0  . venlafaxine XR (EFFEXOR-XR) 150 MG 24 hr capsule TAKE 1 CAPSULE(150 MG) BY MOUTH DAILY WITH BREAKFAST 90 capsule 3  . vitamin B-12 (CYANOCOBALAMIN) 500 MCG tablet Take 500 mcg by mouth daily.    . Vitamin D, Ergocalciferol, (DRISDOL) 1.25 MG (50000 UNIT) CAPS capsule Take 1 capsule (50,000 Units total) by mouth every 7 (seven) days. 13 capsule 1  . traMADol (ULTRAM) 50 MG tablet Take 1 tablet (50 mg total) by mouth 3 (three) times daily as needed. 90 tablet 5   No current facility-administered medications for this visit.    Allergies-reviewed and updated Allergies  Allergen Reactions  . Asa [Aspirin] Itching and Swelling    Social History   Social History Narrative   Married (spouse transgender female to female). No children. Several pets-1 dog, 4 cats, 2 [place] pigs.       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       Hobbies: enjoys Luther Parody, nascar, working out   Objective  Objective:  BP  122/72   Pulse 87   Temp 98.6 F (37 C) (Temporal)   Ht 5' 6"  (1.676 m)   Wt (!) 315 lb 6.4 oz (143.1 kg)   SpO2 95%   BMI 50.91 kg/m  Gen: NAD, resting comfortably HEENT: Mucous membranes are moist. Oropharynx normal Neck: no thyromegaly or carotid bruits CV: RRR no murmurs rubs or gallops Lungs: CTAB no crackles, wheeze, rhonchi Abdomen: soft/nontender/nondistended/normal bowel sounds. No rebound or guarding. obese Ext: no edema Skin: warm, dry Neuro: grossly normal, moves all extremities, PERRLA   Assessment and Plan   45 y.o. female presenting for annual physical.  Health Maintenance counseling: 1. Anticipatory guidance: Patient counseled regarding regular dental exams -q6 months, eye exams - yearly or so,  avoiding smoking and second hand smoke , limiting alcohol to 1 beverage per day, no illicit drugs .   2. Risk factor reduction:  Advised patient of need for regular exercise and diet rich and fruits and vegetables to reduce risk of heart attack and stroke. Exercise- has cubii available doing wednesdays - has been doing HOPE program with [employer] recently- 3 days a week of exercise recently- was helping knee recover- will continue this once car situation better. Diet- weight going up even though she feels like eating reasonably healthy- offered nutrition referral- but declines for now.  Weight last CPE 309.  Morbid obesity with BMI over 40 Wt Readings from Last 3 Encounters:  06/02/20 (!) 315 lb 6.4 oz (143.1 kg)  05/02/20 (!) 308 lb 6.8 oz (139.9 kg)  11/30/19 (!) 308 lb 6.4 oz (139.9 kg)  3. Immunizations/screenings/ancillary studies- fully up-to-date  Immunization History  Administered Date(s) Administered  . Influenza Split 11/08/2010  . Influenza Whole 10/29/2006  . Influenza,inj,Quad PF,6+ Mos 10/20/2012, 11/11/2013, 10/10/2014, 03/26/2017, 01/26/2018, 10/28/2018, 11/30/2019  . PFIZER(Purple Top)SARS-COV-2 Vaccination 04/13/2019, 05/11/2019, 12/03/2019  . Td 12/18/2006  .  Tdap 04/24/2017  4. Cervical cancer screening- 05/18/2018 with GYN Dr. Corinna Capra 5. Breast cancer screening-  breast exam with GYN and mammogram 06/18/2019 6. Colon cancer screening - we discussed newer guidelines once again for colon cancer screening 7. Skin cancer screening-No dermatologist. advised regular sunscreen use. Denies worrisome, changing, or new skin lesions- stable cyst on back 8. Birth control/STD check- monogamous.  On Depo-Provera for heavy periods 9. Osteoporosis screening at 33- we will plan on this if not done by gynecology postmenopausal -Former smoker-quit 2007. gets UAs with gynecology-no regular screening required with under 20 pack years- under 10 pack years   Status of chronic or acute concerns   #hyperlipidemia S: Medication:None -ASCVD risk has been below 2%  A/P: update with labs- unlikely to start statin unless risk above 7.5%   # Depression S: Medication: Venlafaxine 153m XR  Daily -Symptoms were worse with prior employer -Celexa was not effective.  Has done therapy with behavioral health with [place]  A/P: Stable/full remission. Continue current medications.   #hypothyroidism S: compliant On thyroid medication- Levothyroxine 1256m TAKE 2 TABLETS BY MOUTH ON MONDAY, THURSDAY. TAKE 1 TABLET BY MOUTH the other 5 days of the week A/P:hopefully controlled- update TSH today   #Vitamin D deficiency/history of high PTH-improving on calcium and vitamin D S: Medication: 2000 units per day A/P: hopefully vitamin D well controlled - also hoping PTH trending down   #Left knee osteoarthritis S: Patient follows with Dr. DuSharol Given We have at times done injections for her most recently November 30, 2019. -Oral NSAIDs not ideal with gastric bypass -Uses a combination of tramadol and Tylenol.  Aspercreme has worked better for her than Voltaren gel - ups and downs lately- rain makes it worse A/P: reasonable control for now-    #Restless legs- medication: Gabapentin 600 mg at  bedtime. Reasonable control   #History of COVID-19 March 2019-patient had lingering fatigue for sometime- still lingering at this point but improving . Hope program has helped  #Incontinence- has been working with GYN.  Kegel exercises have not been helpful.  Myrbetriq did not help. She requests referral today to urology- this was placed. UTI treatment in past helped but never resolved   #nausea/vomiting from last month- unclear trigger- possible GI illness- thankfully resolved.   Recommended follow up: Return in about 6 months (around 12/03/2020) for physical or sooner  if needed.  Lab/Order associations: Not fasting   ICD-10-CM   1. Preventative health care  Z00.00 CBC with Differential/Platelet    Comprehensive metabolic panel    Lipid panel    TSH    VITAMIN D 25 Hydroxy (Vit-D Deficiency, Fractures)    PTH, intact (no Ca)  2. Hypothyroidism, unspecified type  E03.9 TSH  3. Vitamin D deficiency  E55.9 VITAMIN D 25 Hydroxy (Vit-D Deficiency, Fractures)    PTH, intact (no Ca)  4. Hyperlipidemia, unspecified hyperlipidemia type  E78.5 CBC with Differential/Platelet    Comprehensive metabolic panel    Lipid panel  5. 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 Screen for colon cancer  Z12.11 Cologuard  7. Increased PTH level  E34.9 PTH, intact (no Ca)  8. Morbid obesity ([place]) Chronic E66.01   9. Urinary incontinence, unspecified type  R32 Ambulatory referral to Urology  10. High risk medication use  Z79.899 Comprehensive metabolic panel  11. Onychomycosis  B35.1     Meds ordered this encounter  Medications  . terbinafine (LAMISIL) 250 MG tablet    Sig: Take 1 tablet (250 mg total) by mouth daily. For toenail fungus/onychomycosis    Dispense:  90 tablet    Refill:  0  . traMADol (ULTRAM) 50 MG tablet    Sig: Take 1 tablet (50 mg total) by mouth 3 (three) times daily as needed.    Dispense:  90 tablet    Refill:  5    If  prior auth needed- may send to office. She needs to avoid oral nsaids    Return precautions advised.  Garret Reddish, MD

## 2020-06-01 NOTE — Patient Instructions (Addendum)
Please stop by lab before you go  Team please make sure they do not release the FUTURE CMP- that should be done in 6 weeks- she will schedule a lab visit in 6 weeks If you have mychart- we will send your results within 3 business days of [name] receiving them.  If you do not have mychart- we will call you about results within 5 business days of [name] receiving them.  *please also note that you will see labs on mychart as soon as they post. I will later go in and write notes on them- will say "notes from Dr. Yong Channel"  lamisil daily for toenail fungus -If you have worsening toenail pain please let us place referral to podiatry for you  We will call you within two weeks about your referral to urology. If you do not hear within 2 weeks, give [name] a call.   Please stop by lab before you go If you have mychart- we will send your results within 3 business days of [name] receiving them.  If you do not have mychart- we will call you about results within 5 business days of [name] receiving them.  *please also note that you will see labs on mychart as soon as they post. I will later go in and write notes on them- will say "notes from Dr. Yong Channel"   Health Maintenance Due  Topic Date Due  . COLONOSCOPY-if you do not receive Cologuard within 3 weeks please let us know-we order this for you today.  Would be good to double check with insurance about coverage Never done   Recommended follow up: Return in about 6 months (around 12/03/2020) for physical or sooner if needed.

## 2020-06-02 ENCOUNTER — Encounter: Payer: Self-pay | Admitting: Family Medicine

## 2020-06-02 ENCOUNTER — Ambulatory Visit (INDEPENDENT_AMBULATORY_CARE_PROVIDER_SITE_OTHER): Payer: BC Managed Care – PPO | Admitting: Family Medicine

## 2020-06-02 ENCOUNTER — Other Ambulatory Visit: Payer: Self-pay

## 2020-06-02 VITALS — BP 122/72 | HR 87 | Temp 98.6°F | Ht 66.0 in | Wt 315.4 lb

## 2020-06-02 DIAGNOSIS — E039 Hypothyroidism, unspecified: Secondary | ICD-10-CM

## 2020-06-02 DIAGNOSIS — Z0000 Encounter for general adult medical examination without abnormal findings: Secondary | ICD-10-CM

## 2020-06-02 DIAGNOSIS — B351 Tinea unguium: Secondary | ICD-10-CM | POA: Diagnosis not present

## 2020-06-02 DIAGNOSIS — R32 Unspecified urinary incontinence: Secondary | ICD-10-CM

## 2020-06-02 DIAGNOSIS — Z0001 Encounter for general adult medical examination with abnormal findings: Secondary | ICD-10-CM | POA: Diagnosis not present

## 2020-06-02 DIAGNOSIS — E349 Endocrine disorder, unspecified: Secondary | ICD-10-CM

## 2020-06-02 DIAGNOSIS — Z79899 Other long term (current) drug therapy: Secondary | ICD-10-CM

## 2020-06-02 DIAGNOSIS — E785 Hyperlipidemia, unspecified: Secondary | ICD-10-CM

## 2020-06-02 DIAGNOSIS — E559 Vitamin D deficiency, unspecified: Secondary | ICD-10-CM | POA: Diagnosis not present

## 2020-06-02 DIAGNOSIS — F324 Major depressive disorder, single episode, in partial remission: Secondary | ICD-10-CM

## 2020-06-02 DIAGNOSIS — Z1211 Encounter for screening for malignant neoplasm of colon: Secondary | ICD-10-CM

## 2020-06-02 DIAGNOSIS — R7989 Other specified abnormal findings of blood chemistry: Secondary | ICD-10-CM

## 2020-06-02 LAB — CBC WITH DIFFERENTIAL/PLATELET
Basophils Absolute: 0 10*3/uL (ref 0.0–0.1)
Basophils Relative: 0.2 % (ref 0.0–3.0)
Eosinophils Absolute: 0.5 10*3/uL (ref 0.0–0.7)
Eosinophils Relative: 8.2 % — ABNORMAL HIGH (ref 0.0–5.0)
HCT: 40.3 % (ref 36.0–46.0)
Hemoglobin: 13.7 g/dL (ref 12.0–15.0)
Lymphocytes Relative: 21.4 % (ref 12.0–46.0)
Lymphs Abs: 1.4 10*3/uL (ref 0.7–4.0)
MCHC: 34 g/dL (ref 30.0–36.0)
MCV: 87.2 fl (ref 78.0–100.0)
Monocytes Absolute: 0.5 10*3/uL (ref 0.1–1.0)
Monocytes Relative: 8.1 % (ref 3.0–12.0)
Neutro Abs: 4 10*3/uL (ref 1.4–7.7)
Neutrophils Relative %: 62.1 % (ref 43.0–77.0)
Platelets: 336 10*3/uL (ref 150.0–400.0)
RBC: 4.62 Mil/uL (ref 3.87–5.11)
RDW: 13.3 % (ref 11.5–15.5)
WBC: 6.5 10*3/uL (ref 4.0–10.5)

## 2020-06-02 LAB — COMPREHENSIVE METABOLIC PANEL
ALT: 19 U/L (ref 0–35)
AST: 18 U/L (ref 0–37)
Albumin: 4.3 g/dL (ref 3.5–5.2)
Alkaline Phosphatase: 76 U/L (ref 39–117)
BUN: 18 mg/dL (ref 6–23)
CO2: 22 mEq/L (ref 19–32)
Calcium: 9.2 mg/dL (ref 8.4–10.5)
Chloride: 108 mEq/L (ref 96–112)
Creatinine, Ser: 0.63 mg/dL (ref 0.40–1.20)
GFR: 105.8 mL/min (ref >60.00)
Glucose, Bld: 86 mg/dL (ref 70–99)
Potassium: 4.2 mEq/L (ref 3.5–5.1)
Sodium: 140 mEq/L (ref 135–145)
Total Bilirubin: 0.6 mg/dL (ref 0.2–1.2)
Total Protein: 6.8 g/dL (ref 6.0–8.3)

## 2020-06-02 LAB — TSH: TSH: 0.84 u[IU]/mL (ref 0.35–4.50)

## 2020-06-02 LAB — LIPID PANEL
Cholesterol: 203 mg/dL — ABNORMAL HIGH (ref 0–200)
HDL: 50.9 mg/dL (ref >39.00)
LDL Cholesterol: 132 mg/dL — ABNORMAL HIGH (ref 0–99)
NonHDL: 152.13
Total CHOL/HDL Ratio: 4
Triglycerides: 103 mg/dL (ref 0.0–149.0)
VLDL: 20.6 mg/dL (ref 0.0–40.0)

## 2020-06-02 LAB — VITAMIN D 25 HYDROXY (VIT D DEFICIENCY, FRACTURES): VITD: 31.62 ng/mL (ref 30.00–100.00)

## 2020-06-02 MED ORDER — TERBINAFINE HCL 250 MG PO TABS: 250.0000 mg | ORAL_TABLET | Freq: Every day | ORAL | 0 refills | Status: DC

## 2020-06-02 MED ORDER — TRAMADOL HCL 50 MG PO TABS: 50.0000 mg | ORAL_TABLET | Freq: Three times a day (TID) | ORAL | 5 refills | Status: DC | PRN

## 2020-06-02 NOTE — Progress Notes (Signed)
Phone [phone] In person visit   Subjective:   Kara [name] is a 45 y.o. year old very pleasant female patient who presents for/with See problem oriented charting  This visit occurred during the SARS-CoV-2 public health emergency.  Safety protocols were in place, including screening questions prior to the visit, additional usage of staff PPE, and extensive cleaning of exam room while observing appropriate contact time as indicated for disinfecting solutions.   Past Medical History-  Patient Active Problem List   Diagnosis Date Noted  . 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 Restless legs syndrome 04/24/2017    Priority: Medium  . Osteoarthritis of both knees 04/19/2013    Priority: Medium  . Depression 02/22/2010    Priority: Medium  . Morbid obesity ([place]) 06/16/2008    Priority: Medium  . Hyperlipidemia 06/11/2007    Priority: Medium  . Hypothyroidism 08/18/2006    Priority: Medium  . Rotator cuff syndrome of right shoulder 02/27/2018    Priority: Low  . Allergic rhinitis 02/03/2014    Priority: Low  . Vitamin D deficiency 02/03/2014    Priority: Low  . BPPV (benign paroxysmal positional vertigo) 04/19/2013    Priority: Low  . DJD (degenerative joint disease) 10/26/2009    Priority: Low    Medications- reviewed and updated Current Outpatient Medications  Medication Sig Dispense Refill  . acetaminophen (TYLENOL) 325 MG tablet Take 650 mg by mouth 3 (three) times daily.    . calcium carbonate (OS-CAL - DOSED IN MG OF ELEMENTAL CALCIUM) 1250 (500 Ca) MG tablet Take 1 tablet by mouth 3 (three) times daily with meals.    Marland Kitchen Carbonyl Iron (IRON CHEWS PEDIATRIC PO) Take 1 tablet by mouth daily.    . cholecalciferol (VITAMIN D3) 25 MCG (1000 UNIT) tablet Take 2,000 Units by mouth daily.    . diazepam (VALIUM) 5 MG tablet Take 1 tablet (5 mg total) by mouth every 12 (twelve) hours as  needed (dizziness). 20 tablet 0  . gabapentin (NEURONTIN) 300 MG capsule TAKE 2 CAPSULES(600 MG) BY MOUTH AT BEDTIME 180 capsule 3  . glucosamine-chondroitin 500-400 MG tablet Take 1 tablet by mouth daily.    Marland Kitchen levocetirizine (XYZAL) 5 MG tablet Take 5 mg by mouth every evening.    Marland Kitchen levothyroxine (SYNTHROID) 125 MCG tablet TAKE 2 TABLETS BY MOUTH ON MONDAY, THURSDAY. TAKE 1 TABLET BY MOUTH the other 5 days of the week 145 tablet 3  . medroxyPROGESTERone (DEPO-PROVERA) 150 MG/ML injection Inject 150 mg into the muscle every 3 (three) months. Last dose was 12/10/12    . Melatonin 10 MG TABS Take 10 mg by mouth at bedtime.    . Multiple Vitamins-Minerals (BARIATRIC MULTIVITAMINS/IRON PO) Take 1 tablet by mouth 2 (two) times daily.    Marland Kitchen omeprazole (PRILOSEC) 40 MG capsule Take 1 capsule (40 mg total) by mouth daily. 30 capsule 3  . ondansetron (ZOFRAN ODT) 4 MG disintegrating tablet Take 1 tablet (4 mg total) by mouth every 8 (eight) hours as needed for nausea or vomiting. 20 tablet 0  . terbinafine (LAMISIL) 250 MG tablet Take 1 tablet (250 mg total) by mouth daily. For toenail fungus/onychomycosis 90 tablet 0  . traMADol (ULTRAM) 50 MG tablet TAKE 1 TABLET(50 MG) BY MOUTH THREE TIMES DAILY AS NEEDED 90 tablet 5  . venlafaxine XR (EFFEXOR-XR) 150 MG 24 hr capsule TAKE 1 CAPSULE(150 MG) BY MOUTH  DAILY WITH BREAKFAST 90 capsule 3  . vitamin B-12 (CYANOCOBALAMIN) 500 MCG tablet Take 500 mcg by mouth daily.    . Vitamin D, Ergocalciferol, (DRISDOL) 1.25 MG (50000 UNIT) CAPS capsule Take 1 capsule (50,000 Units total) by mouth every 7 (seven) days. 13 capsule 1   No current facility-administered medications for this visit.     Objective:  BP (!) 128/94   Pulse 87   Temp 98.6 F (37 C) (Temporal)   Ht 5' 6"  (1.676 m)   Wt (!) 315 lb 6.4 oz (143.1 kg)   SpO2 95%   BMI 50.91 kg/m  Gen: NAD, resting comfortably Right great toenail medial side cut down approximately 1/3 of the way into nail- does  not appear actively ingrown. Nail thickened and yellow  Will repeat BP on other note    Assessment and Plan   # onychomycosis/ingrown toenail S: Patient started with right great toenail pain on medial side approximately 2 weeks ago.  She is attempted to dislodge the area of the nail that was causing pain and has cut about a third of the way back into the nail.  Has thickened yellow toenail as well.  A/P: Patient's pain has improved with her home treatment but she would like to make this less likely to recur and with the thickness of the nail we think this could be a barrier.  We opted to treat with Lamisil for 12 weeks.  She will need repeat liver function test 6 weeks later.  I asked her to schedule a lab visit for 6 weeks and I also ordered future liver function test-though should not be released for the physical she is having today -If you have worsening toenail pain please let us place referral to podiatry for you  Recommended follow up: as needed if symptoms fail to improve  Lab/Order associations:   ICD-10-CM   1. Onychomycosis  B35.1  CMP in 6 weeks for high risk medication use    Meds ordered this encounter  Medications  . terbinafine (LAMISIL) 250 MG tablet    Sig: Take 1 tablet (250 mg total) by mouth daily. For toenail fungus/onychomycosis    Dispense:  90 tablet    Refill:  0   Return precautions advised.  Garret Reddish, MD

## 2020-06-05 LAB — PARATHYROID HORMONE, INTACT (NO CA): PTH: 34 pg/mL (ref 16–77)

## 2020-06-19 ENCOUNTER — Other Ambulatory Visit: Payer: Self-pay | Admitting: Family Medicine

## 2020-06-30 DIAGNOSIS — Z1211 Encounter for screening for malignant neoplasm of colon: Secondary | ICD-10-CM | POA: Diagnosis not present

## 2020-07-03 LAB — COLOGUARD: Cologuard: NEGATIVE

## 2020-07-06 ENCOUNTER — Encounter: Payer: Self-pay | Admitting: Family Medicine

## 2020-07-14 ENCOUNTER — Other Ambulatory Visit: Payer: Self-pay

## 2020-07-14 ENCOUNTER — Other Ambulatory Visit (INDEPENDENT_AMBULATORY_CARE_PROVIDER_SITE_OTHER): Payer: BC Managed Care – PPO

## 2020-07-14 DIAGNOSIS — Z79899 Other long term (current) drug therapy: Secondary | ICD-10-CM

## 2020-07-14 LAB — COMPREHENSIVE METABOLIC PANEL
ALT: 19 U/L (ref 0–35)
AST: 17 U/L (ref 0–37)
Albumin: 4.3 g/dL (ref 3.5–5.2)
Alkaline Phosphatase: 78 U/L (ref 39–117)
BUN: 14 mg/dL (ref 6–23)
CO2: 22 mEq/L (ref 19–32)
Calcium: 9.3 mg/dL (ref 8.4–10.5)
Chloride: 107 mEq/L (ref 96–112)
Creatinine, Ser: 0.69 mg/dL (ref 0.40–1.20)
GFR: 103.42 mL/min (ref >60.00)
Glucose, Bld: 87 mg/dL (ref 70–99)
Potassium: 4.2 mEq/L (ref 3.5–5.1)
Sodium: 140 mEq/L (ref 135–145)
Total Bilirubin: 0.6 mg/dL (ref 0.2–1.2)
Total Protein: 7.1 g/dL (ref 6.0–8.3)

## 2020-08-07 ENCOUNTER — Encounter: Payer: Self-pay | Admitting: Family Medicine

## 2020-08-08 ENCOUNTER — Other Ambulatory Visit: Payer: Self-pay

## 2020-08-08 MED ORDER — LEVOTHYROXINE SODIUM 125 MCG PO TABS: ORAL_TABLET | ORAL | 3 refills | Status: DC

## 2020-10-11 ENCOUNTER — Other Ambulatory Visit: Payer: Self-pay | Admitting: Family Medicine

## 2020-10-12 ENCOUNTER — Other Ambulatory Visit: Payer: Self-pay | Admitting: Family Medicine

## 2020-10-13 NOTE — Telephone Encounter (Signed)
Please clarify-she should still have refills available notes from 06/02/2020 prescription correct?  If she needs a refill now please provide me details such as prior office visit, last refill etc.

## 2020-10-13 NOTE — Telephone Encounter (Signed)
See the message from the pharmacy below.  Prescription MME cannot be calculated for this prescription. Enter discrete sig details to calculate prescription MME.

## 2020-12-06 NOTE — Progress Notes (Signed)
Phone [phone]   Subjective:  Patient presents today for their annual physical. Chief complaint-noted.   See problem oriented charting- ROS- full  review of systems was completed and negative except for: urinary frequency and urgency with incontinence, joint pain   The following were reviewed and entered/updated in epic: Past Medical History:  Diagnosis Date   ANXIETY 08/18/2006   DEGENERATIVE JOINT DISEASE, GENERALIZED 10/26/2009   DEPRESSION 02/22/2010   GERD (gastroesophageal reflux disease)    history of   HYPERLIPIDEMIA 06/11/2007   HYPOTHYROIDISM 08/18/2006   OBESITY 06/16/2008   Pneumonia    hx of    PONV (postoperative nausea and vomiting)    used Scopolamine patch   Patient Active Problem List   Diagnosis Date Noted   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    Priority: High   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    Priority: High   Restless legs syndrome 04/24/2017    Priority: Medium    Osteoarthritis of both knees 04/19/2013    Priority: Medium    Depression 02/22/2010    Priority: Medium    Morbid obesity ([place]) 06/16/2008    Priority: Medium    Hyperlipidemia 06/11/2007    Priority: Medium    Hypothyroidism 08/18/2006    Priority: Medium    Rotator cuff syndrome of right shoulder 02/27/2018    Priority: Low   Allergic rhinitis 02/03/2014    Priority: Low   Vitamin D deficiency 02/03/2014    Priority: Low   BPPV (benign paroxysmal positional vertigo) 04/19/2013    Priority: Low   DJD (degenerative joint disease) 10/26/2009    Priority: Low   Past Surgical History:  Procedure Laterality Date   CHOLECYSTECTOMY N/A 05/08/2012   Procedure: LAPAROSCOPIC CHOLECYSTECTOMY WITH INTRAOPERATIVE CHOLANGIOGRAM;  Surgeon: Leighton Ruff, MD;  Location: [facility];  Service: General;  Laterality: N/A;   FRACTURE SURGERY     GASTRIC ROUX-EN-Y  12/29/2012   Procedure: LAPAROSCOPIC ROUX-EN-Y GASTRIC BYPASS WITH UPPER ENDOSCOPY;  Surgeon: Madilyn Hook, DO;   Location: [facility];  Service: General;;   HARDWARE REMOVAL Right 06/14/2015   Procedure: Removal Internal Fixation Right Lateral Tibial Plateau;  Surgeon: [name], MD;  Location: [place];  Service: Orthopedics;  Laterality: Right;   HERNIA REPAIR     KNEE ARTHROSCOPY Right 07/28/12   dr. Sharol Given   ORIF TIBIA PLATEAU Right 03/10/2015   Procedure: OPEN REDUCTION INTERNAL FIXATION (ORIF) LATERAL TIBIAL PLATEAU;  Surgeon: [name], MD;  Location: [place];  Service: Orthopedics;  Laterality: Right;   ORIF ULNAR FRACTURE Right 1995   plates and pins   TOTAL KNEE ARTHROPLASTY Right 06/14/2015   TOTAL KNEE ARTHROPLASTY Right 06/14/2015   Procedure: RIGHT TOTAL KNEE ARTHROPLASTY;  Surgeon: [name], MD;  Location: [place];  Service: Orthopedics;  Laterality: Right;   UPPER GI ENDOSCOPY  12/29/2012   Procedure: UPPER GI ENDOSCOPY;  Surgeon: Madilyn Hook, DO;  Location: [facility];  Service: General;;    Family History  Problem Relation Age of Onset   Schizophrenia Mother    Cancer Mother        lung, smoker   Breast cancer Mother 32   Uterine cancer Mother    Heart disease Father 88       smoker at time    Medications- reviewed and updated Current Outpatient Medications  Medication Sig Dispense Refill   acetaminophen (TYLENOL) 325 MG tablet Take 650 mg by mouth 3 (three) times daily.  calcium carbonate (OS-CAL - DOSED IN MG OF ELEMENTAL CALCIUM) 1250 (500 Ca) MG tablet Take 1 tablet by mouth 3 (three) times daily with meals.     Carbonyl Iron (IRON CHEWS PEDIATRIC PO) Take 1 tablet by mouth daily.     cholecalciferol (VITAMIN D3) 25 MCG (1000 UNIT) tablet Take 5,000 Units by mouth daily.     diazepam (VALIUM) 5 MG tablet Take 1 tablet (5 mg total) by mouth every 12 (twelve) hours as needed (dizziness). 20 tablet 0   gabapentin (NEURONTIN) 300 MG capsule TAKE 2 CAPSULES(600 MG) BY MOUTH AT BEDTIME 180 capsule 3   glucosamine-chondroitin 500-400 MG tablet Take 1 tablet by mouth daily.      levocetirizine (XYZAL) 5 MG tablet Take 5 mg by mouth every evening.     levothyroxine (SYNTHROID) 125 MCG tablet Take 2 pills Monday through Thursday and 1 pill Friday through Sunday. 145 tablet 3   Melatonin 10 MG TABS Take 10 mg by mouth at bedtime.     Multiple Vitamins-Minerals (BARIATRIC MULTIVITAMINS/IRON PO) Take 1 tablet by mouth 2 (two) times daily.     omeprazole (PRILOSEC) 40 MG capsule Take 1 capsule (40 mg total) by mouth daily. 30 capsule 3   ondansetron (ZOFRAN ODT) 4 MG disintegrating tablet Take 1 tablet (4 mg total) by mouth every 8 (eight) hours as needed for nausea or vomiting. 20 tablet 0   terbinafine (LAMISIL) 250 MG tablet Take 1 tablet (250 mg total) by mouth daily. For toenail fungus/onychomycosis 90 tablet 0   traMADol (ULTRAM) 50 MG tablet Take 1 tablet (50 mg total) by mouth 3 (three) times daily as needed. 90 tablet 5   venlafaxine XR (EFFEXOR-XR) 150 MG 24 hr capsule TAKE 1 CAPSULE(150 MG) BY MOUTH DAILY WITH BREAKFAST 90 capsule 3   vitamin B-12 (CYANOCOBALAMIN) 500 MCG tablet Take 500 mcg by mouth daily.     medroxyPROGESTERone (DEPO-PROVERA) 150 MG/ML injection Inject 150 mg into the muscle every 3 (three) months. Last dose was 12/10/12 (Patient not taking: Reported on 12/13/2020)     Vitamin D, Ergocalciferol, (DRISDOL) 1.25 MG (50000 UNIT) CAPS capsule Take 1 capsule (50,000 Units total) by mouth every 7 (seven) days. (Patient not taking: Reported on 12/13/2020) 13 capsule 1   No current facility-administered medications for this visit.    Allergies-reviewed and updated Allergies  Allergen Reactions   Asa [Aspirin] Itching and Swelling    Social History   Social History Narrative   Married (spouse transgender female to female). No children. Several pets-1 dog, 3 cats, 1 [place] pig, 1 chinchilla       Re[facility]ist [facility] 2022   [facility]   Prior [facility]. Also trained Camera operator.       Hobbies:  enjoys Luther Parody, nascar, working out   Objective  Objective:  BP 122/84   Pulse 91   Temp 98.1 F (36.7 C) (Temporal)   Ht 5' 6"  (1.676 m)   Wt (!) 327 lb 6.4 oz (148.5 kg)   SpO2 98%   BMI 52.84 kg/m  Gen: NAD, resting comfortably HEENT: Mucous membranes are moist. Oropharynx normal Neck: no thyromegaly CV: RRR no murmurs rubs or gallops Lungs: CTAB no crackles, wheeze, rhonchi Abdomen: soft/nontender/nondistended/normal bowel sounds. No rebound or guarding. Obesity noted Ext: no edema Skin: warm, dry Neuro: grossly normal, moves all extremities, PERRLA  We examined from chair- painful for her to get up and down onto table with knees  and hips   Assessment and Plan   45 y.o. female presenting for annual physical.  Health Maintenance counseling: 1. Anticipatory guidance: Patient counseled regarding regular dental exams -q6 months, eye exams- yearly or so - plans to schedule,  avoiding smoking and second hand smoke , limiting alcohol to 1 beverage per day.  No illicit drugs  2. Risk factor reduction:  Advised patient of need for regular exercise and diet rich and fruits and vegetables to reduce risk of heart attack and stroke.   Exercise- has [place] available doing sparingly (encouraged to increase). Has not been able to rejoin [facility]- price up and schedule not working well. Wants to try to get up and move work as well  Diet- weight going up even though she feels like eating reasonably healthy per patient (not as active with work)-   Weight last CPE 309- continues to trend up .  Morbid obesity with BMI over 40. $50 copay and healthy weigh to wellness would be cost prohibitive. Encouraged to consider weight watchers.  Wt Readings from Last 3 Encounters:  12/13/20 (!) 327 lb 6.4 oz (148.5 kg)  06/02/20 (!) 315 lb 6.4 oz (143.1 kg)  05/02/20 (!) 308 lb 6.8 oz (139.9 kg)  3. Immunizations/screenings/ancillary studies DISCUSSED:  -covid booster vaccination #4- opts out for  now- will set up with [facility] -Prevnar-20 vaccination #1- opts out- indication appears to be morbid obesity Immunization History  Administered Date(s) Administered   Influenza Split 11/08/2010   Influenza Whole 10/29/2006   Influenza,inj,Quad PF,6+ Mos 10/20/2012, 11/11/2013, 10/10/2014, 03/26/2017, 01/26/2018, 10/28/2018, 11/30/2019   PFIZER(Purple Top)SARS-COV-2 Vaccination 04/13/2019, 05/11/2019, 12/03/2019   Td 12/18/2006   Tdap 04/24/2017   4. Cervical cancer screening- pap smear 05/18/18 with 3 year repeat planned with GYN Dr.Lowe- states had another in 2021- apparently doing yearly and doing depo shots (has been off 6 months but no period- may be postmenopausal) 5. Breast cancer screening-  breast exam with GYN and mammogram 06/18/19- advised to consider yearly 6. Colon cancer screening - Cologuard 07/03/20 - good for 3 years 7. Skin cancer screening- No dermatologist. advised regular sunscreen use. Denies worrisome, changing, or new skin lesions.  8. Birth control/STD check- monogamous.  On Depo-Provera for heavy periods in the past- off for 6 months and no periods. Monogamous  9. Osteoporosis screening at 32- we will plan on this if not done by gynecology- may actually be postmenopausal now -Former smoker- quit 2007. gets UAs with gynecology-no regular screening required with under 20 pack years- under 10 pack years     Status of chronic or acute concerns   [employer] insurance at 06/02/20 but she states was told could have another CPE- we are billing as CPE as result  #hyperlipidemia S: Medication:None -ASCVD risk has been below 2%- 1% with current calculation Lab Results  Component Value Date   CHOL 203 (H) 06/02/2020   HDL 50.90 06/02/2020   LDLCALC 132 (H) 06/02/2020   LDLDIRECT 96.0 05/23/2014   TRIG 103.0 06/02/2020   CHOLHDL 4 06/02/2020   A/P: Lipids done earlier this year-does not need to be repeated at this time.  10-year ASCVD risk only 1% and would not recommend  cholesterol medicine in this range-continue to monitor  # Depression S: Medication: Venlafaxine 170m XR  Daily -Symptoms were worsened with prior employer- stress better now working with humans!  -Celexa was not effective.  Has done therapy with behavioral health with [facility]  in the past -full remission 05/22 visit.  Depression screen [facility] 2/9 12/13/2020 06/02/2020 11/30/2019  Decreased Interest 0 0 0  Down, Depressed, Hopeless 0 0 0  PHQ - 2 Score 0 0 0  Altered sleeping 0 0 2  Tired, decreased energy 0 0 0  Change in appetite 2 0 1  Feeling bad or failure about yourself  0 0 0  Trouble concentrating 0 0 0  Moving slowly or fidgety/restless 0 0 0  Suicidal thoughts 0 0 0  PHQ-9 Score 2 0 3  Difficult doing work/chores Not difficult at all Not difficult at all -  Some recent data might be hidden  A/P: Depression in full remission-continue current medications   #hypothyroidism S: compliant On thyroid medication- Levothyroxine 1108mg take 2 tablets by mouth on Monday through Thursday. Take 1 tablet by mouth the other 3 days of the week.  Lab Results  Component Value Date   TSH 0.84 06/02/2020   A/P: likely stable - she wants to hold off on labs for now- continue current meds   #Vitamin D deficiency/history of high PTH-improving on calcium and vitamin D S: Medication: 5000 units per day Last vitamin D Lab Results  Component Value Date   VD25OH 31.62 06/02/2020  A/P: controlled last visit- continue current meds - recheck next year   #Left knee osteoarthritis S: Patient followed with Dr. DSharol Given  We had at times done injections for her most recently November 30, 2019. -Oral NSAIDs not ideal with gastric bypass -Used a combination of tramadol and Tylenol.  Aspercreme had worked better for her than Voltaren gel - wose with rain -reasonable controlled  -Prior knee replacement on the right in 2017  - yesterday up to 9/10 pain, today 6/10 pain.   Pain control usually 10 months after  injection A/P: she would like to come back for injection- apparently we are out of kenalog- team will reach out   #Restless legs- medication: Gabapentin 600 mg at bedtime. Reasonable control - continue current meds   #History of COVID-19 March 2019-patient had lingering fatigue for sometime- still lingered aon 05/22 visit but improved - almost back to normal   #Incontinence- had been working with GYN.  Kegel exercises had not been helpful.  Myrbetriq did not help. She requested referral on 05/22 visit to urology- this was placed. UTI treatment in past helped but never resolved  - today she requests another referral due to new insurance- this was sent in  #had a fall at home tripping over something- broke a tooth and busted lip. No headaches did not hit head.   #nausea after bypass- zofran helps  #BPPV- efill diazepam  #onychomycosis- finished course- nail growing in more normal but still thicker- we will monitor and considre retreat next visit  Recommended follow up: No follow-ups on file.  Lab/Order associations: fasting   ICD-10-CM   1. Preventative health care  Z00.00     2. Hyperlipidemia, unspecified hyperlipidemia type  E78.5     3. Vitamin D deficiency  E55.9     4. Primary osteoarthritis of left knee  M17.12     5. Hypothyroidism, unspecified type  E03.9     6. Major depressive disorder in partial remission, unspecified whether recurrent ([place]  F32.4     7. Restless legs syndrome  G25.81     8. History of COVID-19  Z86.16       No orders of the defined types were placed in this encounter.  I,Jada Bradford,acting as a scribe for SGarret Reddish MD.,have documented all  relevant documentation on the behalf of Garret Reddish, MD,as directed by  Garret Reddish, MD while in the presence of Garret Reddish, MD.  I, Garret Reddish, MD, have reviewed all documentation for this visit. The documentation on 12/13/20 for the exam, diagnosis, procedures, and orders are all  accurate and complete.   Return precautions advised.  Garret Reddish, MD

## 2020-12-13 ENCOUNTER — Encounter: Payer: Self-pay | Admitting: Family Medicine

## 2020-12-13 ENCOUNTER — Other Ambulatory Visit: Payer: Self-pay

## 2020-12-13 ENCOUNTER — Ambulatory Visit (INDEPENDENT_AMBULATORY_CARE_PROVIDER_SITE_OTHER): Payer: BC Managed Care – PPO | Admitting: Family Medicine

## 2020-12-13 VITALS — BP 122/84 | HR 91 | Temp 98.1°F | Ht 66.0 in | Wt 327.4 lb

## 2020-12-13 DIAGNOSIS — Z8616 Personal history of COVID-19: Secondary | ICD-10-CM

## 2020-12-13 DIAGNOSIS — Z0000 Encounter for general adult medical examination without abnormal findings: Secondary | ICD-10-CM | POA: Diagnosis not present

## 2020-12-13 DIAGNOSIS — F324 Major depressive disorder, single episode, in partial remission: Secondary | ICD-10-CM

## 2020-12-13 DIAGNOSIS — M1712 Unilateral primary osteoarthritis, left knee: Secondary | ICD-10-CM | POA: Diagnosis not present

## 2020-12-13 DIAGNOSIS — E559 Vitamin D deficiency, unspecified: Secondary | ICD-10-CM | POA: Diagnosis not present

## 2020-12-13 DIAGNOSIS — E039 Hypothyroidism, unspecified: Secondary | ICD-10-CM

## 2020-12-13 DIAGNOSIS — E785 Hyperlipidemia, unspecified: Secondary | ICD-10-CM | POA: Diagnosis not present

## 2020-12-13 DIAGNOSIS — G2581 Restless legs syndrome: Secondary | ICD-10-CM

## 2020-12-13 DIAGNOSIS — N3281 Overactive bladder: Secondary | ICD-10-CM

## 2020-12-13 MED ORDER — DIAZEPAM 5 MG PO TABS: 5.0000 mg | ORAL_TABLET | Freq: Two times a day (BID) | ORAL | 0 refills | Status: DC | PRN

## 2020-12-13 MED ORDER — ONDANSETRON 4 MG PO TBDP: 4.0000 mg | ORAL_TABLET | Freq: Three times a day (TID) | ORAL | 0 refills | Status: DC | PRN

## 2020-12-13 MED ORDER — TRAMADOL HCL 50 MG PO TABS: 50.0000 mg | ORAL_TABLET | Freq: Three times a day (TID) | ORAL | 5 refills | Status: DC | PRN

## 2020-12-13 NOTE — Addendum Note (Signed)
Addended by: Gean Birchwood on: 12/13/2020 08:52 AM   Modules accepted: Orders

## 2020-12-13 NOTE — Patient Instructions (Addendum)
Set up covid bivalent booster with covid  Repeat labs next time most likely  I asked my team to let me know when we get the steroid back in for your knee and we will try to reach out- if you dont hear within 3 weeks please let me know  Prevnar 20 today  Recommended follow up: Return in about 6 months (around 06/12/2021) for follow up- or sooner if needed.

## 2021-01-02 DIAGNOSIS — M4726 Other spondylosis with radiculopathy, lumbar region: Secondary | ICD-10-CM | POA: Diagnosis not present

## 2021-01-02 DIAGNOSIS — M545 Low back pain, unspecified: Secondary | ICD-10-CM | POA: Diagnosis not present

## 2021-01-02 DIAGNOSIS — M5136 Other intervertebral disc degeneration, lumbar region: Secondary | ICD-10-CM | POA: Diagnosis not present

## 2021-01-04 ENCOUNTER — Encounter: Payer: Self-pay | Admitting: Family Medicine

## 2021-01-11 ENCOUNTER — Encounter: Payer: Self-pay | Admitting: Family Medicine

## 2021-01-25 ENCOUNTER — Other Ambulatory Visit: Payer: Self-pay | Admitting: Family Medicine

## 2021-02-05 ENCOUNTER — Other Ambulatory Visit: Payer: Self-pay | Admitting: Family Medicine

## 2021-02-07 ENCOUNTER — Encounter: Payer: Self-pay | Admitting: *Deleted

## 2021-02-07 NOTE — Telephone Encounter (Signed)
Patient notified

## 2021-02-14 DIAGNOSIS — M25552 Pain in left hip: Secondary | ICD-10-CM | POA: Diagnosis not present

## 2021-02-14 DIAGNOSIS — M5459 Other low back pain: Secondary | ICD-10-CM | POA: Diagnosis not present

## 2021-02-20 DIAGNOSIS — M545 Low back pain, unspecified: Secondary | ICD-10-CM | POA: Diagnosis not present

## 2021-03-27 ENCOUNTER — Ambulatory Visit (INDEPENDENT_AMBULATORY_CARE_PROVIDER_SITE_OTHER): Payer: BC Managed Care – PPO | Admitting: Psychology

## 2021-03-27 DIAGNOSIS — F331 Major depressive disorder, recurrent, moderate: Secondary | ICD-10-CM

## 2021-03-27 DIAGNOSIS — F411 Generalized anxiety disorder: Secondary | ICD-10-CM | POA: Diagnosis not present

## 2021-03-27 NOTE — Progress Notes (Signed)
° ° ° ° ° ° ° ° ° ° ° ° ° ° °  Jan Fireman, [facility]
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
## 2021-04-10 ENCOUNTER — Ambulatory Visit (INDEPENDENT_AMBULATORY_CARE_PROVIDER_SITE_OTHER): Payer: BC Managed Care – PPO | Admitting: Psychology

## 2021-04-10 DIAGNOSIS — F411 Generalized anxiety disorder: Secondary | ICD-10-CM

## 2021-04-10 DIAGNOSIS — F331 Major depressive disorder, recurrent, moderate: Secondary | ICD-10-CM | POA: Diagnosis not present

## 2021-04-10 NOTE — Progress Notes (Signed)
? ? ? ? ? ? ? ? ? ? ? ? ? ? ?  Jan Fireman, [facility] ?

## 2021-04-10 NOTE — Progress Notes (Signed)
[place] Counselor/Therapist Progress Note ? ?Patient ID: Kara [name], MRN: [id number],   ? ?Date: 04/10/2021 ? ?Time Spent: 12:03pm-12:54pm  ? ?Treatment Type: Individual Therapy  Pt is seen for a virtual video visit via webex.  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   ? ?Reported Symptoms:  improved communication w/ partner, anxiety more stable, some worries w/ being alone next week.   ? ?Mental Status Exam: ?Appearance:  Well Groomed     ?Behavior: Appropriate  ?Motor: Normal  ?Speech/Language:  Normal Rate  ?Affect: Appropriate  ?Mood: anxious  ?Thought process: normal  ?Thought content:   WNL  ?Sensory/Perceptual disturbances:   WNL  ?Orientation: oriented to person, place, time/date, and situation  ?Attention: Good  ?Concentration: Good  ?Memory: WNL  ?Fund of knowledge:  Good  ?Insight:   Good  ?Judgment:  Good  ?Impulse Control: Good  ? ?Risk Assessment: ?Danger to Self:  No ?Self-injurious Behavior: No ?Danger to Others: No ?Duty to Warn:no ?Physical Aggression / Violence:No  ?Access to Firearms a concern: No  ?Gang Involvement:No  ? ?Subjective: Counselor assessed pt current functioning per pt report.  Processed w/pt interactions w/ partner, mood and coping w/ anxiety.  Explored effective communication and reflected positives w/.  Discussed stressor of selling dad's car and validating and normalized grief response.  Explored worries as day partner leaves for a week approaches.  Discussed self care, relaxation strategies and grounding daily to assist coping w/ stressor.  Pt affect wnl.  Pt reported anxiety has been more stable.  No panic or crisis.  Pt reported that communication w/ wife has been positive and both intentional about engaging and giving space.  Pt reported that she had difficulty w/ selling dad's car this past weekend as representing connection to dad.  Pt validated her own emotions.  Pt reported on worries w/ her wife leaving for a  week this weekend and being alone over next week.  Pt reported that planning for how to take care of self, recognizing rumination and being encouraging.  Pt discussed ways of connecting and validating wife need for time w/ mom w/out her present. Pt identified the activities she is doing for grounding and relaxation.  ? ?Interventions: Cognitive Behavioral Therapy and Assertiveness/Communication ? ?Diagnosis:Generalized anxiety disorder ? ?Major depressive disorder, recurrent episode, moderate ([place]) ? ?Plan: Pt to f/u in 2 weeks for counseling.  Pt to f/u as scheduled w/ PCP. ? 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  ?Client Treatment Preferences  ?biweekly counseling. continue medication management.  ?Client Statement of Needs  ?Pt "mainly to figure out a better way to deal w/ my anxiety as comes up and to communicate my concerns so that not coming off as accusatory and be more confident in myself."  ?Treatment Level  ?outpatient counseling  ?Symptoms  ?Depressed or irritable mood.: No Description Entered (Status: maintained). Excessive and/or unrealistic worry that is difficult to control occurring more days than not for at least 6 months about a number of events or activities.: No Description Entered (Status: maintained). Ineffective communication: No Description Entered (Status: maintained).  ?Problems Addressed  ?Anxiety, Unipolar Depression  ?Goals ?1. Enhance ability to effectively cope with the full variety of life's worries and anxieties. ?Objective ?Learn and implement personal and interpersonal skills to reduce anxiety and improve interpersonal relationships. ?Target Date: 2022-04-11 Frequency:  Daily  ?Progress: 0 Modality: individual  ?Objective ?Learn and implement calming skills to reduce overall anxiety and manage anxiety  symptoms. ?Target Date: 2022-04-11 Frequency: Daily  ?Progress: 0 Modality: individual  ?Objective ?Identify, challenge, and replace biased, fearful self-talk with positive, realistic, and empowering self-talk. ?Target Date: 2022-04-11 Frequency: Daily  ?Progress: 0 Modality: individual  ?2. Recognize, accept, and cope with feelings of depression. ?Objective ?Identify and replace thoughts and beliefs that support depression. ?Target Date: 2022-04-11 Frequency: Daily  ?Progress: 0 Modality: individual  ?Related Interventions ?Conduct Cognitive-Behavioral Therapy (see Cognitive Behavior Therapy by Olevia Bowens; Overcoming Depression by Lynita Lombard al.), beginning with helping the client learn the connection among cognition, depressive feelings, and actions. ?Objective ?Increasingly verbalize hopeful and positive statements regarding self, others, and the future. ?Target Date: 2022-04-11 Frequency: Daily  ?Progress: 0 Modality: individual  ?Related Interventions ?Assign the client to write at least one positive affirmation statement daily regarding himself/herself and the future (or assign "Positive Self-Talk" in the Adult Psychotherapy Homework Planner by Bryn Gulling). ?Pt participated in developing plan and provider verbal consent. ? ?Elon Lomeli, [facility] ? ? ? ?

## 2021-04-15 ENCOUNTER — Other Ambulatory Visit: Payer: Self-pay | Admitting: Family Medicine

## 2021-04-19 DIAGNOSIS — M545 Low back pain, unspecified: Secondary | ICD-10-CM | POA: Diagnosis not present

## 2021-04-19 DIAGNOSIS — M4726 Other spondylosis with radiculopathy, lumbar region: Secondary | ICD-10-CM | POA: Diagnosis not present

## 2021-04-19 DIAGNOSIS — M5136 Other intervertebral disc degeneration, lumbar region: Secondary | ICD-10-CM | POA: Diagnosis not present

## 2021-04-25 ENCOUNTER — Ambulatory Visit: Payer: BC Managed Care – PPO | Admitting: Psychology

## 2021-04-30 ENCOUNTER — Ambulatory Visit (INDEPENDENT_AMBULATORY_CARE_PROVIDER_SITE_OTHER): Payer: BC Managed Care – PPO | Admitting: Psychology

## 2021-04-30 DIAGNOSIS — F331 Major depressive disorder, recurrent, moderate: Secondary | ICD-10-CM

## 2021-04-30 DIAGNOSIS — F411 Generalized anxiety disorder: Secondary | ICD-10-CM

## 2021-04-30 NOTE — Progress Notes (Signed)
[place] Counselor/Therapist Progress Note ? ?Patient ID: Kara [name], MRN: [id number],   ? ?Date: 04/30/2021 ? ?Time Spent: 1:30pm-2:28pm  ? ?Treatment Type: Individual Therapy  Pt is seen for a virtual video visit via webex.  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   ? ?Reported Symptoms:  anxiety and worries w/ transitions  ? ?Mental Status Exam: ?Appearance:  Well Groomed     ?Behavior: Appropriate  ?Motor: Normal  ?Speech/Language:  Normal Rate  ?Affect: Appropriate  ?Mood: anxious  ?Thought process: normal  ?Thought content:   WNL  ?Sensory/Perceptual disturbances:   WNL  ?Orientation: oriented to person, place, time/date, and situation  ?Attention: Good  ?Concentration: Good  ?Memory: WNL  ?Fund of knowledge:  Good  ?Insight:   Good  ?Judgment:  Good  ?Impulse Control: Good  ? ?Risk Assessment: ?Danger to Self:  No ?Self-injurious Behavior: No ?Danger to Others: No ?Duty to Warn:no ?Physical Aggression / Violence:No  ?Access to Firearms a concern: No  ?Gang Involvement:No  ? ?Subjective: Counselor assessed pt current functioning per pt report.  Processed w/pt interactions w/ partner, mood and worries.  Discussed positives w/ coping w/ being alone when wife gone.  Identified contributing factors to worries and escalation to conflict.  Reflected positive of given space and not insisting on resolving at that moment. Explored communication in effective ways to resolve. Pt affect wnl.  Pt reported she did well w/ wife being gone couple weeks ago.  Pt reported that over weekend anxiety escalated, identified cognitive distortions about relationship emerged and ineffective communication /conflict w/ wife. Pt was able to acknowledge contributing stressors that impacted- friend of wife's leasing spare room, feedback from work on change needed.  Pt was able to give wife space when voiced needed and pt hasn't been ruminating on.  Pt was able to identify things she  could have done differently and what needs to communicate.   ?Interventions: Cognitive Behavioral Therapy and Assertiveness/Communication ? ?Diagnosis:Generalized anxiety disorder ? ?Major depressive disorder, recurrent episode, moderate ([place]) ? ?Plan: Pt to f/u in 2 weeks for counseling.  Pt to f/u as scheduled w/ PCP. ? 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  ?Client Treatment Preferences  ?biweekly counseling. continue medication management.  ?Client Statement of Needs  ?Pt "mainly to figure out a better way to deal w/ my anxiety as comes up and to communicate my concerns so that not coming off as accusatory and be more confident in myself."  ?Treatment Level  ?outpatient counseling  ?Symptoms  ?Depressed or irritable mood.: No Description Entered (Status: maintained). Excessive and/or unrealistic worry that is difficult to control occurring more days than not for at least 6 months about a number of events or activities.: No Description Entered (Status: maintained). Ineffective communication: No Description Entered (Status: maintained).  ?Problems Addressed  ?Anxiety, Unipolar Depression  ?Goals ?1. Enhance ability to effectively cope with the full variety of life's worries and anxieties. ?Objective ?Learn and implement personal and interpersonal skills to reduce anxiety and improve interpersonal relationships. ?Target Date: 2022-04-11 Frequency: Daily  ?Progress: 0 Modality: individual  ?Objective ?Learn and implement calming skills to reduce overall anxiety and manage anxiety symptoms. ?Target Date: 2022-04-11 Frequency: Daily  ?Progress: 0 Modality: individual  ?Objective ?Identify, challenge, and replace biased, fearful self-talk with positive, realistic, and empowering self-talk. ?Target Date: 2022-04-11 Frequency:  Daily  ?Progress: 0  Modality: individual  ?2. Recognize, accept, and cope with feelings of depression. ?Objective ?Identify and replace thoughts and beliefs that support depression. ?Target Date: 2022-04-11 Frequency: Daily  ?Progress: 0 Modality: individual  ?Related Interventions ?Conduct Cognitive-Behavioral Therapy (see Cognitive Behavior Therapy by Olevia Bowens; Overcoming Depression by Lynita Lombard al.), beginning with helping the client learn the connection among cognition, depressive feelings, and actions. ?Objective ?Increasingly verbalize hopeful and positive statements regarding self, others, and the future. ?Target Date: 2022-04-11 Frequency: Daily  ?Progress: 0 Modality: individual  ?Related Interventions ?Assign the client to write at least one positive affirmation statement daily regarding himself/herself and the future (or assign "Positive Self-Talk" in the Adult Psychotherapy Homework Planner by Bryn Gulling). ?Pt participated in developing plan and provider verbal consent. ? ?[name] ? ? ? ? ? ? ? ? ? ? ? ? ? ? ? ? ? Jan Fireman, [facility] ?

## 2021-05-14 ENCOUNTER — Ambulatory Visit: Payer: BC Managed Care – PPO | Admitting: Psychology

## 2021-05-29 ENCOUNTER — Ambulatory Visit: Payer: BC Managed Care – PPO | Admitting: Psychology

## 2021-05-29 ENCOUNTER — Ambulatory Visit (INDEPENDENT_AMBULATORY_CARE_PROVIDER_SITE_OTHER): Payer: BC Managed Care – PPO | Admitting: Psychology

## 2021-05-29 DIAGNOSIS — F411 Generalized anxiety disorder: Secondary | ICD-10-CM | POA: Diagnosis not present

## 2021-05-29 DIAGNOSIS — F331 Major depressive disorder, recurrent, moderate: Secondary | ICD-10-CM | POA: Diagnosis not present

## 2021-05-29 NOTE — Progress Notes (Signed)
[place] Counselor/Therapist Progress Note ? ?Patient ID: Kara [name], MRN: [id number],   ? ?Date: 05/29/2021 ? ?Time Spent: 12:02pm-12:57pm  ? ?Treatment Type: Individual Therapy  Pt is seen for a virtual video visit via webex.  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   ? ?Reported Symptoms:  stressors w/ transitions, effective communication ? ?Mental Status Exam: ?Appearance:  Well Groomed     ?Behavior: Appropriate  ?Motor: Normal  ?Speech/Language:  Normal Rate  ?Affect: Appropriate  ?Mood: normal  ?Thought process: normal  ?Thought content:   WNL  ?Sensory/Perceptual disturbances:   WNL  ?Orientation: oriented to person, place, time/date, and situation  ?Attention: Good  ?Concentration: Good  ?Memory: WNL  ?Fund of knowledge:  Good  ?Insight:   Good  ?Judgment:  Good  ?Impulse Control: Good  ? ?Risk Assessment: ?Danger to Self:  No ?Self-injurious Behavior: No ?Danger to Others: No ?Duty to Warn:no ?Physical Aggression / Violence:No  ?Access to Firearms a concern: No  ?Gang Involvement:No  ? ?Subjective: Counselor assessed pt current functioning per pt report.  Processed w/pt interactions w/ partner and roommate.  Reflected positives w/ effective communication.  Explored w/pt relationship w/ mother in law and emotions from lack of anticipated response.  Discussed pt options for coping through and whether to respond or not.  Pt affect wnl.  Pt reported some stressors w/ transition of having roommate living w/ them.  Discussed communication w/ her partner and ways to set boundaries w/ roommate.  Discussed feelings from lack of caring response from mother in law and recognized pattern.  Pt discussed whether to confront or to cope through feelings and readjust her expectations. ? ?Interventions: Cognitive Behavioral Therapy and Assertiveness/Communication ? ?Diagnosis:Generalized anxiety disorder ? ?Major depressive disorder, recurrent episode, moderate  ([place]) ? ?Plan: Pt to f/u in 2 weeks for counseling.  Pt to f/u as scheduled w/ PCP. ? 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  ?Client Treatment Preferences  ?biweekly counseling. continue medication management.  ?Client Statement of Needs  ?Pt "mainly to figure out a better way to deal w/ my anxiety as comes up and to communicate my concerns so that not coming off as accusatory and be more confident in myself."  ?Treatment Level  ?outpatient counseling  ?Symptoms  ?Depressed or irritable mood.: No Description Entered (Status: maintained). Excessive and/or unrealistic worry that is difficult to control occurring more days than not for at least 6 months about a number of events or activities.: No Description Entered (Status: maintained). Ineffective communication: No Description Entered (Status: maintained).  ?Problems Addressed  ?Anxiety, Unipolar Depression  ?Goals ?1. Enhance ability to effectively cope with the full variety of life's worries and anxieties. ?Objective ?Learn and implement personal and interpersonal skills to reduce anxiety and improve interpersonal relationships. ?Target Date: 2022-04-11 Frequency: Daily  ?Progress: 0 Modality: individual  ?Objective ?Learn and implement calming skills to reduce overall anxiety and manage anxiety symptoms. ?Target Date: 2022-04-11 Frequency: Daily  ?Progress: 0 Modality: individual  ?Objective ?Identify, challenge, and replace biased, fearful self-talk with positive, realistic, and empowering self-talk. ?Target Date: 2022-04-11 Frequency: Daily  ?Progress: 0 Modality: individual  ?2. Recognize, accept, and cope with feelings of depression. ?Objective ?Identify and replace thoughts and beliefs that support depression. ?Target Date: 2022-04-11 Frequency: Daily  ?Progress: 0 Modality: individual   ?Related Interventions ?Conduct Cognitive-Behavioral  Therapy (see Cognitive Behavior Therapy by Olevia Bowens; Overcoming Depression by Lynita Lombard al.), beginning with helping the client learn the connection among cognition, depressive feelings, and actions. ?Objective ?Increasingly verbalize hopeful and positive statements regarding self, others, and the future. ?Target Date: 2022-04-11 Frequency: Daily  ?Progress: 0 Modality: individual  ?Related Interventions ?Assign the client to write at least one positive affirmation statement daily regarding himself/herself and the future (or assign "Positive Self-Talk" in the Adult Psychotherapy Homework Planner by Bryn Gulling). ?Pt participated in developing plan and provider verbal consent. ? ? Jan Fireman, [facility] ?

## 2021-05-31 ENCOUNTER — Encounter: Payer: Self-pay | Admitting: Family Medicine

## 2021-05-31 ENCOUNTER — Other Ambulatory Visit: Payer: Self-pay

## 2021-05-31 DIAGNOSIS — N3281 Overactive bladder: Secondary | ICD-10-CM

## 2021-06-12 ENCOUNTER — Ambulatory Visit (INDEPENDENT_AMBULATORY_CARE_PROVIDER_SITE_OTHER): Payer: BC Managed Care – PPO | Admitting: Psychology

## 2021-06-12 DIAGNOSIS — F411 Generalized anxiety disorder: Secondary | ICD-10-CM | POA: Diagnosis not present

## 2021-06-12 DIAGNOSIS — M545 Low back pain, unspecified: Secondary | ICD-10-CM | POA: Diagnosis not present

## 2021-06-12 DIAGNOSIS — F331 Major depressive disorder, recurrent, moderate: Secondary | ICD-10-CM

## 2021-06-12 NOTE — Progress Notes (Signed)
[place] Counselor/Therapist Progress Note ? ?Patient ID: Kara [name], MRN: [id number],   ? ?Date: 06/12/2021 ? ?Time Spent: 12:07pm-12:54pm  ? ?Treatment Type: Individual Therapy  Pt is seen for a virtual video visit via caregility.  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   ? ?Reported Symptoms:  stressors w/ work, effective communication ? ?Mental Status Exam: ?Appearance:  Well Groomed     ?Behavior: Appropriate  ?Motor: Normal  ?Speech/Language:  Normal Rate  ?Affect: Appropriate  ?Mood: normal  ?Thought process: normal  ?Thought content:   WNL  ?Sensory/Perceptual disturbances:   WNL  ?Orientation: oriented to person, place, time/date, and situation  ?Attention: Good  ?Concentration: Good  ?Memory: WNL  ?Fund of knowledge:  Good  ?Insight:   Good  ?Judgment:  Good  ?Impulse Control: Good  ? ?Risk Assessment: ?Danger to Self:  No ?Self-injurious Behavior: No ?Danger to Others: No ?Duty to Warn:no ?Physical Aggression / Violence:No  ?Access to Firearms a concern: No  ?Gang Involvement:No  ? ?Subjective: Counselor assessed pt current functioning per pt report.  Processed w/pt stressors and positives.  Explored interactions w/ partner using effective communication.  Discussed pt self care and need to attend to self- not just others.   Pt affect wnl.  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  Pt reported on positive w/ communication w/ partner.  Pt discussed boundaries w/ roommate and mother in law for her and partner. Pt recognized that usually put helping for others and rarely for self. Pt recognized step to take  for self w/ exercise by bringing her equipment to work to use during lunch break.  Pt will f/u w/ counselor at next appointment for accountability.  ? ?Interventions: Cognitive Behavioral Therapy and Assertiveness/Communication ? ?Diagnosis:Generalized anxiety disorder ? ?Major depressive  disorder, recurrent episode, moderate ([place]) ? ?Plan: Pt to f/u in 2 weeks for counseling.  Pt to f/u as scheduled w/ PCP. ? 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  ?Client Treatment Preferences  ?biweekly counseling. continue medication management.  ?Client Statement of Needs  ?Pt "mainly to figure out a better way to deal w/ my anxiety as comes up and to communicate my concerns so that not coming off as accusatory and be more confident in myself."  ?Treatment Level  ?outpatient counseling  ?Symptoms  ?Depressed or irritable mood.: No Description Entered (Status: maintained). Excessive and/or unrealistic worry that is difficult to control occurring more days than not for at least 6 months about a number of events or activities.: No Description Entered (Status: maintained). Ineffective communication: No Description Entered (Status: maintained).  ?Problems Addressed  ?Anxiety, Unipolar Depression  ?Goals ?1. Enhance ability to effectively cope with the full variety of life's worries and anxieties. ?Objective ?Learn and implement personal and interpersonal skills to reduce anxiety and improve interpersonal relationships. ?Target Date: 2022-04-11 Frequency: Daily  ?Progress: 0 Modality: individual  ?Objective ?Learn and implement calming skills to reduce overall anxiety and manage anxiety symptoms. ?Target Date: 2022-04-11 Frequency: Daily  ?Progress: 0 Modality: individual  ?Objective ?Identify, challenge, and replace biased, fearful self-talk with positive, realistic, and empowering self-talk. ?Target Date: 2022-04-11 Frequency: Daily  ?Progress: 0 Modality: individual  ?2. Recognize, accept, and cope with feelings of depression. ?Objective ?Identify and replace thoughts and beliefs that support depression. ?Target  Date: 2022-04-11 Frequency:  Daily  ?Progress: 0 Modality: individual  ?Related Interventions ?Conduct Cognitive-Behavioral Therapy (see Cognitive Behavior Therapy by Olevia Bowens; Overcoming Depression by Lynita Lombard al.), beginning with helping the client learn the connection among cognition, depressive feelings, and actions. ?Objective ?Increasingly verbalize hopeful and positive statements regarding self, others, and the future. ?Target Date: 2022-04-11 Frequency: Daily  ?Progress: 0 Modality: individual  ?Related Interventions ?Assign the client to write at least one positive affirmation statement daily regarding himself/herself and the future (or assign "Positive Self-Talk" in the Adult Psychotherapy Homework Planner by Bryn Gulling). ?Pt participated in developing plan and provider verbal consent. ? ? ?Jan Fireman, [facility] ? ? ? ? ? ? ? ? ? ? ? ? ? ? Jan Fireman, [facility] ?

## 2021-06-19 DIAGNOSIS — M25562 Pain in left knee: Secondary | ICD-10-CM | POA: Diagnosis not present

## 2021-06-19 DIAGNOSIS — M1712 Unilateral primary osteoarthritis, left knee: Secondary | ICD-10-CM | POA: Diagnosis not present

## 2021-06-22 ENCOUNTER — Encounter: Payer: Self-pay | Admitting: Family Medicine

## 2021-06-22 ENCOUNTER — Ambulatory Visit: Payer: BC Managed Care – PPO | Admitting: Family Medicine

## 2021-06-22 VITALS — BP 120/82 | HR 96 | Temp 98.3°F | Ht 66.0 in | Wt 328.2 lb

## 2021-06-22 DIAGNOSIS — E559 Vitamin D deficiency, unspecified: Secondary | ICD-10-CM

## 2021-06-22 DIAGNOSIS — E785 Hyperlipidemia, unspecified: Secondary | ICD-10-CM | POA: Diagnosis not present

## 2021-06-22 DIAGNOSIS — E039 Hypothyroidism, unspecified: Secondary | ICD-10-CM

## 2021-06-22 DIAGNOSIS — F3342 Major depressive disorder, recurrent, in full remission: Secondary | ICD-10-CM

## 2021-06-22 DIAGNOSIS — F324 Major depressive disorder, single episode, in partial remission: Secondary | ICD-10-CM | POA: Insufficient documentation

## 2021-06-22 MED ORDER — LEVOTHYROXINE SODIUM 125 MCG PO TABS: ORAL_TABLET | ORAL | 3 refills | Status: DC

## 2021-06-22 NOTE — Progress Notes (Signed)
Phone [phone] In person visit   Subjective:   Kara [name] is a 46 y.o. 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    Priority: High   Restless legs syndrome 04/24/2017    Priority: Medium    Osteoarthritis of both knees 04/19/2013    Priority: Medium    Depression 02/22/2010    Priority: Medium    Morbid obesity ([place]) 06/16/2008    Priority: Medium    Hyperlipidemia 06/11/2007    Priority: Medium    Hypothyroidism 08/18/2006    Priority: Medium    Rotator cuff syndrome of right shoulder 02/27/2018    Priority: Low   Allergic rhinitis 02/03/2014    Priority: Low   Vitamin D deficiency 02/03/2014    Priority: Low   BPPV (benign paroxysmal positional vertigo) 04/19/2013    Priority: Low   DJD (degenerative joint disease) 10/26/2009    Priority: Low    Medications- reviewed and updated Current Outpatient Medications  Medication Sig Dispense Refill   acetaminophen (TYLENOL) 325 MG tablet Take 650 mg by mouth 3 (three) times daily.     calcium carbonate (OS-CAL - DOSED IN MG OF ELEMENTAL CALCIUM) 1250 (500 Ca) MG tablet Take 1 tablet by mouth 3 (three) times daily with meals.     Carbonyl Iron (IRON CHEWS PEDIATRIC PO) Take 1 tablet by mouth daily.     cholecalciferol (VITAMIN D3) 25 MCG (1000 UNIT) tablet Take 5,000 Units by mouth daily.     diazepam (VALIUM) 5 MG tablet Take 1 tablet (5 mg total) by mouth every 12 (twelve) hours as needed (dizziness). 20 tablet 0   gabapentin (NEURONTIN) 600 MG tablet Take 600 mg by mouth 3 (three) times daily. Through [facility]     glucosamine-chondroitin 500-400 MG tablet Take 1 tablet by  mouth daily.     levocetirizine (XYZAL) 5 MG tablet Take 5 mg by mouth every evening.     Melatonin 10 MG TABS Take 10 mg by mouth at bedtime.     Multiple Vitamins-Minerals (BARIATRIC MULTIVITAMINS/IRON PO) Take 1 tablet by mouth 2 (two) times daily.     omeprazole (PRILOSEC) 40 MG capsule Take 1 capsule (40 mg total) by mouth daily. 30 capsule 3   ondansetron (ZOFRAN ODT) 4 MG disintegrating tablet Take 1 tablet (4 mg total) by mouth every 8 (eight) hours as needed for nausea or vomiting. 20 tablet 0   traMADol (ULTRAM) 50 MG tablet TAKE 1 TABLET(50 MG) BY MOUTH THREE TIMES DAILY AS NEEDED 90 tablet 5   venlafaxine XR (EFFEXOR-XR) 150 MG 24 hr capsule TAKE 1 CAPSULE(150 MG) BY MOUTH DAILY WITH BREAKFAST 90 capsule 3   vitamin B-12 (CYANOCOBALAMIN) 500 MCG tablet Take 500 mcg by mouth daily.     levothyroxine (SYNTHROID) 125 MCG tablet take 2 tablets Monday - thursday and 1 tablet 3 other days of the week 145 tablet 3   medroxyPROGESTERone (DEPO-PROVERA) 150 MG/ML injection Inject 150 mg into the muscle every 3 (three) months. Last dose was 12/10/12 (Patient not taking: Reported on 12/13/2020)     terbinafine (LAMISIL) 250 MG tablet Take 1 tablet (250 mg total) by  mouth daily. For toenail fungus/onychomycosis 90 tablet 0   No current facility-administered medications for this visit.     Objective:  BP 120/82   Pulse 96   Temp 98.3 F (36.8 C)   Ht _0  (1.676 m)   Wt (!) 328 lb 3.2 oz (148.9 kg)   SpO2 96%   BMI 52.97 kg/m  Gen: NAD, resting comfortably CV: RRR no murmurs rubs or gallops Lungs: CTAB no crackles, wheeze, rhonchi Ext: 1+ stable edema Skin: warm, dry      Assessment and Plan   #hyperlipidemia S: Medication:None The 10-year ASCVD risk score (Arnett DK, et al., 2019) is: 1%  Lab Results  Component Value Date   CHOL 203 (H) 06/02/2020   HDL 50.90 06/02/2020   LDLCALC 132 (H) 06/02/2020   LDLDIRECT 96.0 05/23/2014   TRIG 103.0 06/02/2020   CHOLHDL 4  06/02/2020    A/P: low ascvd risk in past- update lipids and recalculate   # Depression S: Medication: Venlafaxine 149m  Daily -Symptoms were worse with prior employer -Celexa was not effective.  Has done therapy with behavioral health with [place]-working with them at present    06/22/2021    4:00 PM 12/13/2020    7:56 AM 06/02/2020    8:05 AM  Depression screen PHQ 2/9  Decreased Interest 0 0 0  Down, Depressed, Hopeless 0 0 0  PHQ - 2 Score 0 0 0  Altered sleeping  0 0  Tired, decreased energy  0 0  Change in appetite  2 0  Feeling bad or failure about yourself   0 0  Trouble concentrating  0 0  Moving slowly or fidgety/restless  0 0  Suicidal thoughts  0 0  PHQ-9 Score  2 0  Difficult doing work/chores  Not difficult at all Not difficult at all  A/P: Reports full remission-continue current medication  #hypothyroidism S: compliant On thyroid medication- Levothyroxine 1252m take 2 tablets Monday - thursday and 1 tablet 3 other days of the week Lab Results  Component Value Date   TSH 0.84 06/02/2020  A/P:hopefully stable- update tsh today. Continue current meds for now   #Vitamin D deficiency/history of high PTH-improving on calcium and vitamin D S: Medication: 10000 units daily Lab Results  Component Value Date   VD25OH 31.62 06/02/2020  A/P: stable last year- update vitamin D with labs  #Left knee osteoarthritis S: Patient follows with Dr. DuSharol Given We have at times done injections for her most recently November 30, 2019. -Oral NSAIDs not ideal with gastric bypass -Uses a combination of tramadol and Tylenol- still finding helpful (we refilled in march for 6 months).  Aspercreme has worked better for her than Voltaren gel  Recent had 25 cc of fluid pulled off left knee- and had steroid injection A/P: overall stable after recent arthrocentesis and steroid injection  -also has some pain in her feet she is going to see DR. Patel for (mainly pain management at their center)-  doing walking boot right now on right now that left knee is better  #Restless legs- medication: Gabapentin 60040mID through spine and scoliosis center. 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 Not tolerating brown rice either -going to restart her Cubi -hard recently due to protein barrier- tolerating premier protein in AM sometimes with poptart- discussed substituting for piece of fruit Wt Readings  from Last 3 Encounters:  06/22/21 (!) 328 lb 3.2 oz (148.9 kg)  12/13/20 (!) 327 lb 6.4 oz (148.5 kg)  06/02/20 (!) 315 lb 6.4 oz (143.1 kg)  Encouraged need for healthy eating, regular exercise, weight loss.  - discussed could follow up with prior surgeons for their opinion but unfortunately somewhat expensive  Recommended follow up: Return in about 6 months (around 12/23/2021) for physical or sooner if needed.Schedule b4 you leave. Future Appointments  Date Time Provider [place]  06/28/2021 12:00 PM Nelson Chimes, [facility] None  07/10/2021 12:00 PM Nelson Chimes, [facility] None  07/24/2021 12:00 PM Nelson Chimes, [facility] None  08/07/2021 12:00 PM Nelson Chimes, [facility] None    Lab/Order associations: NOT fasting- had mac and cheese bowl for lunch   ICD-10-CM   1. Hypothyroidism, unspecified type  E03.9 TSH    CANCELED: TSH    2. Hyperlipidemia, unspecified hyperlipidemia type  E78.5 Lipid panel    Comprehensive metabolic panel    CBC with Differential/Platelet    CANCELED: CBC with Differential/Platelet    CANCELED: Comprehensive metabolic panel    CANCELED: Lipid panel    3. Vitamin D deficiency  E55.9 VITAMIN D 25 Hydroxy (Vit-D Deficiency, Fractures)    CANCELED: VITAMIN D 25 Hydroxy (Vit-D Deficiency, Fractures)      Meds ordered this encounter  Medications   levothyroxine  (SYNTHROID) 125 MCG tablet    Sig: take 2 tablets Monday - thursday and 1 tablet 3 other days of the week    Dispense:  145 tablet    Refill:  3    Return precautions advised.  Garret Reddish, MD

## 2021-06-22 NOTE — Patient Instructions (Addendum)
Health Maintenance Due  Topic Date Due   PAP SMEAR-Modifier  05/17/2021  Get updated pap and send [name] a copy  Recommended follow up: Return in about 6 months (around 12/23/2021) for physical or sooner if needed.Schedule b4 you leave.

## 2021-06-23 LAB — LIPID PANEL
Cholesterol: 211 mg/dL — ABNORMAL HIGH (ref <200)
HDL: 53 mg/dL (ref >=50)
LDL Cholesterol (Calc): 132 mg/dL (calc) — ABNORMAL HIGH
Non-HDL Cholesterol (Calc): 158 mg/dL (calc) — ABNORMAL HIGH (ref <130)
Total CHOL/HDL Ratio: 4 (calc) (ref <5.0)
Triglycerides: 151 mg/dL — ABNORMAL HIGH (ref <150)

## 2021-06-23 LAB — CBC WITH DIFFERENTIAL/PLATELET
Absolute Monocytes: 873 cells/uL (ref 200–950)
Basophils Absolute: 29 cells/uL (ref 0–200)
Basophils Relative: 0.3 %
Eosinophils Absolute: 475 cells/uL (ref 15–500)
Eosinophils Relative: 4.9 %
HCT: 38.9 % (ref 35.0–45.0)
Hemoglobin: 13.4 g/dL (ref 11.7–15.5)
Lymphs Abs: 2483 cells/uL (ref 850–3900)
MCH: 30.5 pg (ref 27.0–33.0)
MCHC: 34.4 g/dL (ref 32.0–36.0)
MCV: 88.4 fL (ref 80.0–100.0)
MPV: 10.7 fL (ref 7.5–12.5)
Monocytes Relative: 9 %
Neutro Abs: 5839 cells/uL (ref 1500–7800)
Neutrophils Relative %: 60.2 %
Platelets: 381 10*3/uL (ref 140–400)
RBC: 4.4 10*6/uL (ref 3.80–5.10)
RDW: 12.6 % (ref 11.0–15.0)
Total Lymphocyte: 25.6 %
WBC: 9.7 10*3/uL (ref 3.8–10.8)

## 2021-06-23 LAB — COMPREHENSIVE METABOLIC PANEL
AG Ratio: 1.4 (calc) (ref 1.0–2.5)
ALT: 18 U/L (ref 6–29)
AST: 14 U/L (ref 10–35)
Albumin: 4 g/dL (ref 3.6–5.1)
Alkaline phosphatase (APISO): 70 U/L (ref 31–125)
BUN: 20 mg/dL (ref 7–25)
CO2: 21 mmol/L (ref 20–32)
Calcium: 8.9 mg/dL (ref 8.6–10.2)
Chloride: 105 mmol/L (ref 98–110)
Creat: 0.57 mg/dL (ref 0.50–0.99)
Globulin: 2.8 g/dL (calc) (ref 1.9–3.7)
Glucose, Bld: 84 mg/dL (ref 65–99)
Potassium: 4.2 mmol/L (ref 3.5–5.3)
Sodium: 137 mmol/L (ref 135–146)
Total Bilirubin: 0.4 mg/dL (ref 0.2–1.2)
Total Protein: 6.8 g/dL (ref 6.1–8.1)

## 2021-06-23 LAB — VITAMIN D 25 HYDROXY (VIT D DEFICIENCY, FRACTURES): Vit D, 25-Hydroxy: 27 ng/mL — ABNORMAL LOW (ref 30–100)

## 2021-06-23 LAB — TSH: TSH: 1.33 mIU/L

## 2021-06-28 ENCOUNTER — Ambulatory Visit (INDEPENDENT_AMBULATORY_CARE_PROVIDER_SITE_OTHER): Payer: Self-pay | Admitting: Psychology

## 2021-06-28 DIAGNOSIS — F411 Generalized anxiety disorder: Secondary | ICD-10-CM

## 2021-06-28 DIAGNOSIS — F331 Major depressive disorder, recurrent, moderate: Secondary | ICD-10-CM

## 2021-06-28 NOTE — Progress Notes (Signed)
[place] Counselor/Therapist Progress Note  Patient ID: Kara [name], MRN: [id number],    Date: 06/28/2021  Time Spent: 12:00pm-12:51pm   Treatment Type: Individual Therapy  Pt is seen for a virtual video visit via caregility.  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    Reported Symptoms:  stressors w/ work, overwhelmed  Mental Status Exam: Appearance:  Well Groomed     Behavior: Appropriate  Motor: Normal  Speech/Language:  Normal Rate  Affect: Appropriate  Mood: normal  Thought process: normal  Thought content:   WNL  Sensory/Perceptual disturbances:   WNL  Orientation: oriented to person, place, time/date, and situation  Attention: Good  Concentration: Good  Memory: WNL  Fund of knowledge:  Good  Insight:   Good  Judgment:  Good  Impulse Control: Good   Risk Assessment: Danger to Self:  No Self-injurious Behavior: No Danger to Others: No Duty to Warn:no Physical Aggression / Violence:No  Access to Firearms a concern: No  Gang Involvement:No   Subjective: Counselor assessed pt current functioning per pt report.  Processed w/pt stressors and positives.  Reflected follow through on her goals for self care.  Explored feeling lack of support at work and asserting her needs to better managed stress.   Discussed interactions at home and reframing "what if" thinking.   Pt affect wnl.  Pt was excited to share that she has brought her exercise equipment to work and has started w/ that.  Pt reported she has felt more overwhelmed w/ work as not being supported by coworkers at work.  Pt discussed impact having and recognized that needs to advocate for self.  Pt did have crying break down last night at home from stress.   Pt reported that her partner started new job and she has been more quiet.  Pt identified what if thinking that something wrong and was able to reframe.    Interventions: Cognitive Behavioral Therapy and  Assertiveness/Communication  Diagnosis:Generalized anxiety disorder  Major depressive disorder, recurrent episode, moderate ([facility])  Plan: Pt to f/u in 2 weeks for counseling.  Pt to f/u as scheduled w/ PCP.  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  Client Treatment Preferences  biweekly counseling. continue medication management.  Client Statement of Needs  Pt "mainly to figure out a better way to deal w/ my anxiety as comes up and to communicate my concerns so that not coming off as accusatory and be more confident in myself."  Treatment Level  outpatient counseling  Symptoms  Depressed or irritable mood.: No Description Entered (Status: maintained). Excessive and/or unrealistic worry that is difficult to control occurring more days than not for at least 6 months about a number of events or activities.: No Description Entered (Status: maintained). Ineffective communication: No Description Entered (Status: maintained).  Problems Addressed  Anxiety, Unipolar Depression  Goals 1. Enhance ability to effectively cope with the full variety of life's worries and anxieties. Objective Learn and implement personal and interpersonal skills to reduce anxiety and improve interpersonal relationships. Target Date: 2022-04-11 Frequency: Daily  Progress: 0 Modality: individual  Objective Learn and implement calming skills to reduce overall anxiety and manage anxiety symptoms. Target Date: 2022-04-11 Frequency: Daily  Progress: 0 Modality: individual  Objective Identify, challenge, and replace biased, fearful self-talk with positive, realistic, and empowering self-talk. Target Date: 2022-04-11 Frequency: Daily  Progress: 0 Modality: individual  2. Recognize, accept, and cope with feelings of depression. Objective Identify and  replace thoughts and beliefs that support depression. Target Date: 2022-04-11 Frequency: Daily  Progress: 0 Modality: individual  Related Interventions Conduct Cognitive-Behavioral Therapy (see Cognitive Behavior Therapy by Olevia Bowens; Overcoming Depression by Lynita Lombard al.), beginning with helping the client learn the connection among cognition, depressive feelings, and actions. Objective Increasingly verbalize hopeful and positive statements regarding self, others, and the future. Target Date: 2022-04-11 Frequency: Daily  Progress: 0 Modality: individual  Related Interventions Assign the client to write at least one positive affirmation statement daily regarding himself/herself and the future (or assign "Positive Self-Talk" in the Adult Psychotherapy Homework Planner by Bryn Gulling). Pt participated in developing plan and provider verbal consent.     Jan Fireman, [facility]

## 2021-07-10 ENCOUNTER — Ambulatory Visit (INDEPENDENT_AMBULATORY_CARE_PROVIDER_SITE_OTHER): Payer: Self-pay | Admitting: Psychology

## 2021-07-10 DIAGNOSIS — F331 Major depressive disorder, recurrent, moderate: Secondary | ICD-10-CM

## 2021-07-10 DIAGNOSIS — F411 Generalized anxiety disorder: Secondary | ICD-10-CM

## 2021-07-10 NOTE — Progress Notes (Signed)
[place] Counselor/Therapist Progress Note  Patient ID: Kara [name], MRN: [id number],    Date: 07/10/2021  Time Spent: 12:00pm-12:59pm   Treatment Type: Individual Therapy  Pt is seen for a virtual video visit via caregility.  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    Reported Symptoms:  anxiety at recent social event  Mental Status Exam: Appearance:  Well Groomed     Behavior: Appropriate  Motor: Normal  Speech/Language:  Normal Rate  Affect: Appropriate  Mood: normal  Thought process: normal  Thought content:   WNL  Sensory/Perceptual disturbances:   WNL  Orientation: oriented to person, place, time/date, and situation  Attention: Good  Concentration: Good  Memory: WNL  Fund of knowledge:  Good  Insight:   Good  Judgment:  Good  Impulse Control: Good   Risk Assessment: Danger to Self:  No Self-injurious Behavior: No Danger to Others: No Duty to Warn:no Physical Aggression / Violence:No  Access to Firearms a concern: No  Gang Involvement:No   Subjective: Counselor assessed pt current functioning per pt report.  Processed w/pt stressors and positives.  Reflected positive follow through w/ self care and asserting needs at work.  .  Explored anxiety at recent outing and discussed contributing factors.  Discussed coping skills to manager through anxiety w/ grounding, supports and encouraging self statements.   Pt affect wnl.  Pt reported continuing w/ working out during breaks.  Pt reported was able to assert concerns at work and acknowledged benefit of setting boundaries and can't make everyone happy.  Pt discussed anxiety and how impacting w/ stomach upset at recent dinner w/ partner and her work contacts.  Pt was able to identify thoughts that contributing and reframes made.  Pt receptive to taking time for relaxing and grounding system and seeking support as needed.     Interventions: Cognitive Behavioral Therapy and  Assertiveness/Communication  Diagnosis:Generalized anxiety disorder  Major depressive disorder, recurrent episode, moderate (HCC)  Plan: Pt to f/u in 2 weeks for counseling.  Pt to f/u as scheduled w/ PCP.  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  Client Treatment Preferences  biweekly counseling. continue medication management.  Client Statement of Needs  Pt "mainly to figure out a better way to deal w/ my anxiety as comes up and to communicate my concerns so that not coming off as accusatory and be more confident in myself."  Treatment Level  outpatient counseling  Symptoms  Depressed or irritable mood.: No Description Entered (Status: maintained). Excessive and/or unrealistic worry that is difficult to control occurring more days than not for at least 6 months about a number of events or activities.: No Description Entered (Status: maintained). Ineffective communication: No Description Entered (Status: maintained).  Problems Addressed  Anxiety, Unipolar Depression  Goals 1. Enhance ability to effectively cope with the full variety of life's worries and anxieties. Objective Learn and implement personal and interpersonal skills to reduce anxiety and improve interpersonal relationships. Target Date: 2022-04-11 Frequency: Daily  Progress: 0 Modality: individual  Objective Learn and implement calming skills to reduce overall anxiety and manage anxiety symptoms. Target Date: 2022-04-11 Frequency: Daily  Progress: 0 Modality: individual  Objective Identify, challenge, and replace biased, fearful self-talk with positive, realistic, and empowering self-talk. Target Date: 2022-04-11 Frequency: Daily  Progress: 0 Modality: individual  2. Recognize, accept, and cope with feelings  of depression. Objective Identify and  replace thoughts and beliefs that support depression. Target Date: 2022-04-11 Frequency: Daily  Progress: 0 Modality: individual  Related Interventions Conduct Cognitive-Behavioral Therapy (see Cognitive Behavior Therapy by Olevia Bowens; Overcoming Depression by Lynita Lombard al.), beginning with helping the client learn the connection among cognition, depressive feelings, and actions. Objective Increasingly verbalize hopeful and positive statements regarding self, others, and the future. Target Date: 2022-04-11 Frequency: Daily  Progress: 0 Modality: individual  Related Interventions Assign the client to write at least one positive affirmation statement daily regarding himself/herself and the future (or assign "Positive Self-Talk" in the Adult Psychotherapy Homework Planner by Bryn Gulling). Pt participated in developing plan and provider verbal consent.     Jan Fireman, [facility]

## 2021-07-13 DIAGNOSIS — M545 Low back pain, unspecified: Secondary | ICD-10-CM | POA: Diagnosis not present

## 2021-07-17 ENCOUNTER — Encounter: Payer: Self-pay | Admitting: Family Medicine

## 2021-07-17 ENCOUNTER — Other Ambulatory Visit: Payer: Self-pay

## 2021-07-17 DIAGNOSIS — R197 Diarrhea, unspecified: Secondary | ICD-10-CM

## 2021-07-17 DIAGNOSIS — R110 Nausea: Secondary | ICD-10-CM

## 2021-07-24 ENCOUNTER — Ambulatory Visit (INDEPENDENT_AMBULATORY_CARE_PROVIDER_SITE_OTHER): Payer: Self-pay | Admitting: Psychology

## 2021-07-24 DIAGNOSIS — F331 Major depressive disorder, recurrent, moderate: Secondary | ICD-10-CM

## 2021-07-24 DIAGNOSIS — F411 Generalized anxiety disorder: Secondary | ICD-10-CM

## 2021-08-07 ENCOUNTER — Ambulatory Visit (INDEPENDENT_AMBULATORY_CARE_PROVIDER_SITE_OTHER): Payer: Self-pay | Admitting: Psychology

## 2021-08-07 DIAGNOSIS — F331 Major depressive disorder, recurrent, moderate: Secondary | ICD-10-CM

## 2021-08-07 DIAGNOSIS — F411 Generalized anxiety disorder: Secondary | ICD-10-CM

## 2021-08-07 NOTE — Progress Notes (Signed)
[facility] Counselor/Therapist Progress Note  Patient ID: Kara [name], MRN: [id number],    Date: 08/07/2021  Time Spent: 12:01pm-12:58pm   Treatment Type: Individual Therapy  Pt is seen for a virtual video visit via caregility.  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    Reported Symptoms:  stress w/ roommate and communication w/ partner  Mental Status Exam: Appearance:  Well Groomed     Behavior: Appropriate  Motor: Normal  Speech/Language:  Normal Rate  Affect: Appropriate  Mood: anxious  Thought process: normal  Thought content:   WNL  Sensory/Perceptual disturbances:   WNL  Orientation: oriented to person, place, time/date, and situation  Attention: Good  Concentration: Good  Memory: WNL  Fund of knowledge:  Good  Insight:   Good  Judgment:  Good  Impulse Control: Good   Risk Assessment: Danger to Self:  No Self-injurious Behavior: No Danger to Others: No Duty to Warn:no Physical Aggression / Violence:No  Access to Firearms a concern: No  Gang Involvement:No   Subjective: Counselor assessed pt current functioning per pt report.  Processed w/pt stressors and positives.  Explored conflict resolution and effective communication.  Explored having household meeting to address concerns w/ roommate.  Discussed ways of intentional planning for time together.  Pt affect congruent w/ report of stress and anxiety of overwhelmed w/ roommate not meeting agreement set and partner not following through w/ communicating this.  Pt acknowledging how impacting their interactions.  Pt identified plan for communication w/ partner and household meeting.   Interventions: Cognitive Behavioral Therapy and Assertiveness/Communication  Diagnosis:Generalized anxiety disorder  Major depressive disorder, recurrent episode, moderate (HCC)  Plan: Pt to f/u in 2 weeks for counseling.  Pt to f/u as scheduled w/ PCP.  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  Client Treatment Preferences  biweekly counseling. continue medication management.  Client Statement of Needs  Pt "mainly to figure out a better way to deal w/ my anxiety as comes up and to communicate my concerns so that not coming off as accusatory and be more confident in myself."  Treatment Level  outpatient counseling  Symptoms  Depressed or irritable mood.: No Description Entered (Status: maintained). Excessive and/or unrealistic worry that is difficult to control occurring more days than not for at least 6 months about a number of events or activities.: No Description Entered (Status: maintained). Ineffective communication: No Description Entered (Status: maintained).  Problems Addressed  Anxiety, Unipolar Depression  Goals 1. Enhance ability to effectively cope with the full variety of life's worries and anxieties. Objective Learn and implement personal and interpersonal skills to reduce anxiety and improve interpersonal relationships. Target Date: 2022-04-11 Frequency: Daily  Progress: 0 Modality: individual  Objective Learn and implement calming skills to reduce overall anxiety and manage anxiety symptoms. Target Date: 2022-04-11 Frequency: Daily  Progress: 0 Modality: individual  Objective Identify, challenge, and replace biased, fearful self-talk with positive, realistic, and empowering self-talk. Target Date: 2022-04-11 Frequency: Daily  Progress: 0 Modality: individual  2. Recognize, accept, and cope with feelings of depression. Objective Identify and replace thoughts and beliefs that support depression. Target Date: 2022-04-11 Frequency: Daily  Progress: 0 Modality: individual  Related Interventions Conduct Cognitive-Behavioral Therapy (see Cognitive Behavior Therapy by Reola Calkins;  Overcoming Depression by Agapito Games al.), beginning with helping the client learn the connection among cognition,  depressive feelings, and actions. Objective Increasingly verbalize hopeful and positive statements regarding self, others, and the future. Target Date: 2022-04-11 Frequency: Daily  Progress: 0 Modality: individual  Related Interventions Assign the client to write at least one positive affirmation statement daily regarding himself/herself and the future (or assign "Positive Self-Talk" in the Adult Psychotherapy Homework Planner by Stephannie Li). Pt participated in developing plan and provider verbal consent.        Forde Radon, [facility]

## 2021-08-14 ENCOUNTER — Encounter: Payer: Self-pay | Admitting: Family Medicine

## 2021-08-21 ENCOUNTER — Ambulatory Visit (INDEPENDENT_AMBULATORY_CARE_PROVIDER_SITE_OTHER): Payer: Managed Care, Other (non HMO) | Admitting: Psychology

## 2021-08-21 DIAGNOSIS — F411 Generalized anxiety disorder: Secondary | ICD-10-CM | POA: Diagnosis not present

## 2021-08-21 DIAGNOSIS — F331 Major depressive disorder, recurrent, moderate: Secondary | ICD-10-CM | POA: Diagnosis not present

## 2021-08-21 NOTE — Progress Notes (Signed)
[facility] Counselor/Therapist Progress Note  Patient ID: Kara [name], MRN: [id number],    Date: 08/21/2021  Time Spent: 12:02pm-1:03pm   Treatment Type: Individual Therapy  Pt is seen for a virtual video visit via caregility.  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    Reported Symptoms:  stressors w/ roommate and partner's anxiety  Mental Status Exam: Appearance:  Well Groomed     Behavior: Appropriate  Motor: Normal  Speech/Language:  Normal Rate  Affect: Appropriate  Mood: normal  Thought process: normal  Thought content:   WNL  Sensory/Perceptual disturbances:   WNL  Orientation: oriented to person, place, time/date, and situation  Attention: Good  Concentration: Good  Memory: WNL  Fund of knowledge:  Good  Insight:   Good  Judgment:  Good  Impulse Control: Good   Risk Assessment: Danger to Self:  No Self-injurious Behavior: No Danger to Others: No Duty to Warn:no Physical Aggression / Violence:No  Access to Firearms a concern: No  Gang Involvement:No   Subjective: Counselor assessed pt current functioning per pt report.  Processed w/pt stressors and pt mood.  Explored how pt has been managing w/ use of breathing and focus on what is in her control.  Discussed support to her partner through her stressors and anxiety.  Discussed continued struggles w/ roommate and establishing boundaries together.   Pt affect wnl.  Pt reported weekend stressful w/ roommate actions and how impacted her and partner.  Pt reported that partner now recognizing toxic relationship.  Pt reports on how she managed through stress of partner emotional escalation and able to support.  Pt reported focused on establishing boundaries together.  Pt discussed stressor upcoming w/ partner going out of town for work and being intentional about self care that week.    Interventions: Cognitive Behavioral Therapy and  Assertiveness/Communication  Diagnosis:Generalized anxiety disorder  Major depressive disorder, recurrent episode, moderate (HCC)  Plan: Pt to f/u in 2 weeks for counseling.  Pt to f/u as scheduled w/ PCP.  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  Client Treatment Preferences  biweekly counseling. continue medication management.  Client Statement of Needs  Pt "mainly to figure out a better way to deal w/ my anxiety as comes up and to communicate my concerns so that not coming off as accusatory and be more confident in myself."  Treatment Level  outpatient counseling  Symptoms  Depressed or irritable mood.: No Description Entered (Status: maintained). Excessive and/or unrealistic worry that is difficult to control occurring more days than not for at least 6 months about a number of events or activities.: No Description Entered (Status: maintained). Ineffective communication: No Description Entered (Status: maintained).  Problems Addressed  Anxiety, Unipolar Depression  Goals 1. Enhance ability to effectively cope with the full variety of life's worries and anxieties. Objective Learn and implement personal and interpersonal skills to reduce anxiety and improve interpersonal relationships. Target Date: 2022-04-11 Frequency: Daily  Progress: 0 Modality: individual  Objective Learn and implement calming skills to reduce overall anxiety and manage anxiety symptoms. Target Date: 2022-04-11 Frequency: Daily  Progress: 0 Modality: individual  Objective Identify, challenge, and replace biased, fearful self-talk with positive, realistic, and empowering self-talk. Target Date: 2022-04-11 Frequency: Daily  Progress: 0 Modality: individual  2. Recognize, accept, and cope with feelings of depression. Objective Identify and  replace thoughts and beliefs that support depression. Target Date: 2022-04-11 Frequency: Daily  Progress: 0 Modality: individual  Related Interventions Conduct Cognitive-Behavioral Therapy (see Cognitive Behavior Therapy by Reola Calkins; Overcoming Depression by Agapito Games al.), beginning with helping the client learn the connection among cognition, depressive feelings, and actions. Objective Increasingly verbalize hopeful and positive statements regarding self, others, and the future. Target Date: 2022-04-11 Frequency: Daily  Progress: 0 Modality: individual  Related Interventions Assign the client to write at least one positive affirmation statement daily regarding himself/herself and the future (or assign "Positive Self-Talk" in the Adult Psychotherapy Homework Planner by Stephannie Li). Pt participated in developing plan and provider verbal consent.     Forde Radon, [facility]

## 2021-09-04 ENCOUNTER — Ambulatory Visit (INDEPENDENT_AMBULATORY_CARE_PROVIDER_SITE_OTHER): Payer: Self-pay | Admitting: Psychology

## 2021-09-04 DIAGNOSIS — F331 Major depressive disorder, recurrent, moderate: Secondary | ICD-10-CM

## 2021-09-04 DIAGNOSIS — F411 Generalized anxiety disorder: Secondary | ICD-10-CM

## 2021-09-04 NOTE — Progress Notes (Signed)
[facility] Counselor/Therapist Progress Note  Patient ID: Kara [name], MRN: [id number],    Date: 09/04/2021  Time Spent: 12:12pm-1:03pm   Treatment Type: Individual Therapy  Pt is seen for a virtual video visit via caregility.  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    Reported Symptoms:  coping well w/ partner traveling  Mental Status Exam: Appearance:  Well Groomed     Behavior: Appropriate  Motor: Normal  Speech/Language:  Normal Rate  Affect: Appropriate  Mood: normal  Thought process: normal  Thought content:   WNL  Sensory/Perceptual disturbances:   WNL  Orientation: oriented to person, place, time/date, and situation  Attention: Good  Concentration: Good  Memory: WNL  Fund of knowledge:  Good  Insight:   Good  Judgment:  Good  Impulse Control: Good   Risk Assessment: Danger to Self:  No Self-injurious Behavior: No Danger to Others: No Duty to Warn:no Physical Aggression / Violence:No  Access to Firearms a concern: No  Gang Involvement:No   Subjective: Counselor assessed pt current functioning per pt report.  Processed w/pt stress of partner traveling and how has managed well w/ this stressors.  Assisted in acknowledging contributing factors.  Reflected positives of being assertive w/ interactions at work and w/ roommate.    Pt affect wnl.  Pt reported that her wife is away for travel and that she feels she is coping better than in past.  Pt reports decreased anxiety this time.  Pt acknowledges that feeling more secure and better communication has helped.  Pt reported that she has been able to assert during conflict at work and w/ requesting roommate to f/u w/ responsibilities.     Interventions: Cognitive Behavioral Therapy and Assertiveness/Communication  Diagnosis:Generalized anxiety disorder  Major depressive disorder, recurrent episode, moderate (HCC)  Plan: Pt to f/u in 2 weeks for counseling.  Pt to  f/u as scheduled w/ PCP.  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  Client Treatment Preferences  biweekly counseling. continue medication management.  Client Statement of Needs  Pt "mainly to figure out a better way to deal w/ my anxiety as comes up and to communicate my concerns so that not coming off as accusatory and be more confident in myself."  Treatment Level  outpatient counseling  Symptoms  Depressed or irritable mood.: No Description Entered (Status: maintained). Excessive and/or unrealistic worry that is difficult to control occurring more days than not for at least 6 months about a number of events or activities.: No Description Entered (Status: maintained). Ineffective communication: No Description Entered (Status: maintained).  Problems Addressed  Anxiety, Unipolar Depression  Goals 1. Enhance ability to effectively cope with the full variety of life's worries and anxieties. Objective Learn and implement personal and interpersonal skills to reduce anxiety and improve interpersonal relationships. Target Date: 2022-04-11 Frequency: Daily  Progress: 0 Modality: individual  Objective Learn and implement calming skills to reduce overall anxiety and manage anxiety symptoms. Target Date: 2022-04-11 Frequency: Daily  Progress: 0 Modality: individual  Objective Identify, challenge, and replace biased, fearful self-talk with positive, realistic, and empowering self-talk. Target Date: 2022-04-11 Frequency: Daily  Progress: 0 Modality: individual  2. Recognize, accept, and cope with feelings of depression. Objective Identify and replace thoughts and beliefs that support depression. Target Date: 2022-04-11 Frequency: Daily  Progress: 0 Modality: individual  Related Interventions  Conduct Cognitive-Behavioral  Therapy (see Cognitive Behavior Therapy by Reola Calkins; Overcoming Depression by Agapito Games al.), beginning with helping the client learn the connection among cognition, depressive feelings, and actions. Objective Increasingly verbalize hopeful and positive statements regarding self, others, and the future. Target Date: 2022-04-11 Frequency: Daily  Progress: 0 Modality: individual  Related Interventions Assign the client to write at least one positive affirmation statement daily regarding himself/herself and the future (or assign "Positive Self-Talk" in the Adult Psychotherapy Homework Planner by Stephannie Li). Pt participated in developing plan and provider verbal consent.     Forde Radon [facility]               [place], [facility]

## 2021-09-04 NOTE — Progress Notes (Deleted)
? ? ? ? ? ? ? ? ? ? ? ? ? ? ?  Callaghan Laverdure, LCMHC ?

## 2021-09-18 ENCOUNTER — Ambulatory Visit (INDEPENDENT_AMBULATORY_CARE_PROVIDER_SITE_OTHER): Payer: Self-pay | Admitting: Psychology

## 2021-09-18 DIAGNOSIS — F331 Major depressive disorder, recurrent, moderate: Secondary | ICD-10-CM

## 2021-09-18 DIAGNOSIS — F411 Generalized anxiety disorder: Secondary | ICD-10-CM

## 2021-09-18 NOTE — Progress Notes (Signed)
[facility] Counselor/Therapist Progress Note  Patient ID: Kara [name], MRN: [id number],    Date: 09/18/2021  Time Spent: 12:01pm-12:46pm   Treatment Type: Individual Therapy  Pt is seen for a virtual video visit via caregility.  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    Reported Symptoms:  grief w/ death of her pet, stressors w/ health, positive interactions w/ partner  Mental Status Exam: Appearance:  Well Groomed     Behavior: Appropriate  Motor: Normal  Speech/Language:  Normal Rate  Affect: Appropriate  Mood: normal  Thought process: normal  Thought content:   WNL  Sensory/Perceptual disturbances:   WNL  Orientation: oriented to person, place, time/date, and situation  Attention: Good  Concentration: Good  Memory: WNL  Fund of knowledge:  Good  Insight:   Good  Judgment:  Good  Impulse Control: Good   Risk Assessment: Danger to Self:  No Self-injurious Behavior: No Danger to Others: No Duty to Warn:no Physical Aggression / Violence:No  Access to Firearms a concern: No  Gang Involvement:No   Subjective: Counselor assessed pt current functioning per pt report.  Processed w/pt grief and stressors.  Reflected positive of being able to ground self and allow partner space for more effective conflict resolution.   Validated and normalized grief reaction and giving space for expressing feelings.  Discussed boundaries setting w/ roommate.  Pt affect congruent w/ report of sadness/grief w/ death of pet in past week.  Pt tearful at times. Pt reported that her partner had emotional breakdown and stormed off and pt expressed feeling good about not going to worst case scenario and pushing to talk.  Pt discussed what she focused on to ground and that partner later was able to talk w/ her and express emotions and resolve together. Pt did report on appointment w/ urologist and more procedures to r/o tumors.    Interventions:  Cognitive Behavioral Therapy and Assertiveness/Communication  Diagnosis:Generalized anxiety disorder  Major depressive disorder, recurrent episode, moderate (HCC)  Plan: Pt to f/u in 2 weeks for counseling.  Pt to f/u as scheduled w/ PCP and urologist.  Pt reports will f/u w/ [place] to correct insurance on file and f/u w/ [facility] pt accounting.    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  Client Treatment Preferences  biweekly counseling. continue medication management.  Client Statement of Needs  Pt "mainly to figure out a better way to deal w/ my anxiety as comes up and to communicate my concerns so that not coming off as accusatory and be more confident in myself."  Treatment Level  outpatient counseling  Symptoms  Depressed or irritable mood.: No Description Entered (Status: maintained). Excessive and/or unrealistic worry that is difficult to control occurring more days than not for at least 6 months about a number of events or activities.: No Description Entered (Status: maintained). Ineffective communication: No Description Entered (Status: maintained).  Problems Addressed  Anxiety, Unipolar Depression  Goals 1. Enhance ability to effectively cope with the full variety of life's worries and anxieties. Objective Learn and implement personal and interpersonal skills to reduce anxiety and improve interpersonal relationships. Target Date: 2022-04-11 Frequency: Daily  Progress: 0 Modality: individual  Objective Learn and implement calming skills to reduce overall anxiety and manage anxiety symptoms. Target Date: 2022-04-11 Frequency: Daily  Progress: 0 Modality: individual  Objective Identify,  challenge, and replace biased, fearful self-talk with positive, realistic, and empowering self-talk. Target Date: 2022-04-11  Frequency: Daily  Progress: 0 Modality: individual  2. Recognize, accept, and cope with feelings of depression. Objective Identify and replace thoughts and beliefs that support depression. Target Date: 2022-04-11 Frequency: Daily  Progress: 0 Modality: individual  Related Interventions Conduct Cognitive-Behavioral Therapy (see Cognitive Behavior Therapy by Reola Calkins; Overcoming Depression by Agapito Games al.), beginning with helping the client learn the connection among cognition, depressive feelings, and actions. Objective Increasingly verbalize hopeful and positive statements regarding self, others, and the future. Target Date: 2022-04-11 Frequency: Daily  Progress: 0 Modality: individual  Related Interventions Assign the client to write at least one positive affirmation statement daily regarding himself/herself and the future (or assign "Positive Self-Talk" in the Adult Psychotherapy Homework Planner by Stephannie Li). Pt participated in developing plan and provider verbal consent.   Forde Radon, [facility]

## 2021-10-02 ENCOUNTER — Ambulatory Visit: Payer: 59 | Admitting: Psychology

## 2021-10-05 ENCOUNTER — Other Ambulatory Visit: Payer: Self-pay | Admitting: Family Medicine

## 2021-10-07 ENCOUNTER — Encounter: Payer: Self-pay | Admitting: Family Medicine

## 2021-10-08 ENCOUNTER — Other Ambulatory Visit: Payer: Self-pay

## 2021-10-16 ENCOUNTER — Ambulatory Visit: Payer: Managed Care, Other (non HMO) | Admitting: Psychology

## 2021-10-19 ENCOUNTER — Ambulatory Visit (INDEPENDENT_AMBULATORY_CARE_PROVIDER_SITE_OTHER): Payer: 59 | Admitting: Psychology

## 2021-10-19 DIAGNOSIS — F331 Major depressive disorder, recurrent, moderate: Secondary | ICD-10-CM | POA: Diagnosis not present

## 2021-10-19 DIAGNOSIS — F411 Generalized anxiety disorder: Secondary | ICD-10-CM

## 2021-10-19 NOTE — Progress Notes (Signed)
[facility] Counselor/Therapist Progress Note  Patient ID: Kara [name], MRN: [id number],    Date: 10/19/2021  Time Spent: 12:00pm-1:05pm   Treatment Type: Individual Therapy  Pt is seen for a virtual video visit via caregility.  Pt joins from her home,reporting privacy, and counselor from her home office.    Reported Symptoms:  major stressors in past week, more tearful and emotional lability and negative self talk.  Mental Status Exam: Appearance:  Well Groomed     Behavior: Appropriate  Motor: Normal  Speech/Language:  Normal Rate  Affect: Appropriate  Mood: anxious and sad  Thought process: normal  Thought content:   WNL  Sensory/Perceptual disturbances:   WNL  Orientation: oriented to person, place, time/date, and situation  Attention: Good  Concentration: Good  Memory: WNL  Fund of knowledge:  Good  Insight:   Good  Judgment:  Good  Impulse Control: Good   Risk Assessment: Danger to Self:  No Self-injurious Behavior: No Danger to Others: No Duty to Warn:no Physical Aggression / Violence:No  Access to Firearms a concern: No  Gang Involvement:No   Subjective: Counselor assessed pt current functioning per pt report.  Processed w/pt significant recent stressors and validated worries.  Assisted pt in recognizing supports and her self care and connection in relationship.  Discussed self talk and practicing self compassion.  Discussed self care plan.  Pt affect congruent w/ report of anxiety and sadness. Pt tearful at times. Pt reported that her partner's employer didn't fulfill payroll for a week now.  Pt reported that her partner is traveling for work internationally and extended for a week longer.  Pt is struggling w/ feeling alone, struggle w/ financial stressors of not having pay anticipated and uncertainty of when resolved.  Pt also reports supervisor informed of some complaints coworkers had and worries that her job is in jeopardy.  Pt was able to  acknowledge support she has had, positives of being able to talk w/ partner daily and how resolved some challenges presented over past week.  Pt recognized negative self talk and to practice kindness to self.  Pt discussed things doing for self care that helping her.     Interventions: Cognitive Behavioral Therapy, Assertiveness/Communication, and self compassion and supportive  Diagnosis:Generalized anxiety disorder  Major depressive disorder, recurrent episode, moderate (HCC)  Plan: Pt to f/u in 2 weeks for counseling.  Pt to f/u as scheduled w/ PCP and urologist.  Pt reports will f/u w/ [facility] pt accounting.  Pt reports will call for sooner appt if needed w/ counselor.   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  Client Treatment Preferences  biweekly counseling. continue medication management.  Client Statement of Needs  Pt "mainly to figure out a better way to deal w/ my anxiety as comes up and to communicate my concerns so that not coming off as accusatory and be more confident in myself."  Treatment Level  outpatient counseling  Symptoms  Depressed or irritable mood.: No Description Entered (Status: maintained). Excessive and/or unrealistic worry that is difficult to control occurring more days than not for at least 6 months about a number of events or activities.: No Description Entered (Status: maintained). Ineffective communication: No Description Entered (Status: maintained).  Problems Addressed  Anxiety, Unipolar Depression  Goals 1. Enhance ability to effectively cope with the full variety of life's worries and anxieties. Objective  Learn and implement personal and interpersonal skills to reduce anxiety and improve interpersonal relationships. Target Date: 2022-04-11 Frequency: Daily  Progress: 0 Modality:  individual  Objective Learn and implement calming skills to reduce overall anxiety and manage anxiety symptoms. Target Date: 2022-04-11 Frequency: Daily  Progress: 0 Modality: individual  Objective Identify, challenge, and replace biased, fearful self-talk with positive, realistic, and empowering self-talk. Target Date: 2022-04-11 Frequency: Daily  Progress: 0 Modality: individual  2. Recognize, accept, and cope with feelings of depression. Objective Identify and replace thoughts and beliefs that support depression. Target Date: 2022-04-11 Frequency: Daily  Progress: 0 Modality: individual  Related Interventions Conduct Cognitive-Behavioral Therapy (see Cognitive Behavior Therapy by Olevia Bowens; Overcoming Depression by Lynita Lombard al.), beginning with helping the client learn the connection among cognition, depressive feelings, and actions. Objective Increasingly verbalize hopeful and positive statements regarding self, others, and the future. Target Date: 2022-04-11 Frequency: Daily  Progress: 0 Modality: individual  Related Interventions Assign the client to write at least one positive affirmation statement daily regarding himself/herself and the future (or assign "Positive Self-Talk" in the Adult Psychotherapy Homework Planner by Bryn Gulling). Pt participated in developing plan and provider verbal consent.    Jan Fireman, [facility]

## 2021-10-22 ENCOUNTER — Encounter: Payer: Self-pay | Admitting: *Deleted

## 2021-10-30 ENCOUNTER — Ambulatory Visit (INDEPENDENT_AMBULATORY_CARE_PROVIDER_SITE_OTHER): Payer: Self-pay | Admitting: Psychology

## 2021-10-30 DIAGNOSIS — F331 Major depressive disorder, recurrent, moderate: Secondary | ICD-10-CM

## 2021-10-30 DIAGNOSIS — F411 Generalized anxiety disorder: Secondary | ICD-10-CM

## 2021-10-30 NOTE — Progress Notes (Signed)
[facility] Counselor/Therapist Progress Note  Patient ID: Kara [name], MRN: [id number],    Date: 10/30/2021  Time Spent: 12:00pm-12:54pm   Treatment Type: Individual Therapy  Pt is seen for a virtual video visit via caregility.  Pt joins from her office, reporting privacy, and counselor from her home office.    Reported Symptoms:  continued major stressors in past week, decreased tearfulness, focus on self care and encouraging self talk.  Mental Status Exam: Appearance:  Well Groomed     Behavior: Appropriate  Motor: Normal  Speech/Language:  Normal Rate  Affect: Appropriate  Mood: anxious and sad  Thought process: normal  Thought content:   WNL  Sensory/Perceptual disturbances:   WNL  Orientation: oriented to person, place, time/date, and situation  Attention: Good  Concentration: Good  Memory: WNL  Fund of knowledge:  Good  Insight:   Good  Judgment:  Good  Impulse Control: Good   Risk Assessment: Danger to Self:  No Self-injurious Behavior: No Danger to Others: No Duty to Warn:no Physical Aggression / Violence:No  Access to Firearms a concern: No  Gang Involvement:No   Subjective: Counselor assessed pt current functioning per pt report.  Processed w/pt significant stressors w/ spouse losing job. Validated pt emotions w/ stressos. Assisted pt in recognizing supports and focus on her self care plan to cope through.  Discussed resources. Encouraged pt to reach out if needed.  Pt affect wnl.  Pt reported that her spouse was fired while overseas and still hasn't received paycheck or back pay.  Pt reported that he is safely home and has been job Psychologist, educational and exploring options w/ department of labor.  Pt discussed stressors at work and positive feedback received.  Pt is not taking on tasks outside of her own job and her own needs.  Pt discussed her self care plan and how being intentional about each day.  Pt discussed need to pause on f/u as currently uninsured.   Pt increased awareness of resources in the area.  Pt informed will keep counselor informed.     Interventions: Cognitive Behavioral Therapy, Assertiveness/Communication, and self compassion and supportive  Diagnosis:Generalized anxiety disorder  Major depressive disorder, recurrent episode, moderate (HCC)  Plan: Pt informed need to pause of f/u due to lapse in insurance. Pt informed about [facility] in [employer] and services available for uninsured and reports will f/u w/ if needed.  Pt to keep therapist informed.   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  Client Treatment Preferences  biweekly counseling. continue medication management.  Client Statement of Needs  Pt "mainly to figure out a better way to deal w/ my anxiety as comes up and to communicate my concerns so that not coming off as accusatory and be more confident in myself."  Treatment Level  outpatient counseling  Symptoms  Depressed or irritable mood.: No Description Entered (Status: maintained). Excessive and/or unrealistic worry that is difficult to control occurring more days than not for at least 6 months about a number of events or activities.: No Description Entered (Status: maintained). Ineffective communication: No Description Entered (Status: maintained).  Problems Addressed  Anxiety, Unipolar Depression  Goals 1. Enhance ability to effectively cope with the full variety of life's worries and anxieties. Objective Learn and implement personal and interpersonal skills to reduce anxiety and improve interpersonal relationships. Target Date: 2022-04-11 Frequency: Daily  Progress: 0  Modality: individual  Objective Learn and implement calming skills to reduce overall anxiety and manage anxiety symptoms. Target Date: 2022-04-11 Frequency: Daily   Progress: 0 Modality: individual  Objective Identify, challenge, and replace biased, fearful self-talk with positive, realistic, and empowering self-talk. Target Date: 2022-04-11 Frequency: Daily  Progress: 0 Modality: individual  2. Recognize, accept, and cope with feelings of depression. Objective Identify and replace thoughts and beliefs that support depression. Target Date: 2022-04-11 Frequency: Daily  Progress: 0 Modality: individual  Related Interventions Conduct Cognitive-Behavioral Therapy (see Cognitive Behavior Therapy by Olevia Bowens; Overcoming Depression by Lynita Lombard al.), beginning with helping the client learn the connection among cognition, depressive feelings, and actions. Objective Increasingly verbalize hopeful and positive statements regarding self, others, and the future. Target Date: 2022-04-11 Frequency: Daily  Progress: 0 Modality: individual  Related Interventions Assign the client to write at least one positive affirmation statement daily regarding himself/herself and the future (or assign "Positive Self-Talk" in the Adult Psychotherapy Homework Planner by Bryn Gulling). Pt participated in developing plan and provider verbal consent.    Jan Fireman, [facility]

## 2021-11-07 ENCOUNTER — Other Ambulatory Visit: Payer: Self-pay | Admitting: Family Medicine

## 2021-11-13 ENCOUNTER — Ambulatory Visit: Payer: 59 | Admitting: Psychology

## 2021-11-27 ENCOUNTER — Ambulatory Visit: Payer: 59 | Admitting: Psychology

## 2022-01-04 ENCOUNTER — Encounter: Payer: Managed Care, Other (non HMO) | Admitting: Family Medicine

## 2022-01-10 ENCOUNTER — Encounter: Payer: Self-pay | Admitting: *Deleted

## 2022-05-08 ENCOUNTER — Ambulatory Visit (HOSPITAL_COMMUNITY)
Admission: EM | Admit: 2022-05-08 | Discharge: 2022-05-08 | Disposition: A | Discharge status: Home / Self Care | Payer: No Payment, Other | Attending: Psychiatry | Admitting: Psychiatry

## 2022-05-08 ENCOUNTER — Telehealth (HOSPITAL_COMMUNITY): Payer: Self-pay | Admitting: Professional

## 2022-05-08 DIAGNOSIS — Z630 Problems in relationship with spouse or partner: Secondary | ICD-10-CM | POA: Insufficient documentation

## 2022-05-08 DIAGNOSIS — Z79899 Other long term (current) drug therapy: Secondary | ICD-10-CM | POA: Insufficient documentation

## 2022-05-08 DIAGNOSIS — F322 Major depressive disorder, single episode, severe without psychotic features: Secondary | ICD-10-CM

## 2022-05-08 DIAGNOSIS — F329 Major depressive disorder, single episode, unspecified: Secondary | ICD-10-CM | POA: Insufficient documentation

## 2022-05-08 DIAGNOSIS — R45851 Suicidal ideations: Secondary | ICD-10-CM | POA: Insufficient documentation

## 2022-05-08 NOTE — ED Provider Notes (Signed)
Behavioral Health Urgent Care Medical Screening Exam  Patient Name: Kara [name] MRN: [id number] Date of Evaluation: 05/08/22 Chief Complaint:   Diagnosis:  Final diagnoses:  Current moderate episode of major depressive disorder, unspecified whether recurrent    History of Present illness: Kara [name] is a 47 y.o. female.  Presented to [facility] due to worsening depression and passive suicidal ideations.  States " I do not have any thoughts to harm myself.  I just wish I was not here somedays."  Reports multiple stressors related to her work environment and family dynamics.  Reports she is currently employed by the [employer].  Today, she reported that somebody was in acute distress when she called to get the patient some assistance.  States onlooker stated that she was just sitting down not helping.  States that triggered her depression " I take pride in my work, and I am always willing to help."    Additional she reports she may be on the verge of a divorce.  States her husband of 42 = years has transition from female to  female.  Reports she has been supportive through his/her transition however, she reported that he/she currently has feelings for somebody else.  States she allowed her husband's love interest Diane to moving with them roughly 4 to 5 months ago.  Reports she is currently not paying her her end of the bills so she is going to probably have to file for an eviction which will probably make her husband/wife move out and be upset with her.   Kara [name] reports she was followed by therapy and psychiatry in the past.  States she was followed by [facility] however that been unable to afford services due to loss of insurance.  Reports her primary care provider has been prescribing her antidepressants.  She reports she has been taking and tolerating them well.   Kara [name] is sitting in no acute distress. She is alert/oriented x 4; calm/cooperative; and mood  congruent with affect. She is speaking in a clear tone at moderate volume, and normal pace; with good eye contact. Her thought process is coherent and relevant; There is no indication that she is currently responding to internal/external stimuli or experiencing delusional thought content; and she has denied suicidal/self-harm/homicidal ideation, psychosis, and paranoia. Patient has remained calm throughout assessment and has answered questions appropriately.     [name] is educated and verbalizes understanding of mental health resources and other crisis services in the community.She is instructed to call 911 and present to the nearest emergency room should she experience any suicidal/homicidal ideation, auditory/visual/hallucinations, or detrimental worsening of her mental health condition. She was a also advised by Clinical research associate that she could call the toll-free phone on insurance card to assist with identifying in network counselors and agencies or number on back of Medicaid card t speak with care coordinator   Flowsheet Row ED from 05/08/2022 in [facility]  C-SSRS RISK CATEGORY No Risk       Psychiatric Specialty Exam  Presentation  General Appearance:Appropriate for Environment  Eye Contact:Good  Speech:Clear and Coherent  Speech Volume:Normal  Handedness:Right   Mood and Affect  Mood: Depressed; Anxious  Affect: Congruent   Thought Process  Thought Processes: Coherent  Descriptions of Associations:Intact  Orientation:Full (Time, Place and Person)  Thought Content:Logical    Hallucinations:None  Ideas of Reference:None  Suicidal Thoughts:Yes, Passive Without Intent; Without Plan  Homicidal Thoughts:No   Sensorium  Memory: Immediate Fair; Recent  Fair  Judgment: Fair  Insight: Fair   Chartered certified accountant: Good  Attention Span: Good  Recall: Kara [name] of  Knowledge: Good  Language: Good   Psychomotor Activity  Psychomotor Activity: Normal   Assets  Assets: Social Support   Sleep  Sleep: Fair  Number of hours: No data recorded  Physical Exam: Physical Exam Vitals and nursing note reviewed.  Cardiovascular:     Rate and Rhythm: Normal rate and regular rhythm.  Neurological:     Mental Status: She is alert and oriented to person, place, and time.  Psychiatric:        Mood and Affect: Mood normal.        Thought Content: Thought content normal.    Review of Systems  Psychiatric/Behavioral:  Positive for depression. Suicidal ideas: passive ideation.The patient is nervous/anxious.   All other systems reviewed and are negative.  Blood pressure 134/80, pulse 86, temperature 97.9 F (36.6 C), temperature source Oral, resp. rate 18, SpO2 99 %. There is no height or weight on file to calculate BMI.  Musculoskeletal: Strength & Muscle Tone: within normal limits Gait & Station: normal Patient leans: N/A   [facility] MSE Discharge Disposition for Follow up and Recommendations: Based on my evaluation the patient does not appear to have an emergency medical condition and can be discharged with resources and follow up care in outpatient services for Partial Hospitalization Program and Individual Therapy   [name], NP 05/08/2022, 1:53 PM

## 2022-05-08 NOTE — Progress Notes (Signed)
   05/08/22 1244  [facility] Triage Screening (Walk-ins at [facility] only)  What Is the Reason for Your Visit/Call Today? Kara [name] is a 47 year old female presenting to [facility] voluntarily with chief complaint of increased stress and worsening depression. Pt reports a situation happened at work today that pushed over edge. Pt reports that her marriage is falling apart and she is worried about losing her house. Pt reports last week she was passively suicidal no plan however feels like "people would be happier if I wasn't around". Pt does not have outpatient services and reports that he PCP is prescribing her fluoxetine. Pt was receiving therapy last year until her partner lost their insurance. Pt was being treated for GAD and clinical depression. Denies SI, HI, AVH, drug and alcohol use. Prior suicide attempt in late teens.  How Long Has This Been Causing You Problems? > than 6 months  Have You Recently Had Any Thoughts About Hurting Yourself? No  Are You Planning to Commit Suicide/Harm Yourself At This time? No  Have you Recently Had Thoughts About Hurting Someone [name]? No  Are You Planning To Harm Someone At This Time? No  Are you currently experiencing any auditory, visual or other hallucinations? No  Have You Used Any Alcohol or Drugs in the Past 24 Hours? No  Do you have any current medical co-morbidities that require immediate attention? No  Clinician description of patient physical appearance/behavior: tearful  What Do You Feel Would Help You the Most Today? Treatment for Depression or other mood problem  If access to [facility] was not available, would you have sought care in the Emergency Department? No  Determination of Need Routine (7 days)  Options For Referral Medication Management;Outpatient Therapy;Intensive Outpatient Therapy

## 2022-05-08 NOTE — Discharge Instructions (Signed)

## 2022-05-08 NOTE — Discharge Summary (Signed)
Kara [name] to be D/C'd Home per NP order. An After Visit Summary was printed and given to the patient by provider. Patient escorted out and D/C home via private auto.  Dickie La  05/08/2022 2:14 PM

## 2022-05-13 ENCOUNTER — Encounter: Payer: Self-pay | Admitting: Family Medicine

## 2022-05-27 ENCOUNTER — Other Ambulatory Visit: Payer: Self-pay

## 2022-05-27 MED ORDER — TRAMADOL HCL 50 MG PO TABS: ORAL_TABLET | ORAL | 5 refills | Status: AC

## 2022-05-27 NOTE — Progress Notes (Signed)
sent 

## 2022-05-29 ENCOUNTER — Encounter: Payer: Self-pay | Admitting: Family Medicine

## 2022-06-06 ENCOUNTER — Other Ambulatory Visit (HOSPITAL_COMMUNITY): Payer: Self-pay

## 2022-06-10 NOTE — Telephone Encounter (Signed)
Rx team please see below, pt is needing PA for Tramadol.

## 2022-06-11 ENCOUNTER — Other Ambulatory Visit (HOSPITAL_COMMUNITY): Payer: Self-pay

## 2022-06-11 ENCOUNTER — Telehealth: Payer: Self-pay

## 2022-06-11 NOTE — Telephone Encounter (Signed)
Initiated PA via CMM.  Key: [id number]. Came back prior authorization is not required at this time. Patient can get a 7 day supply. Called insurance at [phone] and representative will fax over PA form. Will complete and fax back.

## 2022-06-17 ENCOUNTER — Ambulatory Visit (HOSPITAL_COMMUNITY): Payer: 59 | Admitting: Mental Health

## 2022-06-21 NOTE — Telephone Encounter (Signed)
Pharmacy Patient Advocate Encounter  Received notification from [employer] that the request for prior authorization for Tramadol 50mg  has been denied due to not meeting the requirements -       Please be advised we currently do not have a Pharmacist to review denials, therefore you will need to process appeals accordingly as needed. Thanks for your support at this time.   You may fax [phone] to appeal.

## 2022-06-25 ENCOUNTER — Other Ambulatory Visit (HOSPITAL_COMMUNITY): Payer: Self-pay

## 2022-06-25 NOTE — Telephone Encounter (Signed)
Patient Advocate Encounter   Per test claim, insurance terminated on 05/22/22. Placed a call to  [phone] to check eligability, and no benefits were found.

## 2022-06-26 ENCOUNTER — Other Ambulatory Visit: Payer: Self-pay

## 2022-06-26 MED ORDER — LEVOTHYROXINE SODIUM 125 MCG PO TABS: ORAL_TABLET | ORAL | 3 refills | Status: DC

## 2022-06-28 ENCOUNTER — Other Ambulatory Visit (HOSPITAL_COMMUNITY): Payer: Self-pay

## 2022-06-28 NOTE — Telephone Encounter (Signed)
Team please call patient to check on this and see if we need to get an updated copy of insurance card-she could also try [facility] for a prescription-not sure if it would be cheaper but possible

## 2022-07-01 NOTE — Telephone Encounter (Signed)
Can you call and obtain updated insurance info from pt please?

## 2022-07-02 ENCOUNTER — Ambulatory Visit: Payer: 59 | Admitting: Psychology

## 2022-07-16 ENCOUNTER — Ambulatory Visit (INDEPENDENT_AMBULATORY_CARE_PROVIDER_SITE_OTHER): Payer: BC Managed Care – PPO | Admitting: Psychology

## 2022-07-16 DIAGNOSIS — F411 Generalized anxiety disorder: Secondary | ICD-10-CM

## 2022-07-16 DIAGNOSIS — F331 Major depressive disorder, recurrent, moderate: Secondary | ICD-10-CM
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
## 2022-07-30 ENCOUNTER — Ambulatory Visit: Payer: BC Managed Care – PPO | Admitting: Psychology

## 2022-07-30 DIAGNOSIS — F411 Generalized anxiety disorder: Secondary | ICD-10-CM | POA: Diagnosis not present

## 2022-07-30 DIAGNOSIS — F331 Major depressive disorder, recurrent, moderate: Secondary | ICD-10-CM | POA: Diagnosis not present

## 2022-07-30 NOTE — Progress Notes (Signed)
[facility] Counselor/Therapist Progress Note  Patient ID: Kara [name], MRN: [id number],    Date: 07/30/2022  Time Spent: 12:00pm-1:00pm   Treatment Type: Individual Therapy  Pt is seen for a virtual video visit via caregility.  Pt consents to virtual visit and is aware of limitations of virtual visits.  Pt joins from her office, reporting privacy, and counselor from her home office.    Reported Symptoms:  marital stressors in past 2 weeks, anxiety and sadness about interactions and stressors.  Mental Status Exam: Appearance:  Well Groomed     Behavior: Appropriate  Motor: Normal  Speech/Language:  Normal Rate  Affect: Appropriate  Mood: anxious and sad  Thought process: normal  Thought content:   WNL  Sensory/Perceptual disturbances:   WNL  Orientation: oriented to person, place, time/date, and situation  Attention: Good  Concentration: Good  Memory: WNL  Fund of knowledge:  Good  Insight:   Good  Judgment:  Good  Impulse Control: Good   Risk Assessment: Danger to Self:  No Self-injurious Behavior: No Danger to Others: No Duty to Warn:no Physical Aggression / Violence:No  Access to Firearms a concern: No  Gang Involvement:No   Subjective: Counselor assessed pt current functioning per pt report.  Processed w/pt recent stressors w/ her marriage.  Validated pt feelings and explored effective communication.  Explored w/pt her wants in relationship and impact of roommate in relationship.  Pt affect congruent w/ mood. Pt expressed anxiety and sadness.  Pt reported stressors in interactions in marriage and how roommate/friend impacting. Pt feels that partner is putting energy towards roommate before and at expense of their relationship.  Pt is unsure if trusts what partner is communicating. Pt is unsure of partners intentions to remain in relationship and to set boundaries w/ roommate as didn't happen over past couple weeks.  Pt also unsure of whether she wants to  remain in relationship and work towards resolution or if being taken advantage of.    Interventions: Cognitive Behavioral Therapy, Assertiveness/Communication, and self compassion and supportive  Diagnosis:Generalized anxiety disorder  Major depressive disorder, recurrent episode, moderate (HCC)  Plan: Pt to f/u in 2 weeks for counseling.  Pt to f/u as scheduled w/ PCP.  Pt to f/u w/ potential couples counseling.      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 diamond painting and mindful coloring are mindful activities for pt.   Client Treatment Preferences  biweekly counseling. continue medication management.   Client Statement of Needs   Pt "I need to get my head back in a good space- to be prepared for whatever may come w/ relationship or other stressors.  Prepare myself to understand ' I am enough for myself'.  If relationship is not going to work be abel to accept. Mentally take care of myself. And be able to rely on myself. "  Treatment Level  outpatient counseling   Symptoms  Depressed or irritable mood. Excessive and/or unrealistic worry that is difficult to control occurring more days than not for at least 6 months about a number of events or activities. Low self worth  Problems Addressed  Anxiety, Unipolar Depression   Goals 1. Enhance ability to effectively cope with the full variety of life's worries and anxieties. Objective Learn and implement personal and interpersonal skills to reduce anxiety and improve interpersonal relationships. Target Date: 2023-07-16 Frequency:  Daily  Progress: 50 Modality: individual  Objective Learn and implement calming skills to reduce overall anxiety and manage anxiety symptoms. Target Date: 2023-07-16 Frequency: Daily  Progress: 50 Modality: individual   Objective Identify, challenge, and replace biased, fearful self-talk with positive, realistic, and empowering self-talk. Target Date: 2023-07-16 Frequency: Daily  Progress: 40 Modality: individual  2. Recognize, accept, and cope with feelings of depression. Objective Identify and replace thoughts and beliefs that support depression. Target Date: 2023-07-16 Frequency: Daily  Progress: 40 Modality: individual  Related Interventions Conduct Cognitive-Behavioral Therapy (see Cognitive Behavior Therapy by Reola Calkins; Overcoming Depression by Agapito Games al.), beginning with helping the client learn the connection among cognition, depressive feelings, and actions. Objective Increasingly verbalize hopeful and positive statements regarding self, others, and the future. Target Date: 2023-07-16 Frequency: Daily  Progress: 40 Modality: individual  Related Interventions Assign the client to write at least one positive affirmation statement daily regarding himself/herself and the future (or assign "Positive Self-Talk" in the Adult Psychotherapy Homework Planner by Stephannie Li). Pt participated in developing plan and provider verbal consent.      Forde Radon [facility]            [place], [facility]

## 2022-08-12 ENCOUNTER — Ambulatory Visit: Payer: BC Managed Care – PPO | Admitting: Psychology

## 2022-08-12 DIAGNOSIS — F411 Generalized anxiety disorder: Secondary | ICD-10-CM | POA: Diagnosis not present

## 2022-08-12 DIAGNOSIS — F331 Major depressive disorder, recurrent, moderate: Secondary | ICD-10-CM

## 2022-08-12 NOTE — Progress Notes (Signed)
[facility] Counselor/Therapist Progress Note  Patient ID: Kara [name], MRN: [id number],    Date: 08/12/2022  Time Spent: 12:03pm-12:53pm   Treatment Type: Individual Therapy  Pt is seen for a virtual video visit via caregility.  Pt consents to virtual visit and is aware of limitations of virtual visits.  Pt joins from her office, reporting privacy, and counselor from her home office.    Reported Symptoms:  decreased feeling overwhelmed and anxious.  Effective and assertive communication w/ spouse.   Mental Status Exam: Appearance:  Well Groomed     Behavior: Appropriate  Motor: Normal  Speech/Language:  Normal Rate  Affect: Appropriate  Mood: anxious  Thought process: normal  Thought content:   WNL  Sensory/Perceptual disturbances:   WNL  Orientation: oriented to person, place, time/date, and situation  Attention: Good  Concentration: Good  Memory: WNL  Fund of knowledge:  Good  Insight:   Good  Judgment:  Good  Impulse Control: Good   Risk Assessment: Danger to Self:  No Self-injurious Behavior: No Danger to Others: No Duty to Warn:no Physical Aggression / Violence:No  Access to Firearms a concern: No  Gang Involvement:No   Subjective: Counselor assessed pt current functioning per pt report.  Processed w/pt mood and coping skills using.  Explored interactions w/ spouse and communication and asserting needs.  Reflected positive use of coping skills to manage anxiety.  Reflected positive interactions and increase self confidence w/ work.   Pt affect wnl.  Pt reported that her anxiety is decreased and not experiencing sadness.  Pt reported that has been able to have effective communication w/ spouse and discussed boundaries needed.  Pt reports that she has been able to use her coping skills to manage anxiety.  Pt reported that she is back in [place] office working to see if a good fit.  Pt reports feels good to be able to have positive interactions in that  office as well.  Pt recognizes need to continue use of coping skills as decisions being made for potential move w/ spouse in [place] of to [facility].     Interventions: Cognitive Behavioral Therapy, Assertiveness/Communication, and self compassion and supportive  Diagnosis:Generalized anxiety disorder  Major depressive disorder, recurrent episode, moderate (HCC)  Plan: Pt to f/u in 2 weeks for counseling.  Pt to f/u as scheduled w/ PCP.  Pt to f/u w/ potential couples counseling.      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 diamond painting and mindful coloring are mindful activities for pt.   Client Treatment Preferences  biweekly counseling. continue medication management.   Client Statement of Needs   Pt "I need to get my head back in a good space- to be prepared for whatever may come w/ relationship or other stressors.  Prepare myself to understand ' I am enough for myself'.  If relationship is not going to work be abel to accept. Mentally take care of myself. And be able to rely on myself. "  Treatment Level  outpatient counseling   Symptoms  Depressed or irritable mood. Excessive and/or unrealistic worry that is difficult to control occurring more days than not for at least 6 months about a number of events or activities. Low self worth  Problems Addressed  Anxiety, Unipolar Depression   Goals 1. Enhance ability to effectively cope with the full variety of life's  worries and anxieties. Objective Learn and implement personal and interpersonal skills to reduce anxiety and improve interpersonal relationships. Target Date: 2023-07-16 Frequency: Daily  Progress: 50 Modality: individual  Objective Learn and implement calming skills to reduce overall anxiety and manage anxiety symptoms. Target Date:  2023-07-16 Frequency: Daily  Progress: 50 Modality: individual  Objective Identify, challenge, and replace biased, fearful self-talk with positive, realistic, and empowering self-talk. Target Date: 2023-07-16 Frequency: Daily  Progress: 40 Modality: individual  2. Recognize, accept, and cope with feelings of depression. Objective Identify and replace thoughts and beliefs that support depression. Target Date: 2023-07-16 Frequency: Daily  Progress: 40 Modality: individual  Related Interventions Conduct Cognitive-Behavioral Therapy (see Cognitive Behavior Therapy by Reola Calkins; Overcoming Depression by Agapito Games al.), beginning with helping the client learn the connection among cognition, depressive feelings, and actions. Objective Increasingly verbalize hopeful and positive statements regarding self, others, and the future. Target Date: 2023-07-16 Frequency: Daily  Progress: 40 Modality: individual  Related Interventions Assign the client to write at least one positive affirmation statement daily regarding himself/herself and the future (or assign "Positive Self-Talk" in the Adult Psychotherapy Homework Planner by Stephannie Li). Pt participated in developing plan and provider verbal consent.     Forde Radon, [facility]

## 2022-08-27 ENCOUNTER — Ambulatory Visit (INDEPENDENT_AMBULATORY_CARE_PROVIDER_SITE_OTHER): Payer: BC Managed Care – PPO | Admitting: Psychology

## 2022-08-27 DIAGNOSIS — F411 Generalized anxiety disorder: Secondary | ICD-10-CM

## 2022-08-27 DIAGNOSIS — F331 Major depressive disorder, recurrent, moderate: Secondary | ICD-10-CM

## 2022-08-27 NOTE — Progress Notes (Signed)
[facility] Counselor/Therapist Progress Note  Patient ID: Kara [name], MRN: [id number],    Date: 08/27/2022  Time Spent: 12:01pm-1:00pm   Treatment Type: Individual Therapy  Pt is seen for a virtual video visit via caregility.  Pt consents to virtual visit and is aware of limitations of virtual visits.  Pt joins from her office, reporting privacy, and counselor from her home office.    Reported Symptoms:  feeling anxious about upcoming move.  Effective and assertive communication w/ spouse.   Mental Status Exam: Appearance:  Well Groomed     Behavior: Appropriate  Motor: Normal  Speech/Language:  Normal Rate  Affect: Appropriate  Mood: anxious  Thought process: normal  Thought content:   WNL  Sensory/Perceptual disturbances:   WNL  Orientation: oriented to person, place, time/date, and situation  Attention: Good  Concentration: Good  Memory: WNL  Fund of knowledge:  Good  Insight:   Good  Judgment:  Good  Impulse Control: Good   Risk Assessment: Danger to Self:  No Self-injurious Behavior: No Danger to Others: No Duty to Warn:no Physical Aggression / Violence:No  Access to Firearms a concern: No  Gang Involvement:No   Subjective: Counselor assessed pt current functioning per pt report.  Processed w/pt upcoming transition and anxiety about.  Validated and normalized anxiety and reflected positives of not ruminating on and plans making to cope.  Discussed positive communication w/ spouse and boundaries set w/ roommate.   Pt affect wnl.  Pt reported since last session spouse has informed that no longer wants to live in [place] and wants to move back to [facility].  Pt reported that spouse wants her to move w/ him.  Pt reports anxiety about moved and big transition as no supports for her in [place].  Pt also reports that mother in law has been back in forth about her moving to [employer] or giving spouse space.  Pt reported spouse stood up to mom to clarify their relationship is their  decision.  Pt has communicated that roommate will not move in w/ them and spouse agrees.  Pt discussed how she feels good about communication skills and using grounding skills for current anxiety.  They have sold the house and closing is on 10/03/22.  They will live temporarily w/ mom until both established w/ jobs- spouse has interviewed for jobs this week.    Interventions: Cognitive Behavioral Therapy, Assertiveness/Communication, and self compassion and supportive  Diagnosis:Generalized anxiety disorder  Major depressive disorder, recurrent episode, moderate (HCC)  Plan: Pt to f/u in 2 weeks for counseling.  Pt to f/u as scheduled w/ PCP.     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 diamond painting and mindful coloring are mindful activities for pt.   Client Treatment Preferences  biweekly counseling. continue medication management.   Client Statement of Needs   Pt "I need to get my head back in a good space- to be prepared for whatever may come w/ relationship or other stressors.  Prepare myself to understand ' I am enough for myself'.  If relationship is not going to work be abel to accept. Mentally take care of myself. And be able to rely on myself. "  Treatment Level  outpatient counseling   Symptoms  Depressed or irritable mood. Excessive and/or unrealistic worry that is difficult to control occurring more days than not for at  least 6 months about a number of events or activities. Low self worth  Problems Addressed  Anxiety, Unipolar Depression   Goals 1. Enhance ability to effectively cope with the full variety of life's worries and anxieties. Objective Learn and implement personal and interpersonal skills to reduce anxiety and improve interpersonal relationships. Target Date: 2023-07-16  Frequency: Daily  Progress: 50 Modality: individual  Objective Learn and implement calming skills to reduce overall anxiety and manage anxiety symptoms. Target Date: 2023-07-16 Frequency: Daily  Progress: 50 Modality: individual  Objective Identify, challenge, and replace biased, fearful self-talk with positive, realistic, and empowering self-talk. Target Date: 2023-07-16 Frequency: Daily  Progress: 40 Modality: individual  2. Recognize, accept, and cope with feelings of depression. Objective Identify and replace thoughts and beliefs that support depression. Target Date: 2023-07-16 Frequency: Daily  Progress: 40 Modality: individual  Related Interventions Conduct Cognitive-Behavioral Therapy (see Cognitive Behavior Therapy by Reola Calkins; Overcoming Depression by Agapito Games al.), beginning with helping the client learn the connection among cognition, depressive feelings, and actions. Objective Increasingly verbalize hopeful and positive statements regarding self, others, and the future. Target Date: 2023-07-16 Frequency: Daily  Progress: 40 Modality: individual  Related Interventions Assign the client to write at least one positive affirmation statement daily regarding himself/herself and the future (or assign "Positive Self-Talk" in the Adult Psychotherapy Homework Planner by Stephannie Li). Pt participated in developing plan and provider verbal consent.    Forde Radon, [facility]

## 2022-08-30 ENCOUNTER — Encounter: Payer: Self-pay | Admitting: Family Medicine

## 2022-08-30 ENCOUNTER — Ambulatory Visit (INDEPENDENT_AMBULATORY_CARE_PROVIDER_SITE_OTHER): Payer: BC Managed Care – PPO | Admitting: Family Medicine

## 2022-08-30 VITALS — BP 128/84 | HR 81 | Temp 98.2°F | Ht 66.0 in | Wt 319.0 lb

## 2022-08-30 DIAGNOSIS — E785 Hyperlipidemia, unspecified: Secondary | ICD-10-CM

## 2022-08-30 DIAGNOSIS — Z0000 Encounter for general adult medical examination without abnormal findings: Secondary | ICD-10-CM

## 2022-08-30 DIAGNOSIS — F3342 Major depressive disorder, recurrent, in full remission: Secondary | ICD-10-CM

## 2022-08-30 DIAGNOSIS — E559 Vitamin D deficiency, unspecified: Secondary | ICD-10-CM | POA: Diagnosis not present

## 2022-08-30 DIAGNOSIS — Z131 Encounter for screening for diabetes mellitus: Secondary | ICD-10-CM

## 2022-08-30 DIAGNOSIS — M545 Low back pain, unspecified: Secondary | ICD-10-CM | POA: Diagnosis not present

## 2022-08-30 DIAGNOSIS — E039 Hypothyroidism, unspecified: Secondary | ICD-10-CM

## 2022-08-30 LAB — CBC WITH DIFFERENTIAL/PLATELET
Absolute Monocytes: 791 cells/uL (ref 200–950)
Basophils Absolute: 18 cells/uL (ref 0–200)
Basophils Relative: 0.2 %
Eosinophils Absolute: 414 cells/uL (ref 15–500)
Eosinophils Relative: 4.5 %
HCT: 39.9 % (ref 35.0–45.0)
Hemoglobin: 13.5 g/dL (ref 11.7–15.5)
Lymphs Abs: 2594 cells/uL (ref 850–3900)
MCH: 29.4 pg (ref 27.0–33.0)
MCHC: 33.8 g/dL (ref 32.0–36.0)
MCV: 86.9 fL (ref 80.0–100.0)
MPV: 10.6 fL (ref 7.5–12.5)
Monocytes Relative: 8.6 %
Neutro Abs: 5382 cells/uL (ref 1500–7800)
Neutrophils Relative %: 58.5 %
Platelets: 427 10*3/uL — ABNORMAL HIGH (ref 140–400)
RBC: 4.59 10*6/uL (ref 3.80–5.10)
RDW: 13.1 % (ref 11.0–15.0)
Total Lymphocyte: 28.2 %
WBC: 9.2 10*3/uL (ref 3.8–10.8)

## 2022-08-30 MED ORDER — ONDANSETRON 4 MG PO TBDP: 4.0000 mg | ORAL_TABLET | Freq: Three times a day (TID) | ORAL | 0 refills | Status: AC | PRN

## 2022-08-30 MED ORDER — DIAZEPAM 5 MG PO TABS: 5.0000 mg | ORAL_TABLET | Freq: Two times a day (BID) | ORAL | 0 refills | Status: AC | PRN

## 2022-08-30 NOTE — Progress Notes (Signed)
Phone [phone]   Subjective:  Patient presents today for their annual physical. Chief complaint-noted.   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   DEPRESSION 02/22/2010   GERD (gastroesophageal reflux disease)    history of   HYPERLIPIDEMIA 06/11/2007   HYPOTHYROIDISM 08/18/2006   OBESITY 06/16/2008   Pneumonia    hx of    PONV (postoperative nausea and vomiting)    used Scopolamine patch   Patient Active Problem List   Diagnosis Date Noted   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    Priority: High   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    Priority: High   Restless legs syndrome 04/24/2017    Priority: Medium    Osteoarthritis of both knees 04/19/2013    Priority: Medium    Depression 02/22/2010    Priority: Medium    Morbid obesity (HCC) 06/16/2008    Priority: Medium    Hyperlipidemia 06/11/2007    Priority: Medium    Hypothyroidism 08/18/2006    Priority: Medium    Rotator cuff syndrome of right shoulder 02/27/2018    Priority: Low   Allergic rhinitis 02/03/2014    Priority: Low   Vitamin D deficiency 02/03/2014    Priority: Low   BPPV (benign paroxysmal positional vertigo) 04/19/2013    Priority: Low   DJD (degenerative joint disease) 10/26/2009    Priority: Low   Major depressive disorder in partial remission, unspecified whether recurrent (HCC) 06/22/2021   Past Surgical History:  Procedure Laterality Date   CHOLECYSTECTOMY N/A 05/08/2012   Procedure: LAPAROSCOPIC CHOLECYSTECTOMY WITH INTRAOPERATIVE CHOLANGIOGRAM;  Surgeon: Romie Levee, MD;  Location: [facility];  Service: General;  Laterality: N/A;   FRACTURE SURGERY      GASTRIC ROUX-EN-Y  12/29/2012   Procedure: LAPAROSCOPIC ROUX-EN-Y GASTRIC BYPASS WITH UPPER ENDOSCOPY;  Surgeon: Lodema Pilot, DO;  Location: [facility];  Service: General;;   HARDWARE REMOVAL Right 06/14/2015   Procedure: Removal Internal Fixation Right Lateral Tibial Plateau;  Surgeon: Nadara Mustard, MD;  [facility];  Service: Orthopedics;  Laterality: Right;   HERNIA REPAIR     JOINT REPLACEMENT  July 2017   KNEE ARTHROSCOPY Right 07/28/2012   dr. [name]   ORIF TIBIA PLATEAU Right 03/10/2015   Procedure: OPEN REDUCTION INTERNAL FIXATION (ORIF) LATERAL TIBIAL PLATEAU;  Surgeon: Nadara Mustard, MD;  Location: [facility] OR;  Service: Orthopedics;  Laterality: Right;   ORIF ULNAR FRACTURE Right 01/28/1993   plates and pins   TOTAL KNEE ARTHROPLASTY Right 06/14/2015   TOTAL KNEE ARTHROPLASTY Right 06/14/2015   Procedure: RIGHT TOTAL KNEE ARTHROPLASTY;  Surgeon: Nadara Mustard, MD;  Location: [facility] OR;  Service: Orthopedics;  Laterality: Right;   UPPER GI ENDOSCOPY  12/29/2012   Procedure: UPPER GI ENDOSCOPY;  Surgeon: Lodema Pilot, DO;  Location: [facility];  Service: General;;    Family History  Problem Relation Age of Onset   Schizophrenia Mother    Cancer Mother        lung, smoker   Breast cancer Mother 108   Uterine cancer Mother    Depression Mother    Heart disease Father 48       smoker at  time   Stroke Father     Medications- reviewed and updated Current Outpatient Medications  Medication Sig Dispense Refill   acetaminophen (TYLENOL) 325 MG tablet Take 650 mg by mouth 3 (three) times daily.     calcium carbonate (OS-CAL - DOSED IN MG OF ELEMENTAL CALCIUM) 1250 (500 Ca) MG tablet Take 1 tablet by mouth 3 (three) times daily with meals.     Carbonyl Iron (IRON CHEWS PEDIATRIC PO) Take 1 tablet by mouth daily.     cholecalciferol (VITAMIN D3) 25 MCG (1000 UNIT) tablet Take 5,000 Units by mouth daily.     gabapentin (NEURONTIN) 600 MG tablet Take 600 mg by mouth 3 (three) times daily.  Through [facility] sc[facility] center     glucosamine-chondroitin 500-400 MG tablet Take 1 tablet by mouth daily.     levocetirizine (XYZAL) 5 MG tablet Take 5 mg by mouth every evening.     levothyroxine (SYNTHROID) 125 MCG tablet take 2 tablets Monday - thursday and 1 tablet 3 other days of the week 145 tablet 3   Multiple Vitamins-Minerals (BARIATRIC MULTIVITAMINS/IRON PO) Take 1 tablet by mouth 2 (two) times daily.     omeprazole (PRILOSEC) 40 MG capsule Take 1 capsule (40 mg total) by mouth daily. 30 capsule 3   traMADol (ULTRAM) 50 MG tablet TAKE 1 TABLET(50 MG) BY MOUTH THREE TIMES DAILY AS NEEDED 90 tablet 5   venlafaxine XR (EFFEXOR-XR) 150 MG 24 hr capsule TAKE 1 CAPSULE(150 MG) BY MOUTH DAILY WITH BREAKFAST 90 capsule 3   vitamin B-12 (CYANOCOBALAMIN) 500 MCG tablet Take 500 mcg by mouth daily.     diazepam (VALIUM) 5 MG tablet Take 1 tablet (5 mg total) by mouth every 12 (twelve) hours as needed (dizziness). (Patient not taking: Reported on 08/30/2022) 20 tablet 0   ondansetron (ZOFRAN ODT) 4 MG disintegrating tablet Take 1 tablet (4 mg total) by mouth every 8 (eight) hours as needed for nausea or vomiting. (Patient not taking: Reported on 08/30/2022) 20 tablet 0   No current facility-administered medications for this visit.    Allergies-reviewed and updated Allergies  Allergen Reactions   Asa [Aspirin] Itching and Swelling    Social History   Social History Narrative   Married (spouse transgender female to female). No children. 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   Prior [facility]. Also trained Museum/gallery conservator.       Hobbies: enjoys [place] [employer], nascar, working out   Objective  Objective:  BP 128/84   Pulse 81   Temp 98.2 F (36.8 C)   Ht 5\' 6"  (1.676 m)   Wt (!) 319 lb (144.7 kg)   SpO2 98%   BMI 51.49 kg/m  Gen: NAD, resting comfortably HEENT: Mucous  membranes are moist. Oropharynx normal Neck: no thyromegaly CV: RRR no murmurs rubs or gallops Lungs: CTAB no crackles, wheeze, rhonchi Abdomen: soft/nontender/nondistended/normal bowel sounds. No rebound or guarding.  Ext: trace to 1+ edema Skin: warm, dry Neuro: grossly normal, moves all extremities, PERRLA   Assessment and Plan   47 y.o. female presenting for annual physical.  Health Maintenance counseling: 1. Anticipatory guidance: Patient counseled regarding regular dental exams -q6 months, eye exams - yearly,  avoiding smoking and second hand smoke , limiting alcohol to 1 beverage per day- doesn't drink , no illicit drugs .   2. Risk factor  reduction:  Advised patient of need for regular exercise and diet rich and fruits and vegetables to reduce risk of heart attack and stroke.  Exercise- at current job wasn't able to do the cubi- tries to get up and move as best she can with joint limitatoins.  Diet/weight management-weight down 8 pounds from last formal physical and 9 pounds from last visit. Morbid obesity status noted but improving. Has tried to work on mindfulness with eating- enouraged continued progress- history of bypass in 2014 Wt Readings from Last 3 Encounters:  08/30/22 (!) 319 lb (144.7 kg)  06/22/21 (!) 328 lb 3.2 oz (148.9 kg)  12/13/20 (!) 327 lb 6.4 oz (148.5 kg)  3. Immunizations/screenings/ancillary studies-consider fall flu shot and COVID vaccination  Immunization History  Administered Date(s) Administered   Influenza Split 11/08/2010   Influenza Whole 10/29/2006   Influenza,inj,Quad PF,6+ Mos 10/20/2012, 11/11/2013, 10/10/2014, 03/26/2017, 01/26/2018, 10/28/2018, 11/30/2019   PFIZER(Purple Top)SARS-COV-2 Vaccination 04/13/2019, 05/11/2019, 12/03/2019   PNEUMOCOCCAL CONJUGATE-20 12/13/2020   Td 12/18/2006   Tdap 04/24/2017   4. Cervical cancer screening- pap smear 05/18/18 with 3 year repeat planned with GYN Dr.Lowe- states had another in 2021- apparently doing  yearly  for most part- plans to establish in [place]- ran out of insurance for a while 5. Breast cancer screening-  breast exam with GYN and mammogram- has gotten pushed back encouraged to do when she moves  6. Colon cancer screening - Cologuard 07/03/20 - good for 3 years  7. Skin cancer screening- No dermatologist.  advised regular sunscreen use. Denies worrisome, changing, or new skin lesions.  8. Birth control/STD check- monogamous.  previously On Depo-Provera for heavy periods in the past- came off and no issues. Last cycle was in 1996- none since stopping depo provera 2 years ago- some hot fashes wonders about menopause 9. Osteoporosis screening at 35- will likely do at some point after moving to [place] at later date   -Former smoker- quit 2007. gets UAs with gynecology in the past-no regular screening required with under 20 pack years- under 10 pack years   Status of chronic or acute concerns   # Social update-moving to [place] next month-asked her to let us know once she established with a new physician and thanked her for the time as her physician and also asked her to let Alvester Morin know that we wish them both the best  -going to be tough on her as won't be as close to some family and friends  #hyperlipidemia S: Medication:None -ASCVD risk has been below 2% Lab Results  Component Value Date   CHOL 211 (H) 06/22/2021   HDL 53 06/22/2021   LDLCALC 132 (H) 06/22/2021   LDLDIRECT 96.0 05/23/2014   TRIG 151 (H) 06/22/2021   CHOLHDL 4.0 06/22/2021   A/P:  hopefully stable- update lipid panel today. Continue without meds for now as long as risk remains low   # Depression S: Medication: Venlafaxine 150mg   Daily -Symptoms were worse with prior employer -Celexa was not effective.  Has done therapy with behavioral health - most recently with Forde Radon, [facility] -did have extremely stressful work situation which led her to use behavioral health urgent care but she stabilized through that     08/30/2022    2:33 PM 06/22/2021    4:00 PM 12/13/2020    7:56 AM  Depression screen PHQ 2/9  Decreased Interest 0 0 0  Down, Depressed, Hopeless 0 0 0  PHQ - 2 Score 0 0 0  Altered sleeping  0  0  Tired, decreased energy 0  0  Change in appetite 0  2  Feeling bad or failure about yourself  0  0  Trouble concentrating 0  0  Moving slowly or fidgety/restless 0  0  Suicidal thoughts 0  0  PHQ-9 Score 0  2  Difficult doing work/chores Not difficult at all  Not difficult at all  A/P: depression In full remission- continue current medications   #hypothyroidism S: compliant On thyroid medication- Levothyroxine  one tablet Friday, Saturday, Sunday TAKE 2 TABLETS BY MOUTH on all other days Lab Results  Component Value Date   TSH 1.33 06/22/2021  A/P:hopefully stable- update tsh today. Continue current meds for now    #Vitamin D deficiency/history of high PTH-improving on calcium and vitamin D S: Medication: 10000 units daily Last vitamin D Lab Results  Component Value Date   VD25OH 27 (L) 06/22/2021  A/P: hopefully improved- update vitamin D today. Continue current meds for now    #Left knee osteoarthritis S: Patient follows with Dr. [name]. Of orthopedic as needed- Right knee -Oral NSAIDs not ideal with gastric bypass -Uses a combination of tramadol and Tylenol.  Aspercreme has worked better for her than Voltaren gel A/P: reasonable control- can refill tramadol while in state but will have to establish once moves   #Spondylosis medication: Gabapentin 600mg  TID through spine and scoliosis center.    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  Kegel exercises have not been helpful.  Myrbetriq did not help . we referred to urology 06/02/20  -on a new medicine through urology helpful   #vertigo in past-  refill diazpeam before she moves- last refill nearly 2 years ago so using very sparingly   Recommended follow up: establish with new doctor in [place] Future Appointments  Date Time Provider Department Center  09/10/2022 12:00 PM Clarene Essex, [facility] None  09/24/2022 12:00 PM Clarene Essex, [facility] None  10/08/2022 12:00 PM Clarene Essex, [facility] None  10/22/2022 12:00 PM Clarene Essex, [facility] None   Lab/Order associations:NOT fasting   ICD-10-CM   1. Hypothyroidism, unspecified type  E03.9     2. Hyperlipidemia, unspecified hyperlipidemia type  E78.5     3. Vitamin D deficiency  E55.9     4. Recurrent major depressive disorder, in full remission (HCC) Chronic F33.42     5. Morbid obesity (HCC) Chronic E66.01       No orders of the defined types were placed in this encounter.   Return precautions advised.  Tana Conch, MD

## 2022-08-30 NOTE — Patient Instructions (Signed)
Health Maintenance Due  Topic Date Due   PAP SMEAR-Modifier  05/17/2021  Team is requesting copy of this  Please stop by lab before you go If you have mychart- we will send your results within 3 business days of [name] receiving them.  If you do not have mychart- we will call you about results within 5 business days of [name] receiving them.  *please also note that you will see labs on mychart as soon as they post. I will later go in and write notes on them- will say "notes from Dr. Durene Cal"   Recommended follow up: try to establish with new doctor as soon as possible - I wish you and Kara [name] the best- we will miss yall!

## 2022-09-01 ENCOUNTER — Encounter: Payer: Self-pay | Admitting: Family Medicine

## 2022-09-02 ENCOUNTER — Other Ambulatory Visit: Payer: Self-pay

## 2022-09-02 ENCOUNTER — Other Ambulatory Visit: Payer: Self-pay | Admitting: Family Medicine

## 2022-09-02 MED ORDER — LEVOTHYROXINE SODIUM 125 MCG PO TABS: ORAL_TABLET | ORAL | 3 refills | Status: AC

## 2022-09-02 MED ORDER — VENLAFAXINE HCL ER 150 MG PO CP24: 150.0000 mg | ORAL_CAPSULE | Freq: Every day | ORAL | 3 refills | Status: AC

## 2022-09-02 NOTE — Progress Notes (Signed)
I am confused-we just filled this on August 2

## 2022-09-10 ENCOUNTER — Ambulatory Visit: Payer: BC Managed Care – PPO | Admitting: Psychology

## 2022-09-10 DIAGNOSIS — F331 Major depressive disorder, recurrent, moderate: Secondary | ICD-10-CM | POA: Diagnosis not present

## 2022-09-10 DIAGNOSIS — F411 Generalized anxiety disorder: Secondary | ICD-10-CM | POA: Diagnosis not present

## 2022-09-10 NOTE — Progress Notes (Signed)
[facility] Counselor/Therapist Progress Note  Patient ID: Kara [name], MRN: [id number],    Date: 09/10/2022  Time Spent: 12:00pm-1:00pm   Treatment Type: Individual Therapy  Pt is seen for a virtual video visit via caregility.  Pt consents to virtual visit and is aware of limitations of virtual visits.  Pt joins from her office, reporting privacy, and counselor from her home office.    Reported Symptoms:  feeling anxious/worry about move, indecisive about move.     Mental Status Exam: Appearance:  Well Groomed     Behavior: Appropriate  Motor: Normal  Speech/Language:  Normal Rate  Affect: Appropriate  Mood: anxious  Thought process: normal  Thought content:   WNL  Sensory/Perceptual disturbances:   WNL  Orientation: oriented to person, place, time/date, and situation  Attention: Good  Concentration: Good  Memory: WNL  Fund of knowledge:  Good  Insight:   Good  Judgment:  Good  Impulse Control: Good   Risk Assessment: Danger to Self:  No Self-injurious Behavior: No Danger to Others: No Duty to Warn:no Physical Aggression / Violence:No  Access to Firearms a concern: No  Gang Involvement:No   Subjective: Counselor assessed pt current functioning per pt report.  Processed w/pt upcoming transition and stressors w/ uncertainty.  Validated and normalized anxiety re: mixed messages and uncertainties.  Explored ways to continue to cope, focus on what is in her control, seek support.   Pt affect congruent w/ worry expressed.  Pt reported that she feels 50/50 on whether to move w/ her partner to [facility] or seek housing for self here.  Pt discussed how this has been impacted by partner emotional escalations, decisions to continue to support roommate, mother in law inconsistent support.  Pt discussed that she has been focusing on what's in her control- looking for jobs, communicating her needs to partner and connecting w/ supports.  Pt also shared grief related to euthanizing  2 pets recently.  Pt is able to reframe distortions and make encouraging self statements.    Interventions: Cognitive Behavioral Therapy, Assertiveness/Communication, and self compassion and supportive  Diagnosis:Generalized anxiety disorder  Major depressive disorder, recurrent episode, moderate (HCC)  Plan: Pt to f/u in 2 weeks for counseling.  Pt to f/u as scheduled w/ PCP.     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 diamond painting and mindful coloring are mindful activities for pt.   Client Treatment Preferences  biweekly counseling. continue medication management.   Client Statement of Needs   Pt "I need to get my head back in a good space- to be prepared for whatever may come w/ relationship or other stressors.  Prepare myself to understand ' I am enough for myself'.  If relationship is not going to work be abel to accept. Mentally take care of myself. And be able to rely on myself. "  Treatment Level  outpatient counseling   Symptoms  Depressed or irritable mood. Excessive and/or unrealistic worry that is difficult to control occurring more days than not for at least 6 months about a number of events or activities. Low self worth  Problems Addressed  Anxiety, Unipolar Depression   Goals 1. Enhance ability to effectively cope with the full variety of life's worries and anxieties. Objective Learn and implement personal and interpersonal skills to reduce anxiety and improve interpersonal relationships. Target Date: 2023-07-16 Frequency: Daily  Progress: 50 Modality: individual  Objective Learn and implement calming skills to reduce overall anxiety and manage anxiety symptoms. Target Date: 2023-07-16 Frequency: Daily  Progress: 50 Modality: individual  Objective Identify, challenge, and  replace biased, fearful self-talk with positive, realistic, and empowering self-talk. Target Date: 2023-07-16 Frequency: Daily  Progress: 40 Modality: individual  2. Recognize, accept, and cope with feelings of depression. Objective Identify and replace thoughts and beliefs that support depression. Target Date: 2023-07-16 Frequency: Daily  Progress: 40 Modality: individual  Related Interventions Conduct Cognitive-Behavioral Therapy (see Cognitive Behavior Therapy by Reola Calkins; Overcoming Depression by Agapito Games al.), beginning with helping the client learn the connection among cognition, depressive feelings, and actions. Objective Increasingly verbalize hopeful and positive statements regarding self, others, and the future. Target Date: 2023-07-16 Frequency: Daily  Progress: 40 Modality: individual  Related Interventions Assign the client to write at least one positive affirmation statement daily regarding himself/herself and the future (or assign "Positive Self-Talk" in the Adult Psychotherapy Homework Planner by Stephannie Li). Pt participated in developing plan and provider verbal consent.      Forde Radon, [facility]

## 2022-09-20 DIAGNOSIS — M1712 Unilateral primary osteoarthritis, left knee: Secondary | ICD-10-CM | POA: Diagnosis not present

## 2022-09-24 ENCOUNTER — Ambulatory Visit (INDEPENDENT_AMBULATORY_CARE_PROVIDER_SITE_OTHER): Payer: BC Managed Care – PPO | Admitting: Psychology

## 2022-09-24 DIAGNOSIS — F324 Major depressive disorder, single episode, in partial remission: Secondary | ICD-10-CM

## 2022-09-24 DIAGNOSIS — F411 Generalized anxiety disorder: Secondary | ICD-10-CM | POA: Diagnosis not present

## 2022-09-24 NOTE — Progress Notes (Signed)
[facility] Counselor/Therapist Progress Note  Patient ID: Kara [name], MRN: [id number],    Date: 09/24/2022  Time Spent: 12:00pm-1:00pm   Treatment Type: Individual Therapy  Pt is seen for a virtual video visit via caregility.  Pt consents to virtual visit and is aware of limitations of virtual visits.  Pt joins from her office, reporting privacy, and counselor from her home office.    Reported Symptoms:  feeling more settled w/ upcoming move, asserting self and focusing on positive self talk.  Mental Status Exam: Appearance:  Well Groomed     Behavior: Appropriate  Motor: Normal  Speech/Language:  Normal Rate  Affect: Appropriate  Mood: anxious  Thought process: normal  Thought content:   WNL  Sensory/Perceptual disturbances:   WNL  Orientation: oriented to person, place, time/date, and situation  Attention: Good  Concentration: Good  Memory: WNL  Fund of knowledge:  Good  Insight:   Good  Judgment:  Good  Impulse Control: Good   Risk Assessment: Danger to Self:  No Self-injurious Behavior: No Danger to Others: No Duty to Warn:no Physical Aggression / Violence:No  Access to Firearms a concern: No  Gang Involvement:No   Subjective: Counselor assessed pt current functioning per pt report.  Processed w/pt upcoming move and related emotions.  validated and normalized anxiety w/ change.  Explored w/pt positives of coping.  Reiterated positive self talk/compassion.  Discussed steps for transitioning care to new area.  Reflected progress made. Pt affect wnl.  Pt reports anxious w/ move and change.  Pt reports she feels more settled as partner has job, they have secured housing, and she has 2 job interviews- one the 3rd stage of interview.  Pt is able to acknowledged negative self talk and reframe.  Pt reported on improvements w/ counseling and thanked counselor for time together.  Pt informed she will update about job obtained and will seek continued services in  new area.    Interventions: Cognitive Behavioral Therapy, Assertiveness/Communication, and self compassion and supportive  Diagnosis:Generalized anxiety disorder  Major depressive disorder in partial remission, unspecified whether recurrent (HCC)  Plan: Pt to f/u w/ seeking counseling in [place] where she is moving.  Pt is working to establish new providers in area and informed she will reach out if needed.     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 diamond painting and mindful coloring are mindful activities for pt.   Client Treatment Preferences  biweekly counseling. continue medication management.   Client Statement of Needs   Pt "I need to get my head back in a good space- to be prepared for whatever may come w/ relationship or other stressors.  Prepare myself to understand ' I am enough for myself'.  If relationship is not going to work be abel to accept. Mentally take care of myself. And be able to rely on myself. "  Treatment Level  outpatient counseling   Symptoms  Depressed or irritable mood. Excessive and/or unrealistic worry that is difficult to control occurring more days than not for at least 6 months about a number of events or activities. Low self worth  Problems Addressed  Anxiety, Unipolar Depression   Goals 1. Enhance ability to effectively cope with the full variety of life's worries and anxieties. Objective Learn and implement personal and interpersonal skills to reduce anxiety and improve interpersonal relationships. Target Date:  2023-07-16 Frequency: Daily  Progress: 50 Modality: individual  Objective Learn and implement calming skills to reduce overall anxiety and manage anxiety symptoms. Target Date: 2023-07-16 Frequency: Daily  Progress: 50 Modality: individual   Objective Identify, challenge, and replace biased, fearful self-talk with positive, realistic, and empowering self-talk. Target Date: 2023-07-16 Frequency: Daily  Progress: 40 Modality: individual  2. Recognize, accept, and cope with feelings of depression. Objective Identify and replace thoughts and beliefs that support depression. Target Date: 2023-07-16 Frequency: Daily  Progress: 40 Modality: individual  Related Interventions Conduct Cognitive-Behavioral Therapy (see Cognitive Behavior Therapy by Reola Calkins; Overcoming Depression by Agapito Games al.), beginning with helping the client learn the connection among cognition, depressive feelings, and actions. Objective Increasingly verbalize hopeful and positive statements regarding self, others, and the future. Target Date: 2023-07-16 Frequency: Daily  Progress: 40 Modality: individual  Related Interventions Assign the client to write at least one positive affirmation statement daily regarding himself/herself and the future (or assign "Positive Self-Talk" in the Adult Psychotherapy Homework Planner by Stephannie Li). Pt participated in developing plan and provider verbal consent.     Forde Radon, [facility]

## 2022-10-08 ENCOUNTER — Ambulatory Visit: Payer: BC Managed Care – PPO | Admitting: Psychology

## 2022-10-11 ENCOUNTER — Encounter: Payer: Self-pay | Admitting: Family Medicine

## 2022-10-11 NOTE — Telephone Encounter (Signed)
Form In your "to sign folder"

## 2022-10-11 NOTE — Telephone Encounter (Signed)
Received faxed document referral to personal healthcare provider , to be filled out by provider. Patient requested to send it back via Fax [phone]. Document is located in providers tray at front office.Please advise at Mobile [phone] (mobile)

## 2022-10-11 NOTE — Telephone Encounter (Signed)
FYI

## 2022-10-22 ENCOUNTER — Ambulatory Visit: Payer: BC Managed Care – PPO | Admitting: Psychology
